# Patient Record
Sex: Female | Born: 1965
Health system: Southern US, Community
[De-identification: ages and names within clinical notes are randomized; demographics above are authoritative.]

## PROBLEM LIST (undated history)

## (undated) DIAGNOSIS — D689 Coagulation defect, unspecified: Secondary | ICD-10-CM

## (undated) DIAGNOSIS — E079 Disorder of thyroid, unspecified: Secondary | ICD-10-CM

## (undated) DIAGNOSIS — Z86010 Personal history of colonic polyps: Secondary | ICD-10-CM

## (undated) DIAGNOSIS — M48061 Spinal stenosis, lumbar region without neurogenic claudication: Secondary | ICD-10-CM

## (undated) DIAGNOSIS — D699 Hemorrhagic condition, unspecified: Secondary | ICD-10-CM

## (undated) DIAGNOSIS — K068 Other specified disorders of gingiva and edentulous alveolar ridge: Secondary | ICD-10-CM

## (undated) DIAGNOSIS — D239 Other benign neoplasm of skin, unspecified: Secondary | ICD-10-CM

## (undated) DIAGNOSIS — Z9289 Personal history of other medical treatment: Secondary | ICD-10-CM

## (undated) DIAGNOSIS — Z800 Family history of malignant neoplasm of digestive organs: Secondary | ICD-10-CM

## (undated) DIAGNOSIS — R42 Dizziness and giddiness: Secondary | ICD-10-CM

## (undated) DIAGNOSIS — G2581 Restless legs syndrome: Secondary | ICD-10-CM

## (undated) DIAGNOSIS — D691 Qualitative platelet defects: Secondary | ICD-10-CM

## (undated) DIAGNOSIS — Z803 Family history of malignant neoplasm of breast: Secondary | ICD-10-CM

## (undated) HISTORY — DX: Qualitative platelet defects: D69.1

## (undated) HISTORY — DX: Other benign neoplasm of skin, unspecified: D23.9

## (undated) HISTORY — DX: Personal history of other medical treatment: Z92.89

## (undated) HISTORY — PX: BREAST BIOPSY: SHX20

## (undated) HISTORY — DX: Other specified disorders of gingiva and edentulous alveolar ridge: K06.8

## (undated) HISTORY — DX: Dizziness and giddiness: R42

## (undated) HISTORY — DX: Disorder of thyroid, unspecified: E07.9

## (undated) HISTORY — DX: Hemorrhagic condition, unspecified: D69.9

## (undated) HISTORY — DX: Family history of malignant neoplasm of breast: Z80.3

## (undated) HISTORY — DX: Personal history of colonic polyps: Z86.010

## (undated) HISTORY — DX: Restless legs syndrome: G25.81

## (undated) HISTORY — DX: Coagulation defect, unspecified: D68.9

## (undated) HISTORY — DX: Spinal stenosis, lumbar region without neurogenic claudication: M48.061

## (undated) HISTORY — DX: Family history of malignant neoplasm of digestive organs: Z80.0

---

## 1993-11-04 HISTORY — PX: NASAL FRACTURE SURGERY: SHX718

## 2000-04-15 ENCOUNTER — Other Ambulatory Visit: Admission: RE | Admit: 2000-04-15 | Discharge: 2000-04-15 | Payer: Self-pay | Admitting: Family Medicine

## 2000-11-11 ENCOUNTER — Other Ambulatory Visit: Admission: RE | Admit: 2000-11-11 | Discharge: 2000-11-11 | Payer: Self-pay | Admitting: Obstetrics & Gynecology

## 2000-11-13 ENCOUNTER — Encounter: Payer: Self-pay | Admitting: Obstetrics & Gynecology

## 2000-11-13 ENCOUNTER — Encounter: Admission: RE | Admit: 2000-11-13 | Discharge: 2000-11-13 | Payer: Self-pay | Admitting: Obstetrics & Gynecology

## 2001-03-13 ENCOUNTER — Encounter: Payer: Self-pay | Admitting: General Surgery

## 2001-03-13 ENCOUNTER — Encounter: Admission: RE | Admit: 2001-03-13 | Discharge: 2001-03-13 | Payer: Self-pay | Admitting: Unknown Physician Specialty

## 2001-04-01 ENCOUNTER — Ambulatory Visit (HOSPITAL_COMMUNITY): Admission: RE | Admit: 2001-04-01 | Discharge: 2001-04-01 | Payer: Self-pay | Admitting: General Surgery

## 2001-04-01 ENCOUNTER — Encounter (INDEPENDENT_AMBULATORY_CARE_PROVIDER_SITE_OTHER): Payer: Self-pay | Admitting: Specialist

## 2002-02-03 ENCOUNTER — Other Ambulatory Visit: Admission: RE | Admit: 2002-02-03 | Discharge: 2002-02-03 | Payer: Self-pay | Admitting: Obstetrics & Gynecology

## 2002-05-28 ENCOUNTER — Observation Stay (HOSPITAL_COMMUNITY): Admission: RE | Admit: 2002-05-28 | Discharge: 2002-05-29 | Payer: Self-pay | Admitting: Obstetrics & Gynecology

## 2002-05-28 ENCOUNTER — Encounter (INDEPENDENT_AMBULATORY_CARE_PROVIDER_SITE_OTHER): Payer: Self-pay

## 2003-11-22 ENCOUNTER — Emergency Department (HOSPITAL_COMMUNITY): Admission: EM | Admit: 2003-11-22 | Discharge: 2003-11-22 | Payer: Self-pay | Admitting: Emergency Medicine

## 2004-11-04 HISTORY — PX: PARTIAL HYSTERECTOMY: SHX80

## 2005-07-22 ENCOUNTER — Encounter: Admission: RE | Admit: 2005-07-22 | Discharge: 2005-07-22 | Payer: Self-pay | Admitting: Family Medicine

## 2005-07-22 ENCOUNTER — Encounter (INDEPENDENT_AMBULATORY_CARE_PROVIDER_SITE_OTHER): Payer: Self-pay | Admitting: *Deleted

## 2005-11-04 HISTORY — PX: BREAST SURGERY: SHX581

## 2006-12-31 ENCOUNTER — Emergency Department (HOSPITAL_COMMUNITY): Admission: EM | Admit: 2006-12-31 | Discharge: 2006-12-31 | Payer: Self-pay | Admitting: Emergency Medicine

## 2009-04-09 ENCOUNTER — Emergency Department (HOSPITAL_COMMUNITY): Admission: EM | Admit: 2009-04-09 | Discharge: 2009-04-09 | Payer: Self-pay | Admitting: Emergency Medicine

## 2009-10-20 ENCOUNTER — Encounter: Admission: RE | Admit: 2009-10-20 | Discharge: 2009-10-20 | Payer: Self-pay | Admitting: Family Medicine

## 2010-11-04 HISTORY — PX: OTHER SURGICAL HISTORY: SHX169

## 2011-02-11 LAB — POCT URINALYSIS DIP (DEVICE)
Bilirubin Urine: NEGATIVE
Glucose, UA: NEGATIVE mg/dL
Specific Gravity, Urine: <=1.005 (ref 1.005–1.030)
Urobilinogen, UA: 0.2 mg/dL (ref 0.0–1.0)
pH: 6 (ref 5.0–8.0)

## 2011-03-22 NOTE — H&P (Signed)
[facility]  Patient:    Deanna [name], Deanna [name] Visit Number: [id number] MRN: [id number]          Service Type: Attending:  Caralyn Guile. Arlyce Dice, M.D. Dictated by:   Caralyn Guile Arlyce Dice, M.D. Adm. Date:  05/28/02                           History and Physical  CHIEF COMPLAINT:              Menometrorrhagia and chronic lower abdominal pain.  HISTORY OF PRESENT ILLNESS:   This is a 45 year old, gravida 2, para 2 female who has a year history of abnormal uterine bleeding and chronic lower abdominal pain.  The patient was treated with birth control pills and this failed to reduce the pain or flow that she ______  In addition, the patient complains of lower abdominal pain that is sometimes worse after intercourse. An ultrasound was performed which showed no pelvic pathology.  The options for treatment were discussed with the patient and they included cryoablation, D&C, a hysteroscopy.  The patient also requests permanent sterilization.  PAST MEDICAL HISTORY:         She has had nasal surgery in 1994 which she hemorrhaged and required three blood transfusions.  She has a history of postpartum thyroiditis.  She is euthyroid at the present time with a thyroid TSH of 1.84 in March 2003.  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  She also has had cryosurgery to her cervix for mild dysplasia and vaginal discharge.  ALLERGIES:                    The patient has no known allergies.  FAMILY HISTORY:               Negative for breast, ovarian, and colon cancer. Her mother does have angina, congestive heart failure, and hypertension.  SOCIAL HISTORY:               She does not smoke cigarettes and does not use any recreational drugs.  REVIEW OF SYSTEMS:            Negative in detail.  PHYSICAL EXAMINATION:  GENERAL:                      She is 5 feet, 5 inches tall, 148 pounds.  HEENT:                        Her ears, nose, and throat are  normal.  NECK:                         Supple without thyromegaly.  BREASTS:                      Without masses.  HEART:                        Regular sinus rhythm without murmurs or gallops.  LUNGS:                        Clear to auscultation and percussion.  ABDOMEN:                      Soft without hepatosplenomegaly.  There was no  lymphadenopathy.  PELVIC:                       Her vagina and vulva were normal.  Cervix appeared normal.  Uterus was tender, top normal size.  Her adnexa were without masses.  IMPRESSION:                   This is a patient with chronic low abdominal pain which is causing her some dyspareunia.  She is a patient who desires permanent sterilization and she is also complaining of menorrhagia and dysmenorrhea that is not responsive to oral contraceptives.  The options were discussed with the patient and the decision was made to proceed with a diagnostic laparoscopy to rule out adnexal disease and then to proceed with a transvaginal hysterectomy for the treatment of her menorrhagia and dysmenorrhea. Dictated by:   Caralyn Guile Arlyce Dice, M.D. Attending:  Caralyn Guile. Arlyce Dice, M.D. DD:  05/26/02 TD:  05/26/02 Job: [id number]

## 2011-03-22 NOTE — Op Note (Signed)
[facility]  Patient:    Deanna [name], Deanna [name]                    MRN: [id number] Proc. Date: 04/01/01 Adm. Date:  [id number] Disc. Date: [id number] Attending:  Caleen Essex                           Operative Report  PREOPERATIVE DIAGNOSIS:  Right axillary lymphadenopathy.  POSTOPERATIVE DIAGNOSIS:  Right axillary lymphadenopathy.  OPERATION:  Excision biopsy of right axillary lymph nodes.  SURGEON:  Chevis Pretty, M.D.  ANESTHESIA:  Local with IV sedation.  DESCRIPTION OF PROCEDURE:  After informed consent was obtained, the patient was brought to the operating room and placed in the supine position on the operating table.  After adequate sedation, the patients right axilla was prepped with Betadine and draped in the usual sterile manner.  The lymph nodes in question were palpable in the axilla.  Overlying this area, the axilla was infiltrated with 0.25% Marcaine.  Once the patient was anesthetized adequately, a small transverse incision was made overlying the palpable axillary nodes.  Dissection was carried out with Bovie electrocautery as well as with blunt dissection with the hemostat until the nodes in question were identified.  The nodes were removed using a combination of sharp dissection with the Bovie electrocautery and serial placement of hemoclips on the lymphatic channels.  Once this was complete and the axillary nodes in questions were removed, the wound was examined for hemostasis.  Several small bleeding points required electrocautery for hemostasis.  The wound was then closed with a deep layer of interrupted 3-0 Vicryl stitches, and the skin as closed with a running 4-0 Monocryl subcuticular stitch.  Benzoin, Steri-Strips, and a sterile dressing were applied.  The patient tolerated the procedure well.  At the end of the case, all sponge, needle, and instrument counts were correct.  The patient was then taken to the recovery room in stable  condition. DD:  04/07/01 TD:  04/08/01 Job: [id number]

## 2011-03-22 NOTE — Op Note (Signed)
Deanna, [name]                       ACCOUNT NO.:  [id number]   MEDICAL RECORD NO.:  [id number]                   PATIENT TYPE:  INP   LOCATION:  9312                                 FACILITY:  [facility]   PHYSICIAN:  RICHARD D. KAPLAN                   DATE OF BIRTH:  1966/02/04   DATE OF PROCEDURE:  05/28/2002  DATE OF DISCHARGE:  05/29/2002                                 OPERATIVE REPORT   PREOPERATIVE DIAGNOSES:  1. Dysmenorrhea.  2. Menorrhagia.  3. Chronic lower abdominal pain.   POSTOPERATIVE DIAGNOSES:  1. Dysmenorrhea.  2. Menorrhagia.  3. Chronic lower abdominal pain.   PROCEDURES:  1. Diagnostic laparoscopy.  2. Transvaginal hysterectomy.   SURGEON:  Richard D. Arlyce Dice, M.D.   ASSISTANT:  Luvenia Redden, M.D.   ANESTHESIA:  General endotracheal .   ESTIMATED BLOOD LOSS:  100 cc.   FINDINGS:  Normal-appearing tubes and ovaries.  No pelvic adhesions.  No  signs of endometriosis.  Normal-appearing appendix.   INDICATIONS:  This is a 45 year old gravida 2, para 2 who, for the last  year, has been complaining of increasingly heavy periods with pain and  occasional lower abdominal pain with dyspareunia.  Evaluation in the office  revealed an ultrasound that showed no pathology.  She was tried on oral  contraceptives which did not succeed in stopping her heavy and painful  periods.  Options were discussed with the patient, including cryoablation of  the cervix, cryoablation of the uterus, and hysteroscopy.  In addition to  these problems, the patient also wanted permanent sterilization.  It was  felt that with her history that the most reasonable approach was to proceed  with hysterectomy for the treatment of her dysmenorrhea and menorrhagia and  to do a diagnostic laparoscopy to ensure that there was no adnexal  pathology.   DESCRIPTION OF PROCEDURE:  The patient was taken to the operating room and  placed in the dorsal lithotomy position after general  endotracheal  anesthesia had been induced.  The abdomen, perineum, and vagina were prepped  and draped in sterile fashion.  An incision was made in the base of the  umbilicus, a Veress needle was introduced into the peritoneal cavity, and a  pneumoperitoneum was created.  A 5 mm trocar was introduced and a 5 mm  laparoscope was introduced into the pneumoperitoneum.  A suprapubic stab  wound was made and another 5 mm port was placed. Through this, a wand was  placed to help manipulate the pelvic organs to view the pelvis.  This was  accomplished and the pelvis was noted to be free of pathology.  The gas was  allowed to escape.  The instruments were left in situ.   The patient was repositioned and prepared for vaginal hysterectomy.  A  weighted speculum was placed in the vagina.  The anterior lip of the cervix  was grasped  with a Jacobs tenaculum and the paracervical tissues were  infiltrated with dilute epinephrine and Marcaine solution.  The mucosa  around the cervix was incised and the mitral valve was dissected free.  The  cul-de-sac was entered posteriorly and the uterosacral ligaments were  clamped, cut, and ligated bilaterally.  The bases of the cardinal ligaments  were then clamped with a ligature clamp, cauterized, and cut.  The anterior  cul-de-sac was then entered and the remaining cardinal ligaments and the  uterine ligaments were clamped with a LigaSure clamp, cauterized, and cut.  The base of the broad ligament was cauterized and cut and then the uterus  was delivered. The remainder of the utero-ovarian anastomosis and round  ligament were progressively clamped, cauterized, and cut with a LigaSure  clamp until the specimen was freed and removed.  Hemostasis was noted to be  present.  The posterior cuff was then closed with a running interlocking #0  Vicryl suture.  The peritoneum was closed with a pursestring suture.  The  uterosacral ligaments were closed together to  obliterate the cul-de-sac and  then tied across the midline.  The anterior vaginal cuff was closed with  interrupted and figure-of-eight Vicryl suture.   At the end of the procedure, the laparoscope was then used to ensure that  the pedicles were dry and they were dry.  The procedure was then terminated.  The laparoscopic incisions were closed with Dermabond.                                               Mickel Baas    RK/MEDQ  D:  05/28/2002  T:  06/03/2002  Job:  [id number]

## 2011-03-22 NOTE — Discharge Summary (Signed)
Deanna [name], Deanna [name]                       ACCOUNT NO.:  [id number]   MEDICAL RECORD NO.:  [id number]                   PATIENT TYPE:  OBV   LOCATION:  9312                                 FACILITY:  [facility]   PHYSICIAN:  Ilda Mori, MD                  DATE OF BIRTH:  1966/07/08   DATE OF ADMISSION:  05/28/2002  DATE OF DISCHARGE:  05/29/2002                                 DISCHARGE SUMMARY   FINAL DIAGNOSES:  1. Dyspareunia.  2. Dysmenorrhea.  3. Menorrhagia.   SECONDARY DIAGNOSES:  None.   PROCEDURE:  1. Transvaginal hysterectomy.  2. Diagnostic laparoscopy.   COMPLICATIONS:  None.   CONDITION ON DISCHARGE:  Improved.   HISTORY OF PRESENT ILLNESS:  This is a 45 year old gravida 2, para 2 with an  approximately 10-12 month history of increasingly severe heavy periods with  pain with her periods and chronic low abdominal pain associated with  intercourse.  She was evaluated in the office where ultrasound was performed  which showed no definite pathology.  She was __________ oral contraceptives  which did not succeed in alleviating her symptoms.  Because of the  persistence of the symptoms and the fact that medical therapy was  unsuccessful, decision was made to proceed with diagnostic laparoscopy to  rule out adnexal disease and then transvaginal hysterectomy for the  treatment of her menorrhagia and dysmenorrhea.  She was taken to the  operating room on the day of admission where a diagnostic laparoscopy was  performed which revealed no adnexal pathology.  A transvaginal hysterectomy  was then performed without complication.  The patient was observed for 24  hours postoperatively and then discharged to home.  She was discharged home  on a regular diet, told to limit her activity.  She was given Tylox 25  tablets to take one to two q.4h. for pain and asked to return to the office  in two weeks for follow-up evaluation.   LABORATORY DATA:  Admission hemoglobin of  13.4 and a white count of 7200.  Postoperatively her hemoglobin was 10.1 with a white count of 7300.  Prior  to discharge on postoperative day one her hemoglobin was repeated 12 hours  later and remained stable at 10.3 with a white count of 6000.  Routine  chemistries were performed which were all normal.  Serum pregnancy test was  negative.  A urinalysis was benign.  Pathology on the uterus revealed  nonspecific chronic erosive cervicitis, benign secretory endometrium,  myometria and serosa with no pathologic abnormalities.                                               Ilda Mori, MD    RK/MEDQ  D:  06/22/2002  T:  06/22/2002  Job:  [id number]

## 2013-02-11 ENCOUNTER — Ambulatory Visit (INDEPENDENT_AMBULATORY_CARE_PROVIDER_SITE_OTHER): Payer: 59 | Admitting: Family Medicine

## 2013-02-11 VITALS — BP 147/81 | HR 81 | Temp 99.2°F | Resp 16 | Ht 64.5 in | Wt 207.0 lb

## 2013-02-11 DIAGNOSIS — R269 Unspecified abnormalities of gait and mobility: Secondary | ICD-10-CM

## 2013-02-11 DIAGNOSIS — K068 Other specified disorders of gingiva and edentulous alveolar ridge: Secondary | ICD-10-CM

## 2013-02-11 DIAGNOSIS — K055 Other periodontal diseases: Secondary | ICD-10-CM

## 2013-02-11 DIAGNOSIS — R002 Palpitations: Secondary | ICD-10-CM

## 2013-02-11 DIAGNOSIS — R5381 Other malaise: Secondary | ICD-10-CM

## 2013-02-11 DIAGNOSIS — R2681 Unsteadiness on feet: Secondary | ICD-10-CM

## 2013-02-11 DIAGNOSIS — R5383 Other fatigue: Secondary | ICD-10-CM

## 2013-02-11 LAB — POCT URINALYSIS DIPSTICK
Bilirubin, UA: NEGATIVE
Ketones, UA: NEGATIVE
Nitrite, UA: NEGATIVE
Urobilinogen, UA: 0.2
pH, UA: 7

## 2013-02-11 LAB — POCT CBC
Granulocyte percent: 73.1 %G (ref 37–80)
Hemoglobin: 11.7 g/dL — AB (ref 12.2–16.2)
MCV: 92.1 fL (ref 80–97)
MID (cbc): 0.6 (ref 0–0.9)
POC Granulocyte: 6.7 (ref 2–6.9)
POC LYMPH PERCENT: 19.9 %L (ref 10–50)
Platelet Count, POC: 306 10*3/uL (ref 142–424)
RDW, POC: 13.4 %
WBC: 9.2 10*3/uL (ref 4.6–10.2)

## 2013-02-11 NOTE — Progress Notes (Signed)
[address]   [place], [place]  [address]   [phone]  Subjective:    Patient ID: Deanna [name], female    DOB: 1966-02-21, 47 y.o.   MRN: [id number]  HPI This 47 y.o. female presents for evaluation of the following:  1.  Fatigue and malaise:  +palpitatoins; history of thyroid disease post-partum.  Onset several months.  Intermittent chest pressure x 5-6 times per week; +palpitations.  No dizziness but unsteadiness.  Unbalanced few times per week.  Short duration.  No SOB; no coughing; not sure if snores.  Sleeping well but non-refreshed sleeping.  Major stressors over past five years.  Lost sister to colon cancer in past few years; one month ago, mom diagnosed with colon cancer.  In laws with Parkinsons.   Son going to college in fall.  Emotionally stable.  No anxiety.  Strong Faith.  No GI symptoms; no bloody stools other than rare blood; s/p colonoscopy 5-6 years; +hemorrhoids; due for repeat colonoscopy and plans to schedule.  No decreased appetite.  +Narrow stools but chronic.  Overdue for mammogram.  No changes in skin or hair.  Very sun sensitive.  Last labs years ago.  Has gained 20 pounds in past year.  2.  Excessive gum bleeding: followed by dentist every three months due to bleeding; persistent issue; tried antibiotics, antivirals, antifungals.  No improvement.  +systemic problem?  3. Toe red R first: woke up from sleep; onset this morning.    Review of Systems  Constitutional: Positive for fatigue and unexpected weight change. Negative for fever, chills and diaphoresis.  Respiratory: Negative for shortness of breath, wheezing and stridor.   Cardiovascular: Positive for chest pain and palpitations. Negative for leg swelling.  Gastrointestinal: Negative for nausea, vomiting, abdominal pain, diarrhea and constipation.  Endocrine: Negative for cold intolerance, heat intolerance, polydipsia, polyphagia and polyuria.  Musculoskeletal: Negative for myalgias, back pain, joint  swelling, arthralgias and gait problem.  Neurological: Negative for dizziness, tremors, syncope, facial asymmetry, weakness, light-headedness, numbness and headaches.  Psychiatric/Behavioral: Negative for sleep disturbance and dysphoric mood. The patient is not nervous/anxious.         Past Medical History  Diagnosis Date  . Thyroid disease     Post-partum  . Dysplastic nevus     Past Surgical History  Procedure Laterality Date  . Partial hysterectomy    . Breast surgery      removal of a melanoma  . Resection dysplastic nevus      Prior to Admission medications   Not on File    No Known Allergies  History   Social History  . Marital Status: Single    Spouse Name: N/A    Number of Children: N/A  . Years of Education: N/A   Occupational History  . Not on file.   Social History Main Topics  . Smoking status: Never Smoker   . Smokeless tobacco: Not on file  . Alcohol Use: No  . Drug Use: No  . Sexually Active: Yes    Birth Control/ Protection: None   Other Topics Concern  . Not on file   Social History Narrative   Employment: Print production planner for [employer]    Family History  Problem Relation Age of Onset  . Colon cancer Mother   . Diabetes Mother   . Hypertension Mother   . Cancer Mother     colon  . Stroke Father   . Hypertension Brother   . Thyroid disease Sister   .  Colon cancer Sister   . Cancer Sister     colon cancer    Objective:   Physical Exam  Nursing note and vitals reviewed. Constitutional: She is oriented to person, place, and time. She appears well-developed and well-nourished. No distress.  HENT:  Head: Normocephalic and atraumatic.  Right Ear: External ear normal.  Left Ear: External ear normal.  Nose: Nose normal.  Mouth/Throat: Oropharynx is clear and moist.  Eyes: Conjunctivae and EOM are normal. Pupils are equal, round, and reactive to light.  Neck: Normal range of motion. Neck supple. No JVD present. No  thyromegaly present.  Cardiovascular: Normal rate, regular rhythm, normal heart sounds and intact distal pulses.  Exam reveals no gallop and no friction rub.   No murmur heard. Pulmonary/Chest: Effort normal and breath sounds normal. She has no wheezes. She has no rales.  Abdominal: Soft. Bowel sounds are normal. There is no tenderness. There is no rebound and no guarding.  Musculoskeletal:       Right shoulder: Normal.       Left shoulder: Normal.       Cervical back: Normal.       Thoracic back: Normal.       Lumbar back: Normal.  Lymphadenopathy:    She has no cervical adenopathy.  Neurological: She is alert and oriented to person, place, and time. No cranial nerve deficit. She exhibits normal muscle tone. She displays a negative Romberg sign. Coordination and gait normal.  Skin: No rash noted. She is not diaphoretic.  R first toe:  Erythema along lateral nail edge; no fluctuants; mildly tender.  Psychiatric: She has a normal mood and affect. Her behavior is normal. Judgment and thought content normal.   Results for orders placed in visit on 02/11/13  POCT CBC      Result Value Range   WBC 9.2  4.6 - 10.2 K/uL   Lymph, poc 1.8  0.6 - 3.4   POC LYMPH PERCENT 19.9  10 - 50 %L   MID (cbc) 0.6  0 - 0.9   POC MID % 7.0  0 - 12 %M   POC Granulocyte 6.7  2 - 6.9   Granulocyte percent 73.1  37 - 80 %G   RBC 4.11  4.04 - 5.48 M/uL   Hemoglobin 11.7 (*) 12.2 - 16.2 g/dL   HCT, POC 16.1  09.6 - 47.9 %   MCV 92.1  80 - 97 fL   MCH, POC 28.5  27 - 31.2 pg   MCHC 30.9 (*) 31.8 - 35.4 g/dL   RDW, POC 04.5     Platelet Count, POC 306  142 - 424 K/uL   MPV 9.0  0 - 99.8 fL  POCT GLYCOSYLATED HEMOGLOBIN (HGB A1C)      Result Value Range   Hemoglobin A1C 4.8    POCT URINALYSIS DIPSTICK      Result Value Range   Color, UA yellow     Clarity, UA clear     Glucose, UA neg     Bilirubin, UA neg     Ketones, UA neg     Spec Grav, UA 1.020     Blood, UA small     pH, UA 7.0     Protein,  UA neg     Urobilinogen, UA 0.2     Nitrite, UA neg     Leukocytes, UA Negative     EKG:  NSR; no ST changes.     Assessment & Plan:  Other malaise and fatigue - Plan: POCT glycosylated hemoglobin (Hb A1C), POCT urinalysis dipstick, Comprehensive metabolic panel, TSH, Vitamin D 25 hydroxy, T4, Free  Palpitations - Plan: T4, Free, EKG 12-Lead, Ambulatory referral to Cardiology  Unsteadiness - Plan: Vitamin B12  Bleeding gums - Plan: POCT CBC, Protime-INR, APTT   1.  Malaise/fatigue:  New.  Onset in past several months; multiple stressors likely contributing.  Obtain baseline labs.   2. Palpitations:  New. With intermittent chest pain.    Obtain labs.  Stable EKG; refer to cardiology for Holter monitor, echo, stress testing. 3.  Unsteadiness:  New. Obtain labs.  Normal neurological exam in office. If persists or worsens, refer to neurology. 4.  Bleeding gums:  New.  Obtain coagulation panel.  No other sites of bleeding.   5.  Mild ingrown toenail: New. With improvement today; recommend soaks for 20 minutes daily to bid.   6. Anemia: Mild; colonoscopy due; repeat at follow-up in two months.

## 2013-02-11 NOTE — Patient Instructions (Addendum)
Other malaise and fatigue - Plan: POCT glycosylated hemoglobin (Hb A1C), POCT urinalysis dipstick, Comprehensive metabolic panel, TSH, Vitamin D 25 hydroxy, T4, Free  Palpitations - Plan: T4, Free, EKG 12-Lead, Ambulatory referral to Cardiology  Unsteadiness - Plan: Vitamin B12  Bleeding gums - Plan: POCT CBC, Protime-INR, APTT

## 2013-02-12 LAB — COMPREHENSIVE METABOLIC PANEL
Albumin: 4.5 g/dL (ref 3.5–5.2)
CO2: 27 mEq/L (ref 19–32)
Creat: 0.73 mg/dL (ref 0.50–1.10)
Glucose, Bld: 75 mg/dL (ref 70–99)
Sodium: 137 mEq/L (ref 135–145)
Total Bilirubin: 0.4 mg/dL (ref 0.3–1.2)
Total Protein: 7.5 g/dL (ref 6.0–8.3)

## 2013-02-12 LAB — VITAMIN B12: Vitamin B-12: 363 pg/mL (ref 211–911)

## 2013-02-12 LAB — T4, FREE: Free T4: 0.95 ng/dL (ref 0.80–1.80)

## 2013-02-13 LAB — VITAMIN D 25 HYDROXY (VIT D DEFICIENCY, FRACTURES): Vit D, 25-Hydroxy: 21 ng/mL — ABNORMAL LOW (ref 30–89)

## 2013-02-18 ENCOUNTER — Encounter: Payer: Self-pay | Admitting: Family Medicine

## 2013-03-04 ENCOUNTER — Encounter: Payer: Self-pay | Admitting: Cardiology

## 2013-03-04 ENCOUNTER — Encounter: Payer: Self-pay | Admitting: *Deleted

## 2013-03-04 DIAGNOSIS — D239 Other benign neoplasm of skin, unspecified: Secondary | ICD-10-CM | POA: Insufficient documentation

## 2013-03-04 DIAGNOSIS — E079 Disorder of thyroid, unspecified: Secondary | ICD-10-CM | POA: Insufficient documentation

## 2013-03-09 ENCOUNTER — Encounter: Payer: Self-pay | Admitting: Cardiology

## 2013-03-09 ENCOUNTER — Ambulatory Visit (INDEPENDENT_AMBULATORY_CARE_PROVIDER_SITE_OTHER): Payer: 59 | Admitting: Cardiology

## 2013-03-09 VITALS — BP 120/81 | HR 83 | Ht 64.5 in | Wt 209.0 lb

## 2013-03-09 DIAGNOSIS — R002 Palpitations: Secondary | ICD-10-CM

## 2013-03-09 DIAGNOSIS — R58 Hemorrhage, not elsewhere classified: Secondary | ICD-10-CM | POA: Insufficient documentation

## 2013-03-09 NOTE — Patient Instructions (Addendum)
Your physician recommends that you schedule a follow-up appointment in: 6 WEEKS WITH DR CRENSHAW  Your physician has requested that you have an echocardiogram. Echocardiography is a painless test that uses sound waves to create images of your heart. It provides your doctor with information about the size and shape of your heart and how well your heart's chambers and valves are working. This procedure takes approximately one hour. There are no restrictions for this procedure.   Your physician has recommended that you wear an event monitor. Event monitors are medical devices that record the heart's electrical activity. Doctors most often us these monitors to diagnose arrhythmias. Arrhythmias are problems with the speed or rhythm of the heartbeat. The monitor is a small, portable device. You can wear one while you do your normal daily activities. This is usually used to diagnose what is causing palpitations/syncope (passing out).    

## 2013-03-09 NOTE — Assessment & Plan Note (Signed)
Plan echocardiogram to assess LV function and CardioNet. She may require low-dose beta blockade the future. Note TSH is normal.

## 2013-03-09 NOTE — Progress Notes (Signed)
  HPI: 47 year old female for evaluation of palpitations. Laboratories in April of 2014 showed a normal potassium and normal TSH. Patient has had intermittent palpitations for approximately one year. They are described as a brief flutter occasionally lasting up to 15 seconds. She otherwise denies dyspnea on exertion, orthopnea, PND, pedal edema, exertional chest pain. She occasionally has mild dizziness but has not had syncope.  No current outpatient prescriptions on file.   No current facility-administered medications for this visit.    No Known Allergies  Past Medical History  Diagnosis Date  . Thyroid disease     Post-partum  . Dysplastic nevus     Past Surgical History  Procedure Laterality Date  . Partial hysterectomy    . Breast surgery      removal of a melanoma  . Resection dysplastic nevus    . Nasal fracture surgery      History   Social History  . Marital Status: Married    Spouse Name: N/A    Number of Children: 2  . Years of Education: N/A   Occupational History  .      Engineer, manufacturing for hospitalist   Social History Main Topics  . Smoking status: Never Smoker   . Smokeless tobacco: Not on file  . Alcohol Use: No  . Drug Use: No  . Sexually Active: Yes    Birth Control/ Protection: None   Other Topics Concern  . Not on file   Social History Narrative   Employment: Print production planner for [employer]    Family History  Problem Relation Age of Onset  . Colon cancer Mother   . Diabetes Mother   . Hypertension Mother   . Cancer Mother     colon  . Stroke Father   . Hypertension Brother   . Thyroid disease Sister   . Colon cancer Sister   . Cancer Sister     colon cancer    ROS: some difficulties with bleeding gums and rare hematochezia but no fevers or chills, productive cough, hemoptysis, dysphasia, odynophagia, melena, dysuria, hematuria, rash, seizure activity, orthopnea, PND, pedal edema, claudication. Remaining systems are  negative.  Physical Exam:   Blood pressure 120/81, pulse 83, height 5' 4.5" (1.638 m), weight 209 lb (94.802 kg).  General:  Well developed/well nourished in NAD Skin warm/dry Patient not depressed No peripheral clubbing Back-normal HEENT-normal/normal eyelids Neck supple/normal carotid upstroke bilaterally; no bruits; no JVD; no thyromegaly chest - CTA/ normal expansion CV - RRR/normal S1 and S2; no murmurs, rubs or gallops;  PMI nondisplaced Abdomen -NT/ND, no HSM, no mass, + bowel sounds, no bruit 2+ femoral pulses, no bruits Ext-no edema, chords, 2+ DP Neuro-grossly nonfocal  ECG 02/12/2012-sinus rhythm with no ST changes.

## 2013-03-09 NOTE — Assessment & Plan Note (Signed)
Patient with occasional bleeding from her gums. Recent PTT mildly elevated. Also with history of bleeding following surgery. Platelet count is normal. She may require hematology evaluation in the future. I will leave this to her primary care.

## 2013-03-11 NOTE — Progress Notes (Signed)
Follow up appt made for June 9th with Dr. Katrinka Blazing.

## 2013-03-12 ENCOUNTER — Ambulatory Visit (HOSPITAL_COMMUNITY): Payer: 59 | Attending: Cardiology | Admitting: Radiology

## 2013-03-12 DIAGNOSIS — R079 Chest pain, unspecified: Secondary | ICD-10-CM | POA: Insufficient documentation

## 2013-03-12 DIAGNOSIS — E669 Obesity, unspecified: Secondary | ICD-10-CM | POA: Insufficient documentation

## 2013-03-12 DIAGNOSIS — R002 Palpitations: Secondary | ICD-10-CM | POA: Insufficient documentation

## 2013-03-12 NOTE — Progress Notes (Signed)
Echocardiogram performed.  

## 2013-03-15 ENCOUNTER — Encounter (INDEPENDENT_AMBULATORY_CARE_PROVIDER_SITE_OTHER): Payer: 59

## 2013-03-15 ENCOUNTER — Telehealth: Payer: Self-pay | Admitting: *Deleted

## 2013-03-15 DIAGNOSIS — R002 Palpitations: Secondary | ICD-10-CM

## 2013-03-15 NOTE — Telephone Encounter (Signed)
21 day event monitor placed on Pt 03/15/13 TK

## 2013-03-16 ENCOUNTER — Telehealth: Payer: Self-pay | Admitting: Cardiology

## 2013-03-16 NOTE — Telephone Encounter (Signed)
Spoke with pt, aware of echo results. 

## 2013-03-16 NOTE — Telephone Encounter (Signed)
New Problem:    Patient called in returning your call. Please call back. 

## 2013-04-07 ENCOUNTER — Telehealth: Payer: Self-pay | Admitting: *Deleted

## 2013-04-07 NOTE — Telephone Encounter (Signed)
Left message of results for pt, monitor reviewed by dr Jens Som shows sinus with rare PAC and PVC. Pt to call with questions.

## 2013-04-12 ENCOUNTER — Ambulatory Visit (INDEPENDENT_AMBULATORY_CARE_PROVIDER_SITE_OTHER): Payer: 59 | Admitting: Family Medicine

## 2013-04-12 ENCOUNTER — Encounter: Payer: Self-pay | Admitting: Family Medicine

## 2013-04-12 VITALS — BP 106/71 | HR 61 | Temp 98.2°F | Resp 16 | Ht 65.0 in | Wt 205.0 lb

## 2013-04-12 DIAGNOSIS — R5383 Other fatigue: Secondary | ICD-10-CM

## 2013-04-12 DIAGNOSIS — K068 Other specified disorders of gingiva and edentulous alveolar ridge: Secondary | ICD-10-CM

## 2013-04-12 DIAGNOSIS — Z1239 Encounter for other screening for malignant neoplasm of breast: Secondary | ICD-10-CM | POA: Insufficient documentation

## 2013-04-12 DIAGNOSIS — Z800 Family history of malignant neoplasm of digestive organs: Secondary | ICD-10-CM

## 2013-04-12 DIAGNOSIS — D649 Anemia, unspecified: Secondary | ICD-10-CM | POA: Insufficient documentation

## 2013-04-12 DIAGNOSIS — R002 Palpitations: Secondary | ICD-10-CM

## 2013-04-12 DIAGNOSIS — R5381 Other malaise: Secondary | ICD-10-CM

## 2013-04-12 DIAGNOSIS — K055 Other periodontal diseases: Secondary | ICD-10-CM | POA: Insufficient documentation

## 2013-04-12 DIAGNOSIS — E559 Vitamin D deficiency, unspecified: Secondary | ICD-10-CM | POA: Insufficient documentation

## 2013-04-12 DIAGNOSIS — E079 Disorder of thyroid, unspecified: Secondary | ICD-10-CM

## 2013-04-12 LAB — COMPREHENSIVE METABOLIC PANEL
AST: 14 U/L (ref 0–37)
CO2: 26 mEq/L (ref 19–32)
Calcium: 9.1 mg/dL (ref 8.4–10.5)
Chloride: 106 mEq/L (ref 96–112)

## 2013-04-12 LAB — IRON: Iron: 65 ug/dL (ref 42–145)

## 2013-04-12 LAB — CBC WITH DIFFERENTIAL/PLATELET
Basophils Absolute: 0 10*3/uL (ref 0.0–0.1)
Basophils Relative: 0 % (ref 0–1)
HCT: 36.7 % (ref 36.0–46.0)
Hemoglobin: 12.5 g/dL (ref 12.0–15.0)
MCH: 29.8 pg (ref 26.0–34.0)
Monocytes Absolute: 0.4 10*3/uL (ref 0.1–1.0)
Neutrophils Relative %: 77 % (ref 43–77)
Platelets: 325 10*3/uL (ref 150–400)
RBC: 4.2 MIL/uL (ref 3.87–5.11)
RDW: 13.4 % (ref 11.5–15.5)

## 2013-04-12 LAB — PROTIME-INR: Prothrombin Time: 13.4 seconds (ref 11.6–15.2)

## 2013-04-12 LAB — APTT: aPTT: 33 seconds (ref 24–37)

## 2013-04-12 NOTE — Assessment & Plan Note (Signed)
Refer for mammogram.  

## 2013-04-12 NOTE — Assessment & Plan Note (Signed)
Persistent.  Coagulation studies borderline normal at last visit.  History of post-operative bleeding after nasal surgery; history of hysterectomy for DUB.  Refer to hematology to rule out pathology.

## 2013-04-12 NOTE — Assessment & Plan Note (Signed)
New.  Tolerating increased Vitamin D supplementation; repeat labs.

## 2013-04-12 NOTE — Assessment & Plan Note (Signed)
Persistent; no improvement with increased Vitamin D supplementation.  Mildly anemic yet not severe enough to be etiology to fatigue.  Undergo hematology and GI consultations. If fatigue persists at follow-up, refer for sleep study.

## 2013-04-12 NOTE — Assessment & Plan Note (Signed)
Resolved; TSH normal at last visit.

## 2013-04-12 NOTE — Assessment & Plan Note (Signed)
Stable. S/p cardiology consultation; to follow-up next week.

## 2013-04-12 NOTE — Progress Notes (Signed)
[address]   [place], [place]  [address]   [phone]  Subjective:    Patient ID: Deanna [name], female    DOB: 1966/08/08, 47 y.o.   MRN: [id number]  HPI This 47 y.o. female presents for two month follow-up:  1. Palpitations:  S/p cardiology consult by Crenshaw.  S/p 2D-echo and Holter monitor.  Follow-up with Crenshaw next week.    2.  Fatigue and malaise:  Vitamin D 4000 IU daily since last visit.  No improvement in energy level.  TSH normal.  Frequent nighttime awakening; mind racing; insomnia 2 nights per weke. Non-restorative sleep most nights; no dreaming.  No napping.  3. Anemia:  Present at last visit; past due for colonoscopy.  Life is very busy.  Pt to call for colonoscopy. S/p hysterectomy.  4. Bleeding gums:  Nose injury playing softball; did suffer with significant post-operative hemorrhage after nasal surgery.  Was admitted and required blood transfusion in 20 years ago.  S/p hysterectomy due to DUB.    5. Unsteadiness:  No recent issues; very mild equilibrium issues.  Occurs a few times per week.  6. Vitamin D deficiency: at last visit; increased daily vitamin D to 4000 units.  Review of Systems  Constitutional: Positive for fatigue. Negative for fever, chills, diaphoresis, activity change and appetite change.  HENT: Positive for dental problem.   Respiratory: Negative for shortness of breath.   Cardiovascular: Positive for palpitations. Negative for chest pain and leg swelling.  Gastrointestinal: Negative for nausea, vomiting, abdominal pain, diarrhea, constipation, blood in stool, abdominal distention, anal bleeding and rectal pain.  Skin: Negative for rash and wound.  Neurological: Negative for dizziness, syncope and light-headedness.  Hematological: Negative for adenopathy. Does not bruise/bleed easily.  Psychiatric/Behavioral: Positive for sleep disturbance. Negative for dysphoric mood. The patient is not nervous/anxious.     Past Medical History    Diagnosis Date  . Thyroid disease     Post-partum  . Dysplastic nevus     Past Surgical History  Procedure Laterality Date  . Partial hysterectomy    . Breast surgery      removal of a melanoma  . Resection dysplastic nevus    . Nasal fracture surgery      Prior to Admission medications   Medication Sig Start Date End Date Taking? Authorizing Provider  cholecalciferol (VITAMIN D) 1000 UNITS tablet Take 4,000 Units by mouth daily.    Yes Historical Provider, MD    No Known Allergies  History   Social History  . Marital Status: Married    Spouse Name: N/A    Number of Children: 2  . Years of Education: N/A   Occupational History  .      Engineer, manufacturing for hospitalist   Social History Main Topics  . Smoking status: Never Smoker   . Smokeless tobacco: Not on file  . Alcohol Use: No  . Drug Use: No  . Sexually Active: Yes    Birth Control/ Protection: None   Other Topics Concern  . Not on file   Social History Narrative   Employment: Print production planner for [employer]    Family History  Problem Relation Age of Onset  . Colon cancer Mother   . Diabetes Mother   . Hypertension Mother   . Cancer Mother     colon  . Stroke Father   . Hypertension Brother   . Thyroid disease Sister   . Colon cancer Sister   . Cancer Sister  colon cancer       Objective:   Physical Exam  Nursing note and vitals reviewed. Constitutional: She is oriented to person, place, and time. She appears well-developed and well-nourished. No distress.  HENT:  Mouth/Throat: Oropharynx is clear and moist.  Eyes: Conjunctivae are normal. Pupils are equal, round, and reactive to light.  Neck: Normal range of motion. Neck supple. No thyromegaly present.  Cardiovascular: Normal rate, regular rhythm, normal heart sounds and intact distal pulses.  Exam reveals no gallop and no friction rub.   No murmur heard. Pulmonary/Chest: Effort normal and breath sounds normal. She has no  wheezes. She has no rales.  Abdominal: Soft. Bowel sounds are normal. She exhibits no distension and no mass. There is no tenderness. There is no rebound and no guarding.  Lymphadenopathy:    She has no cervical adenopathy.  Neurological: She is alert and oriented to person, place, and time. She has normal strength. No cranial nerve deficit. She exhibits normal muscle tone. She displays a negative Romberg sign. Coordination and gait normal.  Skin: She is not diaphoretic.  Psychiatric: She has a normal mood and affect. Her behavior is normal. Judgment and thought content normal.       Assessment & Plan:  Anemia, unspecified - Plan: CBC with Differential, Iron, Ferritin  Other malaise and fatigue - Plan: Comprehensive metabolic panel, Vitamin D 25 hydroxy, Protime-INR, APTT  Other specified periodontal diseases - Plan: Comprehensive metabolic panel, Protime-INR, APTT  Breast cancer screening - Plan: MM Digital Screening  Bleeding gums - Plan: Ambulatory referral to Hematology  Family history of colon cancer requiring screening colonoscopy - Plan: Ambulatory referral to Gastroenterology

## 2013-04-12 NOTE — Assessment & Plan Note (Signed)
Refer for repeat colonoscopy

## 2013-04-12 NOTE — Assessment & Plan Note (Signed)
New.  Repeat today with iron studies.  Overdue for repeat colonoscopy thus referral made to GI.

## 2013-04-13 LAB — VITAMIN D 25 HYDROXY (VIT D DEFICIENCY, FRACTURES): Vit D, 25-Hydroxy: 37 ng/mL (ref 30–89)

## 2013-04-14 ENCOUNTER — Encounter: Payer: Self-pay | Admitting: Internal Medicine

## 2013-04-16 ENCOUNTER — Encounter: Payer: Self-pay | Admitting: Family Medicine

## 2013-04-22 ENCOUNTER — Encounter: Payer: Self-pay | Admitting: Cardiology

## 2013-04-22 ENCOUNTER — Ambulatory Visit (INDEPENDENT_AMBULATORY_CARE_PROVIDER_SITE_OTHER): Payer: 59 | Admitting: Cardiology

## 2013-04-22 VITALS — BP 118/80 | HR 64 | Ht 65.0 in | Wt 206.8 lb

## 2013-04-22 DIAGNOSIS — R002 Palpitations: Secondary | ICD-10-CM

## 2013-04-22 MED ORDER — METOPROLOL SUCCINATE ER 25 MG PO TB24: 25.0000 mg | ORAL_TABLET | Freq: Every day | ORAL | Status: DC

## 2013-04-22 NOTE — Assessment & Plan Note (Signed)
Patient with occasional bleeding from her gums. Previous PTT mildly elevated. Also with history of bleeding following surgery. Platelet count is normal. She is scheduled to see hematology for this issue.

## 2013-04-22 NOTE — Progress Notes (Signed)
   HPI: Pleasant female for fu of palpitations. Laboratories in April of 2014 showed a normal potassium and normal TSH. Echocardiogram in May of 2014 showed normal LV function. Event monitor in May 2014 showed sinus rhythm with PACs and PVCs. Since I last saw her, there is no dyspnea, chest pain or syncope. She continues to have occasional palpitations described as a brief flutter. Not sustained.   Current Outpatient Prescriptions  Medication Sig Dispense Refill  . cholecalciferol (VITAMIN D) 1000 UNITS tablet Take 4,000 Units by mouth daily.        No current facility-administered medications for this visit.     Past Medical History  Diagnosis Date  . Thyroid disease     Post-partum  . Dysplastic nevus     Past Surgical History  Procedure Laterality Date  . Partial hysterectomy    . Breast surgery      removal of a melanoma  . Resection dysplastic nevus    . Nasal fracture surgery      History   Social History  . Marital Status: Married    Spouse Name: N/A    Number of Children: 2  . Years of Education: N/A   Occupational History  .      Engineer, manufacturing for hospitalist   Social History Main Topics  . Smoking status: Never Smoker   . Smokeless tobacco: Not on file  . Alcohol Use: No  . Drug Use: No  . Sexually Active: Yes    Birth Control/ Protection: None   Other Topics Concern  . Not on file   Social History Narrative   Employment: Print production planner for [employer]    ROS: no fevers or chills, productive cough, hemoptysis, dysphasia, odynophagia, melena, hematochezia, dysuria, hematuria, rash, seizure activity, orthopnea, PND, pedal edema, claudication. Remaining systems are negative.  Physical Exam: Well-developed well-nourished in no acute distress.  Skin is warm and dry.  HEENT is normal.  Neck is supple.  Chest is clear to auscultation with normal expansion.  Cardiovascular exam is regular rate and rhythm.  Abdominal exam nontender or  distended. No masses palpated. Extremities show no edema. neuro grossly intact

## 2013-04-22 NOTE — Patient Instructions (Addendum)
Your physician wants you to follow-up in: 6 MONTHS WITH DR Jens Som You will receive a reminder letter in the mail two months in advance. If you don't receive a letter, please call our office to schedule the follow-up appointment.   START METOPROLOL 25 MG ONCE DAILY AS NEEDED FOR PALPITATIONS

## 2013-04-22 NOTE — Assessment & Plan Note (Signed)
Patient continues to have palpitations. She did have some while her monitor was in place but these were not quite as severe. This revealed PACs and PVCs. Her LV function is normal. I have given her a prescription for Toprol 25 mg daily as needed. We can increase to daily if her symptoms persist.

## 2013-04-26 ENCOUNTER — Ambulatory Visit: Payer: 59

## 2013-04-28 ENCOUNTER — Ambulatory Visit
Admission: RE | Admit: 2013-04-28 | Discharge: 2013-04-28 | Disposition: A | Discharge status: Home / Self Care | Payer: 59 | Source: Ambulatory Visit | Attending: Family Medicine | Admitting: Family Medicine

## 2013-04-28 DIAGNOSIS — Z1239 Encounter for other screening for malignant neoplasm of breast: Secondary | ICD-10-CM

## 2013-04-29 ENCOUNTER — Encounter: Payer: Self-pay | Admitting: Family Medicine

## 2013-05-03 ENCOUNTER — Telehealth: Payer: Self-pay | Admitting: Family Medicine

## 2013-05-03 MED ORDER — ALPRAZOLAM 0.5 MG PO TABS: 0.2500 mg | ORAL_TABLET | Freq: Every day | ORAL | Status: DC | PRN

## 2013-05-03 NOTE — Telephone Encounter (Signed)
Called in.
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
## 2013-05-05 ENCOUNTER — Encounter: Payer: Self-pay | Admitting: Internal Medicine

## 2013-05-05 ENCOUNTER — Ambulatory Visit (INDEPENDENT_AMBULATORY_CARE_PROVIDER_SITE_OTHER): Payer: 59 | Admitting: Internal Medicine

## 2013-05-05 VITALS — BP 120/70 | HR 80 | Ht 65.0 in | Wt 208.0 lb

## 2013-05-05 DIAGNOSIS — K648 Other hemorrhoids: Secondary | ICD-10-CM

## 2013-05-05 DIAGNOSIS — K5900 Constipation, unspecified: Secondary | ICD-10-CM

## 2013-05-05 DIAGNOSIS — Z1211 Encounter for screening for malignant neoplasm of colon: Secondary | ICD-10-CM

## 2013-05-05 DIAGNOSIS — K5909 Other constipation: Secondary | ICD-10-CM

## 2013-05-05 DIAGNOSIS — Z800 Family history of malignant neoplasm of digestive organs: Secondary | ICD-10-CM

## 2013-05-05 MED ORDER — NA SULFATE-K SULFATE-MG SULF 17.5-3.13-1.6 GM/177ML PO SOLN: ORAL | Status: DC

## 2013-05-05 NOTE — Progress Notes (Signed)
Subjective:  Referred by: Ethelda Chick, MD   Patient ID: Deanna [name], female    DOB: 15-Feb-1966, 47 y.o.   MRN: [id number]  HPI The patient is a very nice middle-aged woman who sister had colon cancer diagnosed around age 60 and diaphragmatic 104. More recently her elderly mother was diagnosed with colon cancer in her early 75s. The patient had a screening colonoscopy about 6 years ago that was negative. Over time she has had chronic constipation, she has some intermittent bright red blood per rectum with known internal hemorrhoids. None of this is new or changed. GI review of systems is otherwise negative  No Known Allergies Outpatient Prescriptions Prior to Visit  Medication Sig Dispense Refill  . ALPRAZolam (XANAX) 0.5 MG tablet Take 0.5-1 tablets (0.25-0.5 mg total) by mouth daily as needed for sleep.  30 tablet  0  . cholecalciferol (VITAMIN D) 1000 UNITS tablet Take 4,000 Units by mouth daily.       . metoprolol succinate (TOPROL XL) 25 MG 24 hr tablet Take 1 tablet (25 mg total) by mouth daily.  90 tablet  4   No facility-administered medications prior to visit.   Past Medical History  Diagnosis Date  . Thyroid disease     Post-partum  . Dysplastic nevus   . Bleeding gums   . Hx of transfusion of packed red blood cells    Past Surgical History  Procedure Laterality Date  . Partial hysterectomy    . Breast surgery      removal of a melanoma  . Resection dysplastic nevus    . Nasal fracture surgery      post-op hemorrhage   History   Social History  . Marital Status: Married    Spouse Name: N/A    Number of Children: 2  . Years of Education: N/A   Occupational History  .  [employer]    Practice manager for hospitalist   Social History Main Topics  . Smoking status: Never Smoker   . Smokeless tobacco: Never Used  . Alcohol Use: No  . Drug Use: No  . Sexually Active: Yes    Birth Control/ Protection: None   Other Topics Concern  . None   Social  History Narrative   Employment: Print production planner for [employer]   Family History  Problem Relation Age of Onset  . Colon cancer Mother   . Diabetes Mother   . Hypertension Mother   . Stroke Father   . Hypertension Brother   . Thyroid disease Sister   . Colon cancer Sister     Review of Systems Stress over nephews death, chronic gingival bleeding of unclear etiology with normal coags and platelets. She did have bleeding after nasal surgery for a fracture years ago but tolerated a hysterectomy without problems. She has a hematology appointment for mid July.    Objective:   Physical Exam General:  Well-developed, well-nourished and in no acute distress - obese Eyes:  anicteric. Lungs: Clear to auscultation bilaterally. Heart:  S1S2, no rubs, murmurs, gallops. Abdomen:  soft, non-tender, no hepatosplenomegaly, hernia, or mass and BS+.  Rectal: deferred Extremities:   no edema Neuro:  A&O x 3.  Psych:  appropriate mood and  Affect.   Data Reviewed: I have requested the records of her previous colonoscopy. I have reviewed the labs in the EMR and Dr. Michaelle Copas recent notes.    Assessment & Plan:   1. Chronic constipation   2. Hemorrhoids, internal, with  bleeding   3. Special screening for malignant neoplasms, colon   4. Family history of colon cancer requiring screening colonoscopy    1. She will schedule a screening colonoscopy. 2. High fiber diet and possible use of Benefiber or generic equivalent is discussed with the patient in information given regarding her chronic constipation and chronic intermittent hemorrhoid symptoms. The risks and benefits as well as alternatives of endoscopic procedure(s) have been discussed and reviewed. All questions answered. The patient agrees to proceed.  The cause of her chronic recurrent gingival bleeding is not clearly associated with any type of bleeding diathesis that I can tell. I'm not necessarily surprised that she had post surgical  nasal bleeding, she tolerated other procedures without bleeding so I'm not very suspicious of bleeding disorder but she does have a hematology appointment coming up prior to the colonoscopy.   I appreciate the opportunity to care for this patient. CC: SMITH,KRISTI, MD

## 2013-05-05 NOTE — Patient Instructions (Addendum)

## 2013-05-14 ENCOUNTER — Encounter: Payer: Self-pay | Admitting: Family Medicine

## 2013-05-18 ENCOUNTER — Telehealth: Payer: Self-pay | Admitting: Hematology and Oncology

## 2013-05-18 NOTE — Telephone Encounter (Signed)
C/D 05/18/13 for appt. 05/19/13 °

## 2013-05-19 ENCOUNTER — Other Ambulatory Visit: Payer: Self-pay | Admitting: Medical Oncology

## 2013-05-19 ENCOUNTER — Ambulatory Visit (HOSPITAL_BASED_OUTPATIENT_CLINIC_OR_DEPARTMENT_OTHER): Payer: 59 | Admitting: Hematology and Oncology

## 2013-05-19 ENCOUNTER — Telehealth: Payer: Self-pay | Admitting: Hematology and Oncology

## 2013-05-19 ENCOUNTER — Encounter: Payer: Self-pay | Admitting: Hematology and Oncology

## 2013-05-19 ENCOUNTER — Ambulatory Visit: Payer: 59

## 2013-05-19 ENCOUNTER — Other Ambulatory Visit: Payer: 59 | Admitting: Lab

## 2013-05-19 ENCOUNTER — Ambulatory Visit: Payer: 59 | Admitting: Lab

## 2013-05-19 VITALS — BP 126/82 | HR 87 | Temp 97.7°F | Resp 18 | Ht 65.0 in | Wt 208.5 lb

## 2013-05-19 DIAGNOSIS — R58 Hemorrhage, not elsewhere classified: Secondary | ICD-10-CM

## 2013-05-19 DIAGNOSIS — D649 Anemia, unspecified: Secondary | ICD-10-CM

## 2013-05-19 LAB — CBC WITH DIFFERENTIAL/PLATELET
Basophils Absolute: 0 10*3/uL (ref 0.0–0.1)
Eosinophils Absolute: 0.1 10*3/uL (ref 0.0–0.5)
HCT: 37.1 % (ref 34.8–46.6)
HGB: 12.6 g/dL (ref 11.6–15.9)
LYMPH%: 17.1 % (ref 14.0–49.7)
MCV: 89.7 fL (ref 79.5–101.0)
MONO%: 6.8 % (ref 0.0–14.0)
NEUT#: 5.9 10*3/uL (ref 1.5–6.5)
NEUT%: 73.7 % (ref 38.4–76.8)
Platelets: 314 10*3/uL (ref 145–400)
RDW: 12.7 % (ref 11.2–14.5)

## 2013-05-19 LAB — COMPREHENSIVE METABOLIC PANEL (CC13)
Albumin: 3.7 g/dL (ref 3.5–5.0)
Alkaline Phosphatase: 82 U/L (ref 40–150)
BUN: 12.3 mg/dL (ref 7.0–26.0)
Creatinine: 0.8 mg/dL (ref 0.6–1.1)
Glucose: 73 mg/dl (ref 70–140)
Potassium: 3.7 mEq/L (ref 3.5–5.1)

## 2013-05-19 LAB — IVY BLEEDING TIME

## 2013-05-19 LAB — PLATELET FUNCTION ASSAY: Collagen / Epinephrine: 219 seconds (ref 0–184)

## 2013-05-19 NOTE — Progress Notes (Signed)
Checked in new pt with no financial concerns. °

## 2013-05-19 NOTE — Telephone Encounter (Signed)
sent pt to labs today °

## 2013-05-19 NOTE — Progress Notes (Signed)
Subjective:  Referred by: Ethelda Chick, MD   Patient ID: Deanna [name], female    DOB: [birth year], 47 y.o.   MRN: [id number]  HPI The patient is a very nice 47 y old female with no significant medical problem who has been having gingival bleeding on and off for about 2 years now. She also stated that she bruise easily. No family history of bleeding disorder and patient her self underwent hysterectomy with 2 vaginal delivery with no bleeding problem.  She complaints of occasional hematuria (1-2/year).  She has had chronic constipation, she has some intermittent bright red blood per rectum with known internal hemorrhoids. Denied ASA or NSAID use.   No Known Allergies Outpatient Prescriptions Prior to Visit  Medication Sig Dispense Refill  . cholecalciferol (VITAMIN D) 1000 UNITS tablet Take 4,000 Units by mouth daily.       . Na Sulfate-K Sulfate-Mg Sulf (SUPREP BOWEL PREP) SOLN Use as directed  2 Bottle  0  . ALPRAZolam (XANAX) 0.5 MG tablet Take 0.5-1 tablets (0.25-0.5 mg total) by mouth daily as needed for sleep.  30 tablet  0  . metoprolol succinate (TOPROL XL) 25 MG 24 hr tablet Take 1 tablet (25 mg total) by mouth daily.  90 tablet  4   No facility-administered medications prior to visit.   Past Medical History  Diagnosis Date  . Thyroid disease     Post-partum  . Dysplastic nevus   . Bleeding gums   . Hx of transfusion of packed red blood cells    Past Surgical History  Procedure Laterality Date  . Partial hysterectomy    . Breast surgery      removal of a melanoma  . Resection dysplastic nevus    . Nasal fracture surgery      post-op hemorrhage   History   Social History  . Marital Status: Married    Spouse Name: N/A    Number of Children: 2  . Years of Education: N/A   Occupational History  .  [employer]    Practice manager for hospitalist   Social History Main Topics  . Smoking status: Never Smoker   . Smokeless tobacco: Never Used  . Alcohol Use: No   . Drug Use: No  . Sexually Active: Yes    Birth Control/ Protection: None   Other Topics Concern  . Not on file   Social History Narrative   Employment: Print production planner for [employer]   Family History  Problem Relation Age of Onset  . Colon cancer Mother   . Diabetes Mother   . Hypertension Mother   . Stroke Father   . Hypertension Brother   . Thyroid disease Sister   . Colon cancer Sister     Review of Systems Stress over nephews death, chronic gingival bleeding of unclear etiology with normal coags and platelets. She did have bleeding after nasal surgery for a fracture years ago but tolerated a hysterectomy without problems. She has a hematology appointment for mid July.    Blood pressure 126/82, pulse 87, temperature 97.7 F (36.5 C), temperature source Oral, resp. rate 18, height 5\' 5"  (1.651 m), weight 208 lb 8 oz (94.575 kg).  Objective:   Physical Exam General:  Well-developed, well-nourished and in no acute distress - obese Eyes:  anicteric. Lungs: Clear to auscultation bilaterally. Heart:  S1S2, no rubs, murmurs, gallops. Abdomen:  soft, non-tender, no hepatosplenomegaly, hernia, or mass and BS+.  Extremities:   no edema Neuro:  A&O x 3.  Psych:  appropriate mood and  Affect.   Assessment & Plan:  Deanna [name] is a 47 y old female with what appears to be chronic recurrent gingival bleeding , it sounds more like a platelets problem rather than a problem in the coagulation pathway. Will order basic work up such as CBC, INR, PTT, Plts function assay as well as von willebrand assay. Will call patient with result to discuss he finding and the next step.   Of note, patient was advised to see a urologist for further evaluation of the hematuria.    I appreciate the opportunity to care for this patient.   Zachery Dakins, MD 05/19/2013 2:42 PM   CC: Nilda Simmer, MD

## 2013-05-20 ENCOUNTER — Telehealth: Payer: Self-pay | Admitting: Hematology and Oncology

## 2013-05-20 LAB — PROTHROMBIN TIME: INR: 1.02 (ref <1.50)

## 2013-05-20 LAB — APTT: aPTT: 35 seconds (ref 24–37)

## 2013-05-20 NOTE — Telephone Encounter (Signed)
lmonvm for pt re appt for 7/31. Mailed schedule. Pt was already sent back to lb today.

## 2013-05-21 LAB — VON WILLEBRAND PANEL: Coagulation Factor VIII: 62 % — ABNORMAL LOW (ref 73–140)

## 2013-05-25 ENCOUNTER — Other Ambulatory Visit: Payer: Self-pay | Admitting: Hematology and Oncology

## 2013-05-25 DIAGNOSIS — R58 Hemorrhage, not elsewhere classified: Secondary | ICD-10-CM

## 2013-05-31 ENCOUNTER — Telehealth: Payer: Self-pay | Admitting: Hematology and Oncology

## 2013-05-31 NOTE — Telephone Encounter (Signed)
pt called to r/s appt....Done °

## 2013-06-03 ENCOUNTER — Ambulatory Visit: Payer: 59

## 2013-06-04 ENCOUNTER — Ambulatory Visit (HOSPITAL_BASED_OUTPATIENT_CLINIC_OR_DEPARTMENT_OTHER): Payer: 59 | Admitting: Lab

## 2013-06-04 ENCOUNTER — Telehealth: Payer: Self-pay | Admitting: Hematology and Oncology

## 2013-06-04 ENCOUNTER — Ambulatory Visit (HOSPITAL_BASED_OUTPATIENT_CLINIC_OR_DEPARTMENT_OTHER): Payer: 59 | Admitting: Hematology and Oncology

## 2013-06-04 VITALS — BP 131/76 | HR 67 | Temp 98.1°F | Resp 20 | Ht 65.0 in | Wt 207.0 lb

## 2013-06-04 DIAGNOSIS — D649 Anemia, unspecified: Secondary | ICD-10-CM

## 2013-06-04 DIAGNOSIS — K055 Other periodontal diseases: Secondary | ICD-10-CM

## 2013-06-04 DIAGNOSIS — K068 Other specified disorders of gingiva and edentulous alveolar ridge: Secondary | ICD-10-CM

## 2013-06-04 DIAGNOSIS — R58 Hemorrhage, not elsewhere classified: Secondary | ICD-10-CM

## 2013-06-04 NOTE — Progress Notes (Signed)
Subjective:  Referred by: Ethelda Chick, MD   Patient ID: Deanna [name], female    DOB: 07-06-1966, 47 y.o.   MRN: [id number]  HPI The patient is a very nice 47 y old female with no significant medical problem who has been having gingival bleeding on and off for about 2 years now. She also stated that she bruise easily. No family history of bleeding disorder and patient her self underwent hysterectomy with 2 vaginal delivery with no bleeding problem.  She complaints of occasional hematuria (1-2/year).  She has had chronic constipation, she has some intermittent bright red blood per rectum with known internal hemorrhoids. Denied ASA or NSAID use. Normal INR, PT. W/u revealed mildly low Von Willebrand activity as well as prolongation of bleeding time. Deanna [name] is here today to discuss the result. She feels well and has no new complaints, no change in her symptoms.   No Known Allergies Outpatient Prescriptions Prior to Visit  Medication Sig Dispense Refill  . cholecalciferol (VITAMIN D) 1000 UNITS tablet Take 4,000 Units by mouth daily.       . Multiple Vitamin (MULTIVITAMIN) capsule Take 1 capsule by mouth daily.      . Na Sulfate-K Sulfate-Mg Sulf (SUPREP BOWEL PREP) SOLN Use as directed  2 Bottle  0  . ALPRAZolam (XANAX) 0.5 MG tablet Take 0.5-1 tablets (0.25-0.5 mg total) by mouth daily as needed for sleep.  30 tablet  0  . metoprolol succinate (TOPROL XL) 25 MG 24 hr tablet Take 1 tablet (25 mg total) by mouth daily.  90 tablet  4   No facility-administered medications prior to visit.   Past Medical History  Diagnosis Date  . Thyroid disease     Post-partum  . Dysplastic nevus   . Bleeding gums   . Hx of transfusion of packed red blood cells    Past Surgical History  Procedure Laterality Date  . Partial hysterectomy    . Breast surgery      removal of a melanoma  . Resection dysplastic nevus    . Nasal fracture surgery      post-op hemorrhage   History    Social History  . Marital Status: Married    Spouse Name: N/A    Number of Children: 2  . Years of Education: N/A   Occupational History  .  [employer]    Practice manager for hospitalist   Social History Main Topics  . Smoking status: Never Smoker   . Smokeless tobacco: Never Used  . Alcohol Use: No  . Drug Use: No  . Sexually Active: Yes    Birth Control/ Protection: None   Other Topics Concern  . Not on file   Social History Narrative   Employment: Print production planner for [employer]   Family History  Problem Relation Age of Onset  . Colon cancer Mother   . Diabetes Mother   . Hypertension Mother   . Stroke Father   . Hypertension Brother   . Thyroid disease Sister   . Colon cancer Sister     Review of Systems Stress over nephews death, chronic gingival bleeding of unclear etiology with normal coags and platelets. She did have bleeding after nasal surgery for a fracture years ago but tolerated a hysterectomy without problems. She has a hematology appointment for mid July.    Blood pressure 131/76, pulse 67, temperature 98.1 F (36.7 C), temperature source Oral, resp. rate 20, height 5\' 5"  (1.651 m), weight  207 lb (93.895 kg).  Objective:   Physical Exam General:  Well-developed, well-nourished and in no acute distress - obese Eyes:  anicteric. Lungs: Clear to auscultation bilaterally. Heart:  S1S2, no rubs, murmurs, gallops. Abdomen:  soft, non-tender, no hepatosplenomegaly, hernia, or mass and BS+.  Extremities:   no edema Neuro:  A&O x 3.  Psych:  appropriate mood and  Affect.  Results for BLANCH, STANG (MRN [id number]) as of 06/04/2013 15:16  Ref. Range 02/11/2013 20:27 04/12/2013 09:32 04/12/2013 09:33 05/19/2013 14:49 05/19/2013 15:09  Coagulation Factor VIII Latest Range: 73-140 %    62 (L)   Von Willebrand Antigen, Plasma Latest Range: 50-217 %    66   Bleeding Time Latest Range: 2.0-8.0 Minutes    14.0 (H)   Prothrombin Time Latest Range: 11.6-15.2  seconds 13.6 13.4   13.2  INR Latest Range: <1.50  1.04 1.02   1.02  APTT Latest Range: 24-37 seconds 38 (H)  33  35   Assessment & Plan:  Deanna [name] is a 47 y old y old female with what appears to be chronic recurrent gingival bleeding , it is more of a platelets problem rather than a problem in the coagulation pathway.  Plts function assay as well as von willebrand assay as [name] above is slightly abnormal indicated All-Bran disease. Today patient we explained to the result to the patient, went over the findings and informed her that next step would be Von Willebrand multimeric to determine which kind of  Von Willebrand diease does she has; my guess this will type 1.  Will call patient with result when it is available.   Zachery Dakins, MD 06/04/2013 9:57 AM   CC: Nilda Simmer, MD

## 2013-06-04 NOTE — Telephone Encounter (Signed)
gave pt AVs, will call pt for appt for August 2014, Md only nurse aware

## 2013-06-07 ENCOUNTER — Telehealth: Payer: Self-pay | Admitting: Hematology and Oncology

## 2013-06-07 NOTE — Telephone Encounter (Signed)
s.w. pt and advised on 7.11.14 appt...pt ok and aware

## 2013-06-11 ENCOUNTER — Telehealth: Payer: Self-pay

## 2013-06-11 LAB — PLATELET AGGREGATION STUDY, BLOOD
ADP5: NORMAL
Epinephrine 10: NORMAL

## 2013-06-11 NOTE — Telephone Encounter (Signed)
Left message for Deanna [name] that all the lab work for [facility] [facility] is not back yet so 06-14-13 appointment will be cancelled and rescheduled when the results are back.

## 2013-06-14 ENCOUNTER — Ambulatory Visit: Payer: 59

## 2013-06-14 LAB — VON WILLEBRAND FACTOR MULTIMER
Factor-VIII Activity: 60 % (ref 50–180)
Von Willebrand Factor Ag: 75 % (ref 50–217)

## 2013-06-18 ENCOUNTER — Other Ambulatory Visit: Payer: Self-pay

## 2013-06-21 ENCOUNTER — Telehealth: Payer: Self-pay | Admitting: Hematology and Oncology

## 2013-06-21 NOTE — Telephone Encounter (Signed)
, °

## 2013-06-29 ENCOUNTER — Telehealth: Payer: Self-pay | Admitting: Internal Medicine

## 2013-06-29 NOTE — Telephone Encounter (Signed)
Left message for patient to call back  

## 2013-06-29 NOTE — Telephone Encounter (Signed)
Patient is seeing hematology for problem with abnormal bleeding and an abnormal platelet count.  They have not determined the cause and have recommended the patient postpone the colonoscopy until they have completed their testing.  She will call back once hematology says it is ok to proceed.

## 2013-07-01 ENCOUNTER — Ambulatory Visit (HOSPITAL_BASED_OUTPATIENT_CLINIC_OR_DEPARTMENT_OTHER): Payer: 59 | Admitting: Hematology and Oncology

## 2013-07-01 ENCOUNTER — Ambulatory Visit: Payer: 59 | Admitting: Lab

## 2013-07-01 ENCOUNTER — Telehealth: Payer: Self-pay | Admitting: Hematology and Oncology

## 2013-07-01 VITALS — BP 132/72 | HR 69 | Temp 98.0°F | Resp 18 | Ht 65.0 in | Wt 209.0 lb

## 2013-07-01 DIAGNOSIS — K055 Other periodontal diseases: Secondary | ICD-10-CM

## 2013-07-01 DIAGNOSIS — R58 Hemorrhage, not elsewhere classified: Secondary | ICD-10-CM

## 2013-07-01 NOTE — Telephone Encounter (Signed)
gv and printed appt sched and avs for pt for NOV °

## 2013-07-02 ENCOUNTER — Encounter: Payer: 59 | Admitting: Internal Medicine

## 2013-07-13 NOTE — Progress Notes (Signed)
IDCANDIDA VETTER OB: December 11, 1965  MR#: [id number]  WUJ#:[id number]  [facility]  [phone] [phone]   OFFICE PROGRESS NOTE  PCP: Nilda Simmer, MD  PROBLEM: Chronic recurrent gingival bleeding.    HISTORY OF PRESENT ILLNESS: The patient is a very nice 7 y old female with no significant medical problem who has been having gingival bleeding on and off for about 2 years now. She also stated that she bruise easily. No family history of bleeding disorder and patient her self underwent hysterectomy with 2 vaginal delivery with no bleeding problem. She complaints of occasional hematuria (1-2/year). She has had chronic constipation, she has some intermittent bright red blood per rectum with known internal hemorrhoids. Denied ASA or NSAID use. Normal INR, PT. W/u revealed mildly low Von Willebrand activity as well as prolongation of bleeding time. Work up from 06/04/2013 showed No laboratory evidence of von Willebrand disease.   INTERVAL HISTORY: Patient presented today for follow up. She reported headache behind her right eye which is more frequent, gum bleeding occasionally blood in stool and urine. She had colonoscopy 6 years ago, hearing lost on left, numbness in toes and bottom of her feet, nasal congestion, sore throat, constipation. Occasionally she has palpitations for which she had Holter monitor for 3 weeks. The patient denied fever, chills, night sweats, change in appetite or weight. She denied  double vision, blurry vision, nasal discharge, hearing problems, odynophagia or dysphagia. No chest pain, palpitations, dyspnea, cough, abdominal pain, nausea, vomiting, diarrhea. The patient denied dysuria, nocturia, polyuria, myalgia,  tingling, psychiatric problems. Review of Systems  Constitutional: Negative for fever, chills, weight loss, malaise/fatigue and diaphoresis.  HENT: Positive for hearing loss, congestion and sore throat. Negative for nosebleeds, neck  pain and tinnitus.   Eyes: Negative for blurred vision, double vision, photophobia, pain and discharge.  Respiratory: Negative for cough, hemoptysis, sputum production, shortness of breath and wheezing.   Cardiovascular: Positive for palpitations. Negative for chest pain, orthopnea, claudication, leg swelling and PND.  Gastrointestinal: Positive for constipation and blood in stool. Negative for heartburn, nausea, vomiting, abdominal pain, diarrhea and melena.  Genitourinary: Positive for hematuria. Negative for dysuria, urgency, frequency and flank pain.  Musculoskeletal: Negative for myalgias, back pain, joint pain and falls.  Skin: Negative for itching and rash.  Neurological: Positive for tremors and headaches. Negative for dizziness, tingling, speech change, focal weakness, seizures, loss of consciousness and weakness.  Endo/Heme/Allergies: Bruises/bleeds easily.  Psychiatric/Behavioral: Negative.      PAST MEDICAL HISTORY: Past Medical History  Diagnosis Date  . Thyroid disease     Post-partum  . Dysplastic nevus   . Bleeding gums   . Hx of transfusion of packed red blood cells     PAST SURGICAL HISTORY: Past Surgical History  Procedure Laterality Date  . Partial hysterectomy    . Breast surgery      removal of a melanoma  . Resection dysplastic nevus    . Nasal fracture surgery      post-op hemorrhage    FAMILY HISTORY Family History  Problem Relation Age of Onset  . Colon cancer Mother   . Diabetes Mother   . Hypertension Mother   . Stroke Father   . Hypertension Brother   . Thyroid disease Sister   . Colon cancer Sister    HEALTH MAINTENANCE: History  Substance Use Topics  . Smoking status: Never Smoker   . Smokeless tobacco: Never Used  . Alcohol Use: No    No Known  Allergies  Current Outpatient Prescriptions  Medication Sig Dispense Refill  . ALPRAZolam (XANAX) 0.5 MG tablet Take 0.5-1 tablets (0.25-0.5 mg total) by mouth daily as needed for sleep.   30 tablet  0  . cholecalciferol (VITAMIN D) 1000 UNITS tablet Take 4,000 Units by mouth daily.       . Multiple Vitamin (MULTIVITAMIN) capsule Take 1 capsule by mouth daily.      . Na Sulfate-K Sulfate-Mg Sulf (SUPREP BOWEL PREP) SOLN Use as directed  2 Bottle  0   No current facility-administered medications for this visit.    OBJECTIVE: Filed Vitals:   07/01/13 0947  BP: 132/72  Pulse: 69  Temp: 98 F (36.7 C)  Resp: 18     Body mass index is 34.78 kg/(m^2).    ECOG FS:0  PHYSICAL EXAMINATION:  HEENT: Sclerae anicteric.  Conjunctivae were pink. Pupils round and reactive bilaterally. Oral mucosa is moist without ulceration or thrush. No occipital, submandibular, cervical, supraclavicular or axillar adenopathy. Lungs: clear to auscultation without wheezes. No rales or rhonchi. Heart: regular rate and rhythm. No murmur, gallop or rubs. Abdomen: soft, non tender. No guarding or rebound tenderness. Bowel sounds are present. No palpable hepatosplenomegaly. MSK: no focal spinal tenderness. Extremities: No clubbing or cyanosis.No calf tenderness to palpitation, no peripheral edema. The patient had grossly intact strength in upper and lower extremities. Skin exam was without ecchymosis, petechiae. Neuro: non-focal, alert and oriented to time, person and place, appropriate affect  LAB RESULTS:  CMP     Component Value Date/Time   NA 141 05/19/2013 1449   NA 140 04/12/2013 0932   K 3.7 05/19/2013 1449   K 4.0 04/12/2013 0932   CL 106 04/12/2013 0932   CO2 27 05/19/2013 1449   CO2 26 04/12/2013 0932   GLUCOSE 73 05/19/2013 1449   GLUCOSE 76 04/12/2013 0932   BUN 12.3 05/19/2013 1449   BUN 11 04/12/2013 0932   CREATININE 0.8 05/19/2013 1449   CREATININE 0.67 04/12/2013 0932   CALCIUM 9.2 05/19/2013 1449   CALCIUM 9.1 04/12/2013 0932   PROT 7.8 05/19/2013 1449   PROT 6.9 04/12/2013 0932   ALBUMIN 3.7 05/19/2013 1449   ALBUMIN 3.8 04/12/2013 0932   AST 12 05/19/2013 1449   AST 14 04/12/2013 0932   ALT 10  05/19/2013 1449   ALT 10 04/12/2013 0932   ALKPHOS 82 05/19/2013 1449   ALKPHOS 72 04/12/2013 0932   BILITOT 0.30 05/19/2013 1449   BILITOT 0.4 04/12/2013 0932    I No results found for this basename: SPEP, UPEP,  kappa and lambda light chains    Lab Results  Component Value Date   WBC 7.9 05/19/2013   NEUTROABS 5.9 05/19/2013   HGB 12.6 05/19/2013   HCT 37.1 05/19/2013   MCV 89.7 05/19/2013   PLT 314 05/19/2013      Chemistry      Component Value Date/Time   NA 141 05/19/2013 1449   NA 140 04/12/2013 0932   K 3.7 05/19/2013 1449   K 4.0 04/12/2013 0932   CL 106 04/12/2013 0932   CO2 27 05/19/2013 1449   CO2 26 04/12/2013 0932   BUN 12.3 05/19/2013 1449   BUN 11 04/12/2013 0932   CREATININE 0.8 05/19/2013 1449   CREATININE 0.67 04/12/2013 0932      Component Value Date/Time   CALCIUM 9.2 05/19/2013 1449   CALCIUM 9.1 04/12/2013 0932   ALKPHOS 82 05/19/2013 1449   ALKPHOS 72 04/12/2013 0932   AST 12  05/19/2013 1449   AST 14 04/12/2013 0932   ALT 10 05/19/2013 1449   ALT 10 04/12/2013 0932   BILITOT 0.30 05/19/2013 1449   BILITOT 0.4 04/12/2013 0932      STUDIES: No results found.  ASSESSMENT AND PLAN: 1. Chronic recurrent gingival bleeding.  Platelets functional assay: prolongation of time for collagen/epinephrine and collagen/ADP. Negative von Wiiebrand panel. 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 Currently we do not have treatment for platelets function deficiency.If patient develops significant bleeding platelets should be transfuse.  We are not aware that any diet can fixed this problem. 2. Follow up in 3 months  Myra Rude, MD   07/13/2013 5:21 AM

## 2013-07-16 ENCOUNTER — Telehealth: Payer: Self-pay | Admitting: *Deleted

## 2013-07-16 NOTE — Telephone Encounter (Signed)
Pt left VM requesting results of lab work.  Dr. Alecia Lemming reviewed labs, state he did already review results w/ pt on last office visit.  States all labs are negative, wnl and no explanation for bleeding. Pt should f/u as scheduled for lab and office visit in November.  Called pt back and left her VM informing her of above and to please call back if any other questions/concerns.

## 2013-07-19 ENCOUNTER — Ambulatory Visit (INDEPENDENT_AMBULATORY_CARE_PROVIDER_SITE_OTHER): Payer: 59 | Admitting: Family Medicine

## 2013-07-19 ENCOUNTER — Encounter: Payer: Self-pay | Admitting: Family Medicine

## 2013-07-19 VITALS — BP 114/78 | HR 62 | Temp 98.1°F | Resp 16 | Ht 64.5 in | Wt 205.0 lb

## 2013-07-19 DIAGNOSIS — M5481 Occipital neuralgia: Secondary | ICD-10-CM | POA: Insufficient documentation

## 2013-07-19 DIAGNOSIS — R202 Paresthesia of skin: Secondary | ICD-10-CM

## 2013-07-19 DIAGNOSIS — M531 Cervicobrachial syndrome: Secondary | ICD-10-CM

## 2013-07-19 DIAGNOSIS — R58 Hemorrhage, not elsewhere classified: Secondary | ICD-10-CM

## 2013-07-19 DIAGNOSIS — R5381 Other malaise: Secondary | ICD-10-CM

## 2013-07-19 DIAGNOSIS — R319 Hematuria, unspecified: Secondary | ICD-10-CM

## 2013-07-19 DIAGNOSIS — R002 Palpitations: Secondary | ICD-10-CM

## 2013-07-19 DIAGNOSIS — N390 Urinary tract infection, site not specified: Secondary | ICD-10-CM

## 2013-07-19 DIAGNOSIS — Z800 Family history of malignant neoplasm of digestive organs: Secondary | ICD-10-CM

## 2013-07-19 DIAGNOSIS — K055 Other periodontal diseases: Secondary | ICD-10-CM

## 2013-07-19 DIAGNOSIS — K068 Other specified disorders of gingiva and edentulous alveolar ridge: Secondary | ICD-10-CM

## 2013-07-19 LAB — POCT URINALYSIS DIPSTICK
Ketones, UA: NEGATIVE
Protein, UA: NEGATIVE
Spec Grav, UA: 1.02
pH, UA: 6

## 2013-07-19 LAB — POCT UA - MICROSCOPIC ONLY
Casts, Ur, LPF, POC: NEGATIVE
Yeast, UA: NEGATIVE

## 2013-07-19 MED ORDER — CYCLOBENZAPRINE HCL 5 MG PO TABS: 5.0000 mg | ORAL_TABLET | Freq: Every day | ORAL | Status: DC

## 2013-07-19 NOTE — Assessment & Plan Note (Signed)
New.  B distal feet; consistent with mild peripheral neuropathy. Monitor over the next three months; if worsens or persists, refer to neurology.

## 2013-07-19 NOTE — Assessment & Plan Note (Signed)
Stable; due for colonoscopy but recommend waiting to schedule until next follow-up with hematology.

## 2013-07-19 NOTE — Assessment & Plan Note (Signed)
Persistent yet multiple personal stressors.  If persists, consider sleep study.  Encourage good sleep hygiene.

## 2013-07-19 NOTE — Assessment & Plan Note (Signed)
Persistent.  S/p Hematology consultation; continue with dental follow-up.

## 2013-07-19 NOTE — Assessment & Plan Note (Signed)
New. Recurrent issue; obtain u/a; refer to urology.

## 2013-07-19 NOTE — Progress Notes (Signed)
[address]   [place], [place]  [address]   [phone]  Subjective:    Patient ID: Deanna [name], female    DOB: 06-Oct-1966, 47 y.o.   MRN: [id number]  HPI This 47 y.o. female presents for three month follow-up of the following:  1. Bleeding:  S/p hematology consultation.   "Feel the same." According to hematologist, Von Willebrand negative, some type of platelet disorder. Did not have a good experience with hematologist. Has seen two who were locum tentums. Doesn't feel very confident with results she's been provided; feels frustrated. Doesn't know who she will see next time. Willing to try local oncology one more time then will consider referral if not satisfied. Patient postponed colonoscopy until bleeding figured out. Occasional hematuria, infrequent. No vaginal symptoms.s/p hysterectomy so no concern of vaginal source.   Continued gingival bleeding. No suggestions from hematologist regarding this. Dentist concerned about future tooth loss  2. Tingling:  Toes and soles to ball of feet numb a lot B feet, "tingly," feels like menthol on feet. Right side worse than left side. Occuring for a couple of months. Occasional back stiffness, tightness, soreness, no trueback pain. No radiating pain.   3.  Headaches:  Having sharp pains at back of head, lasts 10-15 seconds, feels like a spasm/contraction. Gets worse, peaks, then wears off. Repetitive, occurs daily a couple of times a day up to frequent, throughout the day. When pain severe, also hurts behind right eye. No neck pain. No visual changes. Sometimes progresses to severe headache with nausea. Only a couple of times this severe in last couple of months. Does not typically have migraines- a couple over many years.  No diplopia.  No dizziness.  Has not taken anything for headaches or head spasm.  Pain of spasm can radiate to R eye.    4. Palpitations:  Given metoprolol to take as needed by Dr. Jens Som. Did not take Metoprolol.  Palpitations minimally bothersome.  5. Fatigue: unchanged at this time.  Multiple stressors since last visit.  Still fatigued. 17 year old nephew killed in accident in June. FIL fell and broke hip.  Mother dx with colon ca.  Holding up "ok." Took 6-7 Xanax to get through nephew's funeral. "I feel like I am managing ok." Lost sister to colon ca 5 years ago, familiar with depression. Had to take medicine then for awhile.   Review of Systems  Constitutional: Positive for fatigue. Negative for fever, chills and diaphoresis.  HENT: Positive for dental problem.   Eyes: Positive for photophobia and pain. Negative for discharge, redness, itching and visual disturbance.  Respiratory: Negative for shortness of breath.   Cardiovascular: Positive for palpitations.  Genitourinary: Positive for hematuria. Negative for dysuria, urgency, frequency, flank pain, vaginal bleeding, vaginal discharge, vaginal pain and pelvic pain.  Musculoskeletal: Positive for back pain.  Skin: Negative for rash.  Neurological: Positive for numbness and headaches. Negative for dizziness, tremors, seizures, syncope, facial asymmetry, speech difficulty, weakness and light-headedness.  Psychiatric/Behavioral: Positive for sleep disturbance and dysphoric mood. Negative for suicidal ideas and self-injury. The patient is nervous/anxious.    Work "good. We're busy."   Past Medical History  Diagnosis Date  . Thyroid disease     Post-partum  . Dysplastic nevus   . Bleeding gums   . Hx of transfusion of packed red blood cells     Past Surgical History  Procedure Laterality Date  . Partial hysterectomy    . Breast surgery  removal of a melanoma  . Resection dysplastic nevus    . Nasal fracture surgery      post-op hemorrhage    Prior to Admission medications   Medication Sig Start Date End Date Taking? Authorizing Provider  cholecalciferol (VITAMIN D) 1000 UNITS tablet Take 4,000 Units by mouth daily.    Yes  Historical Provider, MD  Multiple Vitamin (MULTIVITAMIN) capsule Take 1 capsule by mouth daily.   Yes Historical Provider, MD  ALPRAZolam Prudy Feeler) 0.5 MG tablet Take 0.5-1 tablets (0.25-0.5 mg total) by mouth daily as needed for sleep. 05/03/13   Ethelda Chick, MD  cyclobenzaprine (FLEXERIL) 5 MG tablet Take 1 tablet (5 mg total) by mouth at bedtime. 07/19/13   Ethelda Chick, MD  Na Sulfate-K Sulfate-Mg Sulf (SUPREP BOWEL PREP) SOLN Use as directed 05/05/13   Iva Boop, MD    No Known Allergies  History   Social History  . Marital Status: Married    Spouse Name: N/A    Number of Children: 2  . Years of Education: N/A   Occupational History  .  [employer]    Practice manager for hospitalist   Social History Main Topics  . Smoking status: Never Smoker   . Smokeless tobacco: Never Used  . Alcohol Use: No  . Drug Use: No  . Sexual Activity: Yes    Birth Control/ Protection: None   Other Topics Concern  . Not on file   Social History Narrative   Employment: Print production planner for [employer]    Family History  Problem Relation Age of Onset  . Colon cancer Mother   . Diabetes Mother   . Hypertension Mother   . Stroke Father   . Hypertension Brother   . Thyroid disease Sister   . Colon cancer Sister        Objective:   Physical Exam  Nursing note and vitals reviewed. Constitutional: She is oriented to person, place, and time. She appears well-developed and well-nourished. No distress.  HENT:  Head: Normocephalic and atraumatic.  Right Ear: External ear normal.  Left Ear: External ear normal.  Nose: Nose normal.  Mouth/Throat: Oropharynx is clear and moist.  Eyes: Conjunctivae and EOM are normal. Pupils are equal, round, and reactive to light.  Neck: Normal range of motion. Neck supple. No thyromegaly present.  Cardiovascular: Normal rate, regular rhythm and normal heart sounds.  Exam reveals no gallop and no friction rub.   No murmur heard. Pulmonary/Chest:  Effort normal and breath sounds normal. She has no wheezes. She has no rales.  Musculoskeletal:       Right shoulder: Normal.       Left shoulder: Normal.       Cervical back: Normal.       Thoracic back: Normal.       Lumbar back: Normal.  Lymphadenopathy:    She has no cervical adenopathy.  Neurological: She is alert and oriented to person, place, and time. She has normal strength. No cranial nerve deficit or sensory deficit. She exhibits normal muscle tone. She displays a negative Romberg sign. Coordination normal.  Decreased sensation with monofilament distal feet B.  Skin: Skin is warm and dry. No rash noted. She is not diaphoretic.  Psychiatric: She has a normal mood and affect. Her behavior is normal. Judgment and thought content normal.   Results for orders placed in visit on 07/19/13  POCT URINALYSIS DIPSTICK      Result Value Range   Color, UA  yellow     Clarity, UA clear     Glucose, UA neg     Bilirubin, UA neg     Ketones, UA neg     Spec Grav, UA 1.020     Blood, UA small     pH, UA 6.0     Protein, UA neg     Urobilinogen, UA 0.2     Nitrite, UA neg     Leukocytes, UA Trace    POCT UA - MICROSCOPIC ONLY      Result Value Range   WBC, Ur, HPF, POC 0-4     RBC, urine, microscopic 0-6     Bacteria, U Microscopic neg     Mucus, UA large     Epithelial cells, urine per micros 0-7     Crystals, Ur, HPF, POC neg     Casts, Ur, LPF, POC neg     Yeast, UA neg      Some discomfort with side to side movement of neck. Not able to reproduce pain on head with palpation.  Less sensation on ball of right foot compared to heel.    Assessment & Plan:  Bleeding gums  Bleeding  Family history of colon cancer requiring screening colonoscopy  Other malaise and fatigue  Palpitations  Occipital neuralgia - Plan: cyclobenzaprine (FLEXERIL) 5 MG tablet  Paresthesias  Hematuria - Plan: POCT urinalysis dipstick, POCT UA - Microscopic Only, Ambulatory referral to  Urology    Muscle relaxer at bedtime for occipital pain Neuro referral for foot symptoms if no improvement in 3 months. UA. Urology referral for hematuria.

## 2013-07-19 NOTE — Assessment & Plan Note (Signed)
Persistent; s/p hematology consultation; has appointment in three months for follow-up.

## 2013-07-19 NOTE — Assessment & Plan Note (Signed)
Stable; not using Metoprolol.

## 2013-07-19 NOTE — Assessment & Plan Note (Signed)
New. Versus cervical neck spasm.  Rx for Flexeril 5mg  qhs provided to use scheduled for two weeks and then PRN.  If persists, consider cervical spine films and referral to neurology.  Also having migraine symptoms intermittently; multiple personal stressors recently likely trigger.

## 2013-08-17 ENCOUNTER — Telehealth: Payer: Self-pay | Admitting: Radiology

## 2013-08-17 NOTE — Telephone Encounter (Signed)
Dentist([facility]) requesting medical clearance for patient to have an extraction, phone [phone] fax [phone] please advise, if okay I can write the letter for clearance.

## 2013-08-18 NOTE — Telephone Encounter (Signed)
Please call dentist --- they need to get clearance for tooth extraction from patient's hematologist at [facility].

## 2013-08-19 ENCOUNTER — Telehealth: Payer: Self-pay | Admitting: *Deleted

## 2013-08-19 ENCOUNTER — Telehealth: Payer: Self-pay | Admitting: Hematology and Oncology

## 2013-08-19 NOTE — Telephone Encounter (Signed)
Deanna [name] is advised.

## 2013-08-19 NOTE — Telephone Encounter (Signed)
Dr. Kathlene November, DDS requesting Clearance from Hematology for Tooth extraction.  Pt scheduled for this Monday 08/23/13 for extraction.

## 2013-08-19 NOTE — Telephone Encounter (Signed)
added appt for monday...pt already aware pt pof

## 2013-08-19 NOTE — Telephone Encounter (Signed)
Per Dr. Bertis Ruddy,  She can see pt in office on Monday at 11:30 am.  Needs to see pt prior to giving clearance for tooth extraction. Informed pt of appt on Monday.  She agreed and will r/s the tooth extraction until after she sees Dr. Bertis Ruddy.  Notified Jodi at Dr. Jonetta Speak office.

## 2013-08-20 ENCOUNTER — Other Ambulatory Visit: Payer: Self-pay | Admitting: Hematology and Oncology

## 2013-08-23 ENCOUNTER — Ambulatory Visit (HOSPITAL_BASED_OUTPATIENT_CLINIC_OR_DEPARTMENT_OTHER): Payer: 59 | Admitting: Hematology and Oncology

## 2013-08-23 ENCOUNTER — Other Ambulatory Visit: Payer: 59

## 2013-08-23 ENCOUNTER — Telehealth: Payer: Self-pay | Admitting: Hematology and Oncology

## 2013-08-23 VITALS — BP 131/82 | HR 89 | Temp 97.7°F | Resp 20 | Ht 64.5 in | Wt 208.6 lb

## 2013-08-23 DIAGNOSIS — K068 Other specified disorders of gingiva and edentulous alveolar ridge: Secondary | ICD-10-CM

## 2013-08-23 DIAGNOSIS — D689 Coagulation defect, unspecified: Secondary | ICD-10-CM

## 2013-08-23 DIAGNOSIS — K055 Other periodontal diseases: Secondary | ICD-10-CM

## 2013-08-23 MED ORDER — AMINOCAPROIC ACID 500 MG PO TABS: 1.0000 g | ORAL_TABLET | Freq: Four times a day (QID) | ORAL | Status: AC

## 2013-08-23 NOTE — Telephone Encounter (Signed)
gv and printed appt sched an avs for pt for OCT.

## 2013-08-23 NOTE — Progress Notes (Signed)
[facility] OFFICE PROGRESS NOTE  SMITH,KRISTI, MD DIAGNOSIS:  The hematology clearance for tooth extraction  SUMMARY OF HEMATOLOGIC HISTORY: This is a very pleasant 47 year old lady with a bleeding disorder. She was seen by another hematologist will order workup for bleeding disorder and she was found to have abnormal platelet aggregation study, suggestive of platelet dysfunction, subtype unknown. The patient has been complaining of a bad tooth on the left upper molar requiring surgical extraction. Should excessive bleeding tendency since early in age. She always had heavy menstruation needing a partial hysterectomy. Surprisingly she did not have excessive bleeding afterwards. The hysterectomy was about 11 years ago done in a laparoscopic manner The patient also have nasal fracture 19 years ago requiring blood transfusion She had removal of the skin cancer over the right breast area with excessive bleeding afterwards Should no family history of bleeding disorder. INTERVAL HISTORY: [name] 47 y.o. female returns for further evaluation. She has been having gum bleeding on a regular basis. She also had hemorrhoidal bleeding once every few weeks. Denies any recent nose bleed. She has occasional hematuria and had negative urology workup for this. Her left upper molar tooth has been hurting quite a bit and she desired to have it extracted but due to her bleeding disorder she require clearance I have reviewed the past medical history, past surgical history, social history and family history with the patient and they are unchanged from previous note.  ALLERGIES:  has No Known Allergies.  MEDICATIONS:  Current Outpatient Prescriptions  Medication Sig Dispense Refill  . ALPRAZolam (XANAX) 0.5 MG tablet Take 0.5-1 tablets (0.25-0.5 mg total) by mouth daily as needed for sleep.  30 tablet  0  . cholecalciferol (VITAMIN D) 1000 UNITS tablet Take 4,000 Units by mouth daily.        . Multiple Vitamin (MULTIVITAMIN) capsule Take 1 capsule by mouth daily.      Melene Muller ON 08/31/2013] aminocaproic acid (AMICAR) 500 MG tablet Take 2 tablets (1 g total) by mouth every 6 (six) hours.  30 tablet  1  . Na Sulfate-K Sulfate-Mg Sulf (SUPREP BOWEL PREP) SOLN Use as directed  2 Bottle  0   No current facility-administered medications for this visit.     REVIEW OF SYSTEMS:   Constitutional: Denies fevers, chills or night sweats Eyes: Denies blurriness of vision Ears, nose, mouth, throat, and face: Denies mucositis or sore throat Respiratory: Denies cough, dyspnea or wheezes Cardiovascular: Denies palpitation, chest discomfort or lower extremity swelling Gastrointestinal:  Denies nausea, heartburn or change in bowel habits Skin: Denies abnormal skin rashes Lymphatics: Denies new lymphadenopathy or easy bruising Neurological:Denies numbness, tingling or new weaknesses Behavioral/Psych: Mood is stable, no new changes  All other systems were reviewed with the patient and are negative.  PHYSICAL EXAMINATION: ECOG PERFORMANCE STATUS: 0 - Asymptomatic  Filed Vitals:   08/23/13 1108  BP: 131/82  Pulse: 89  Temp: 97.7 F (36.5 C)  Resp: 20   Filed Weights   08/23/13 1108  Weight: 208 lb 9.6 oz (94.62 kg)    GENERAL:alert, no distress and comfortable SKIN: skin color, texture, turgor are normal, no rashes or significant lesions EYES: normal, Conjunctiva are pink and non-injected, sclera clear OROPHARYNX:no exudate, no erythema and lips, buccal mucosa, and tongue normal . The left upper molar show some signs of decay at the root. NECK: supple, thyroid normal size, non-tender, without nodularity LYMPH:  no palpable lymphadenopathy in the cervical, axillary or inguinal LUNGS: clear to  auscultation and percussion with normal breathing effort HEART: regular rate & rhythm and no murmurs and no lower extremity edema ABDOMEN:abdomen soft, non-tender and normal bowel  sounds Musculoskeletal:no cyanosis of digits and no clubbing  NEURO: alert & oriented x 3 with fluent speech, no focal motor/sensory deficits  LABORATORY DATA:  I have reviewed the data as listed A review of her prior workup. 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 The patient stated that she cannot wait to have this tooth removed. Surprisingly, she did well with a laparoscopic hysterectomy many years ago. I told the patient that is no treatment available for storage pool disease apart from platelet transfusions. I also recommend a trial of Amicar as a clot stabilizing agent in the morning of her planned extraction and to see me in the afternoon on the same day next week. I recommend she take Amicar round the clock for at least 3 days a see we can help avoid the need for transfusion and excessive bleeding. The patient would need to be seen in the hemophilia center for further workup about her bleeding disorder. He would also have an implication about genetic testing/screening for her children. The patient will think about it. I will see her back next week in the afternoon after her tooth extraction for further followup.  All questions were answered. The patient knows to call the clinic with any problems, questions or concerns. No barriers to learning was detected.  Cordie Buening, MD 08/23/2013 1:05 PM

## 2013-08-31 ENCOUNTER — Ambulatory Visit (HOSPITAL_BASED_OUTPATIENT_CLINIC_OR_DEPARTMENT_OTHER): Payer: 59 | Admitting: Hematology and Oncology

## 2013-08-31 ENCOUNTER — Encounter: Payer: Self-pay | Admitting: Hematology and Oncology

## 2013-08-31 VITALS — BP 135/79 | HR 68 | Temp 98.0°F | Resp 20 | Ht 64.5 in | Wt 208.6 lb

## 2013-08-31 DIAGNOSIS — K055 Other periodontal diseases: Secondary | ICD-10-CM

## 2013-08-31 DIAGNOSIS — D699 Hemorrhagic condition, unspecified: Secondary | ICD-10-CM

## 2013-08-31 HISTORY — DX: Hemorrhagic condition, unspecified: D69.9

## 2013-08-31 NOTE — Progress Notes (Signed)
[facility] OFFICE PROGRESS NOTE  SMITH,KRISTI, MD DIAGNOSIS:  Bleeding disorder, unspecified subtype  SUMMARY OF HEMATOLOGIC HISTORY: Please see my recent dictation for further details. This patient had an unspecified bleeding disorder, with abnormal platelet aggregation study. INTERVAL HISTORY: Deanna [name] 47 y.o. female returns for further followup. She had dental extraction this morning. She had minor bleeding. I placed her on Amicar since this morning.  I have reviewed the past medical history, past surgical history, social history and family history with the patient and they are unchanged from previous note.  ALLERGIES:  has No Known Allergies.  MEDICATIONS:  Current Outpatient Prescriptions  Medication Sig Dispense Refill  . ALPRAZolam (XANAX) 0.5 MG tablet Take 0.5-1 tablets (0.25-0.5 mg total) by mouth daily as needed for sleep.  30 tablet  0  . aminocaproic acid (AMICAR) 500 MG tablet Take 2 tablets (1 g total) by mouth every 6 (six) hours.  30 tablet  1  . cholecalciferol (VITAMIN D) 1000 UNITS tablet Take 4,000 Units by mouth daily.       . Multiple Vitamin (MULTIVITAMIN) capsule Take 1 capsule by mouth daily.      . Na Sulfate-K Sulfate-Mg Sulf (SUPREP BOWEL PREP) SOLN Use as directed  2 Bottle  0   No current facility-administered medications for this visit.     REVIEW OF SYSTEMS:   Constitutional: Denies fevers, chills or night sweats All other systems were reviewed with the patient and are negative.  PHYSICAL EXAMINATION: ECOG PERFORMANCE STATUS: 0 - Asymptomatic  Filed Vitals:   08/31/13 1303  BP: 135/79  Pulse: 68  Temp: 98 F (36.7 C)  Resp: 20   Filed Weights   08/31/13 1303  Weight: 208 lb 9.6 oz (94.62 kg)    OROPHARYNX: Very mild oozing from the upper gums area  ASSESSMENT:  Bleeding disorder  PLAN:  #1 bleeding disorder #2 recent tooth extraction She is doing well with no excessive bleeding. We have a lot of  discussion about referring her to a tertiary center for evaluation. I cannot help her not knowing the specific subtype of her platelet disorder. We looked at her von Willebrand panel which show mildly reduced factor VIII activity but her von Willebrand panel was normal. She has grossly abnormal platelet aggregation study. Rarely, type 2N von Willebrand's disease can present in similar manner. Ideally, we would like to order an additional testing to find out the subtype of her platelet disorder but I do not have the capability here to help her. I recommend second opinion elsewhere. The patient will discuss this with her husband for possible tertiary referral. In the meantime will continue Amicar for the next few days. The patient will call me end of the week for an update. All questions were answered. The patient knows to call the clinic with any problems, questions or concerns. No barriers to learning was detected.  I spent 15 minutes counseling the patient face to face. The total time spent in the appointment was 20 minutes and more than 50% was on counseling.     Yigit Norkus, MD 08/31/2013 1:49 PM

## 2013-09-02 ENCOUNTER — Telehealth: Payer: Self-pay | Admitting: Hematology and Oncology

## 2013-09-02 NOTE — Telephone Encounter (Signed)
sw pt made aware of 11/20 appts shh

## 2013-09-09 ENCOUNTER — Other Ambulatory Visit: Payer: Self-pay

## 2013-09-09 ENCOUNTER — Telehealth: Payer: Self-pay | Admitting: *Deleted

## 2013-09-09 NOTE — Telephone Encounter (Signed)
Pt asks if Dr. Bertis Ruddy has found out if the specialized lab tests she wants done could be drawn at our lab and sent out?  She also asks what tests Dr. Bertis Ruddy thinks needs to be done when she is referred to a Specialist?  Pt asks if there is anywhere else in [facility] besides [facility] Dr. Bertis Ruddy thinks would be appropriate to refer her?

## 2013-09-10 NOTE — Telephone Encounter (Signed)
I prefer not to redraw any more labs because sometimes samples get degraded with transport and most tests are now done in specialist centers I would not recommend [facility] or [facility]. The best place in [facility] is [facility]

## 2013-09-10 NOTE — Telephone Encounter (Signed)
Left pt VM informing her of Dr. Maxine Glenn reply below.  Asked her to let us know where she wants Dr. Bertis Ruddy to refer her and if any other questions.

## 2013-09-16 ENCOUNTER — Other Ambulatory Visit: Payer: Self-pay | Admitting: *Deleted

## 2013-09-16 DIAGNOSIS — R319 Hematuria, unspecified: Secondary | ICD-10-CM

## 2013-09-16 DIAGNOSIS — D699 Hemorrhagic condition, unspecified: Secondary | ICD-10-CM

## 2013-09-16 DIAGNOSIS — K068 Other specified disorders of gingiva and edentulous alveolar ridge: Secondary | ICD-10-CM

## 2013-09-16 DIAGNOSIS — R58 Hemorrhage, not elsewhere classified: Secondary | ICD-10-CM

## 2013-09-16 DIAGNOSIS — D649 Anemia, unspecified: Secondary | ICD-10-CM

## 2013-09-20 ENCOUNTER — Telehealth: Payer: Self-pay | Admitting: Hematology and Oncology

## 2013-09-20 NOTE — Telephone Encounter (Signed)
Faxed pt medical records to [facility] hemophilia center. After review the office will call with appt.

## 2013-09-22 ENCOUNTER — Telehealth: Payer: Self-pay | Admitting: *Deleted

## 2013-09-22 NOTE — Telephone Encounter (Signed)
Pt asks if she is still supposed to see Dr. Bertis Ruddy tomorrow as scheduled since she has been referred to [facility]?

## 2013-09-22 NOTE — Telephone Encounter (Signed)
No need I have cancelled her appt in the computer

## 2013-09-22 NOTE — Telephone Encounter (Signed)
Informed pt of appts canceled.  She verbalized understanding.

## 2013-09-23 ENCOUNTER — Other Ambulatory Visit: Payer: 59 | Admitting: Lab

## 2013-09-23 ENCOUNTER — Ambulatory Visit: Payer: 59 | Admitting: Hematology and Oncology

## 2013-09-28 ENCOUNTER — Telehealth: Payer: Self-pay | Admitting: *Deleted

## 2013-09-28 NOTE — Telephone Encounter (Signed)
Pt states has not heard about appt at [facility] clinic.  Informed pt that referral was made and I will f/u w/ Medical Records to check status of referral.  Left Vm for [name] Asking her to f/u on this referral for pt.Deanna [name]

## 2013-10-05 ENCOUNTER — Telehealth: Payer: Self-pay | Admitting: Hematology and Oncology

## 2013-10-05 NOTE — Telephone Encounter (Signed)
PT APPT. WITH DR MA AT [facility] IS 10/15/13@10 :00. PT IS AWARE

## 2013-10-15 DIAGNOSIS — R5383 Other fatigue: Secondary | ICD-10-CM | POA: Insufficient documentation

## 2013-11-22 ENCOUNTER — Ambulatory Visit: Payer: 59 | Admitting: Family Medicine

## 2013-12-29 ENCOUNTER — Ambulatory Visit: Payer: 59 | Admitting: Family Medicine

## 2014-04-18 ENCOUNTER — Telehealth: Payer: Self-pay

## 2014-04-18 NOTE — Telephone Encounter (Signed)
Call --- needs OV for evaluation and for ordering diagnostic mammogram and breast ultrasound.  The mammogram facility will want an detailed description of the location of the mass.  Recommend walking into 102 or scheduling an appointment with a provider who has an open appointment this week at [facility].

## 2014-04-18 NOTE — Telephone Encounter (Signed)
Rtn call to pt- Pt found a mass in her right breast. She thinks it is about the size of a quarter. She states it has tenderness to palpitations. She states that a couple of years ago she had a mole removed that was indeterminate melanoma.  She had a mammogram done in June last year at the [place] [facility] from [facility]. She states that she was diagnosed with "thick breast" tissue. She wants the next available appt.

## 2014-04-18 NOTE — Telephone Encounter (Signed)
Pt has found a lump and would like dr Tamala Julian to refer for a mammogram ultrasound best number is [phone]

## 2014-04-20 ENCOUNTER — Other Ambulatory Visit: Payer: Self-pay

## 2014-04-20 DIAGNOSIS — N63 Unspecified lump in unspecified breast: Secondary | ICD-10-CM

## 2014-04-20 NOTE — Telephone Encounter (Signed)
Notified pt that she must come in to be evaluated.  She stated that she is very anxious and probably will try and schedule a screening to do it faster than coming in here to be seen.  Advised to come in again.

## 2014-04-26 ENCOUNTER — Encounter: Payer: 59 | Admitting: Family Medicine

## 2014-05-11 ENCOUNTER — Ambulatory Visit (INDEPENDENT_AMBULATORY_CARE_PROVIDER_SITE_OTHER): Payer: 59 | Admitting: Family Medicine

## 2014-05-11 ENCOUNTER — Encounter: Payer: Self-pay | Admitting: Family Medicine

## 2014-05-11 VITALS — BP 120/80 | HR 67 | Temp 99.0°F | Resp 16 | Ht 64.25 in | Wt 170.0 lb

## 2014-05-11 DIAGNOSIS — N63 Unspecified lump in unspecified breast: Secondary | ICD-10-CM

## 2014-05-11 DIAGNOSIS — N644 Mastodynia: Secondary | ICD-10-CM

## 2014-05-11 DIAGNOSIS — N6310 Unspecified lump in the right breast, unspecified quadrant: Secondary | ICD-10-CM

## 2014-05-11 DIAGNOSIS — D691 Qualitative platelet defects: Secondary | ICD-10-CM

## 2014-05-11 DIAGNOSIS — R202 Paresthesia of skin: Secondary | ICD-10-CM

## 2014-05-11 DIAGNOSIS — Z131 Encounter for screening for diabetes mellitus: Secondary | ICD-10-CM

## 2014-05-11 DIAGNOSIS — Z1239 Encounter for other screening for malignant neoplasm of breast: Secondary | ICD-10-CM | POA: Insufficient documentation

## 2014-05-11 DIAGNOSIS — Z0000 Encounter for general adult medical examination without abnormal findings: Secondary | ICD-10-CM

## 2014-05-11 DIAGNOSIS — R209 Unspecified disturbances of skin sensation: Secondary | ICD-10-CM

## 2014-05-11 DIAGNOSIS — Z13220 Encounter for screening for lipoid disorders: Secondary | ICD-10-CM

## 2014-05-11 DIAGNOSIS — D699 Hemorrhagic condition, unspecified: Secondary | ICD-10-CM

## 2014-05-11 LAB — POCT URINALYSIS DIPSTICK
BILIRUBIN UA: NEGATIVE
GLUCOSE UA: NEGATIVE
Ketones, UA: NEGATIVE
Leukocytes, UA: NEGATIVE
Nitrite, UA: NEGATIVE
Protein, UA: NEGATIVE
SPEC GRAV UA: 1.02
Urobilinogen, UA: 0.2
pH, UA: 5.5

## 2014-05-11 LAB — CBC WITH DIFFERENTIAL/PLATELET
Basophils Absolute: 0 10*3/uL (ref 0.0–0.1)
Basophils Relative: 0 % (ref 0–1)
EOS PCT: 2 % (ref 0–5)
Eosinophils Absolute: 0.1 10*3/uL (ref 0.0–0.7)
HCT: 39.4 % (ref 36.0–46.0)
HEMOGLOBIN: 13.2 g/dL (ref 12.0–15.0)
LYMPHS ABS: 1.1 10*3/uL (ref 0.7–4.0)
LYMPHS PCT: 17 % (ref 12–46)
MCH: 29.5 pg (ref 26.0–34.0)
MCHC: 33.5 g/dL (ref 30.0–36.0)
MCV: 88.1 fL (ref 78.0–100.0)
MONOS PCT: 7 % (ref 3–12)
Monocytes Absolute: 0.5 10*3/uL (ref 0.1–1.0)
Neutro Abs: 4.8 10*3/uL (ref 1.7–7.7)
Neutrophils Relative %: 74 % (ref 43–77)
PLATELETS: 330 10*3/uL (ref 150–400)
RBC: 4.47 MIL/uL (ref 3.87–5.11)
RDW: 13.7 % (ref 11.5–15.5)
WBC: 6.5 10*3/uL (ref 4.0–10.5)

## 2014-05-11 LAB — COMPLETE METABOLIC PANEL WITH GFR
ALBUMIN: 4.3 g/dL (ref 3.5–5.2)
ALT: 9 U/L (ref 0–35)
AST: 13 U/L (ref 0–37)
Alkaline Phosphatase: 57 U/L (ref 39–117)
BILIRUBIN TOTAL: 0.4 mg/dL (ref 0.2–1.2)
BUN: 10 mg/dL (ref 6–23)
CO2: 26 mEq/L (ref 19–32)
Calcium: 9.1 mg/dL (ref 8.4–10.5)
Chloride: 104 mEq/L (ref 96–112)
Creat: 0.66 mg/dL (ref 0.50–1.10)
GFR, Est African American: >89 mL/min
GFR, Est Non African American: >89 mL/min
Glucose, Bld: 78 mg/dL (ref 70–99)
POTASSIUM: 4.1 meq/L (ref 3.5–5.3)
Sodium: 139 mEq/L (ref 135–145)
Total Protein: 7.4 g/dL (ref 6.0–8.3)

## 2014-05-11 LAB — LIPID PANEL
CHOLESTEROL: 200 mg/dL (ref 0–200)
HDL: 58 mg/dL (ref >39)
LDL Cholesterol: 128 mg/dL — ABNORMAL HIGH (ref 0–99)
Total CHOL/HDL Ratio: 3.4 Ratio
Triglycerides: 68 mg/dL (ref <150)
VLDL: 14 mg/dL (ref 0–40)

## 2014-05-11 LAB — HEMOGLOBIN A1C
HEMOGLOBIN A1C: 5.3 % (ref <5.7)
MEAN PLASMA GLUCOSE: 105 mg/dL (ref <117)

## 2014-05-11 LAB — TSH: TSH: 4.543 u[IU]/mL — AB (ref 0.350–4.500)

## 2014-05-11 NOTE — Patient Instructions (Addendum)
1. Call Dr. Carlean Purl to schedule colonoscopy. 2.  Call hematology at [facility] for recommendation on follow-up. 3.  Call [facility] for appointment.  Emergency bracelet:  Platelet storage disorder. If suffering with bleeding, administer platelets or DDAVP.

## 2014-05-11 NOTE — Progress Notes (Signed)
This chart was scribed for Deanna Honour, MD by Elby Beck, Scribe. This patient was seen in room 21 and the patient's care was started at 8:56 AM.  Subjective:    Patient ID: Deanna [name], female    DOB: May 26, 1966, 48 y.o.   MRN: [id number]  05/11/2014  Annual Exam   HPI  HPI Comments: Deanna [name] is a 48 y.o. female who presents to the [facility] for an annual physical examination. She states that it has been "a while" since her last physical examination. She has had a partial hysterectomy 8-9 years ago due to excessive vaginal bleeding and she was also told she had adenomyosis. She still has her ovaries, and denies any history of cervical or uterine cancer. Her most recent mammogram on 04/12/13 was normal. She states that she had a quarter-sized, very firm lump on her breast recently which mostly resolved about 2 weeks ago- although she is still having pain to the area. She last had her Tdap booster in 2010. Her most recent colonoscopy was on 08/18/07. At that time, she was recommended to have a repeat colonoscopy in 5 years. She has not followed up on this yet. She has lost 38 pounds since October of 2014. She states that she wants to lose at least another 20 pounds. She states that she has been more active and eating less. She states that she receives yearly flu vaccines. She has a history of bleeding gums and she regularly sees her dentist every 3 months. She is not followed by an optometrist and says she will make plans to set up eye care. She has a history of melanoma vs. dysplastic nevus of her breast which was surgically removed.   She states that her 36 y.o. Mother has a history of HTN and adult-onset DM. She states that her mother was diagnosed with colon cancer at age 66. She states that her father died at 69. She reports that her father had his first stroke at 25. She has 1 living sister who is 71 years old and has thyroid issues. She has a deceased  sister who died at age 65 of colon cancer- she was diagnosed with colon cancer at age 47.  She has 1 deceased half sister, who passed around age 61-70 of her first MI. She has had 6 half-brothers. One half brother died around 69-60 of a massive MI. Another half-brother, who was a smoker, died in his late-60s of soft tissue cancer in his throat and neck. Another half-brother died of bone cancer in his 16s-60s. Another half-brother died of an MI around age 53- he had multiple prior heart attacks before the final one.  She has been happily married for 23 years. She denies any spousal abuse. She has children aged 40 and 31. She states that she has no grandchildren. She has been working with [employer] for 4 years and she likes her job. She denies smoking, drinking or illicit drug use. As for exercise, she states that she walks almost daily, and she occasionally strength trains. She reports that she regularly wears her seatbelt. She regularly wears SPF 30+ sunscreen. There are no guns in her home.   She was on Alprazolam last year after the tragic death of her nephew, but states that she no longer needs this medication. She takes Vitamin D and a multivitamin every day. She has a history of abnormal platelet function and has a prescription for an extra-strength DDAVP  nasal spray. She attributes the excessive vaginal bleeding which required her to have a hysterectomy and the bleeding she has in her gums to this abnormal platelet function.  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 She reports associated occasional tinnitus in the left ear. She states that she occasionally experiences dizziness. She states that her left eye is weaker than the right eye, and that this has been the case since childhood. She states that she occasionally has palpitations. She has been evaluated for this and was diagnosed with PVCs. She was told that she does not need to be  followed by a cardiologist.  She states that her energy levels are somewhat low and that she does not usually wake up refreshed. She states that she is unsure if she snores. She states that she has a constant tingling in her bilateral feet, which is sometimes described as a burning sensation. She denies any numbness in her feet. She states that less frequently, she experiences a similar tingling in her hands. She also states that she occasionally has back pain. She denies denies experiencing headaches, chest pain, SOB, persistent cough or neck pain.   Review of Systems  HENT: Positive for hearing loss (conductive, left ear) and tinnitus (occasional, left ear).        Bleeding of gums  Respiratory: Negative for cough.   Cardiovascular: Positive for palpitations. Negative for chest pain.  Musculoskeletal: Positive for back pain. Negative for neck pain.  Skin:       Breast lump  Neurological: Positive for dizziness. Negative for headaches.       Paresthesias in bilateral feet    Past Medical History  Diagnosis Date   Thyroid disease     Post-partum   Dysplastic nevus     followed yearly by [facility].   Bleeding gums    Hx of transfusion of packed red blood cells    Bleeding disorder 08/31/2013   Clotting disorder    Past Surgical History  Procedure Laterality Date   Partial hysterectomy  11/04/2004    DUB/adenomyosis. Ovaries intact.  [name]   Resection dysplastic nevus     Nasal fracture surgery      post-op hemorrhage   Breast surgery      removal of a severely dysplastic nevus vs. melanoma   Fracture surgery     Abdominal hysterectomy      No Known Allergies Current Outpatient Prescriptions  Medication Sig Dispense Refill   cholecalciferol (VITAMIN D) 1000 UNITS tablet Take 4,000 Units by mouth daily.        Multiple Vitamin (MULTIVITAMIN) capsule Take 1 capsule by mouth daily.       ALPRAZolam (XANAX) 0.5 MG tablet Take 0.5-1 tablets (0.25-0.5  mg total) by mouth daily as needed for sleep.  30 tablet  0   No current facility-administered medications for this visit.   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      Children: 2 children (19, 17); no grandchildren.      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      Tobacco:  None      Alcohol: none      Drugs: none      Exercise:  Walking, strength training daily.      Seatbelt: 100%      Guns: none       Sunscreen:  SPF 30.   Family History  Problem Relation Age of Onset   Colon cancer Mother    Diabetes Mother    Hypertension Mother    Cancer Mother 23    colon cancer   Dementia Mother    Stroke Father 46   Hypertension Brother    Heart disease Brother    Thyroid disease Sister    Colon cancer Sister    Cancer Sister 63    colon cancer   COPD Sister    Heart disease Sister 53    AMI   Cancer Brother 4    soft tissue cancer in throat/neck   Cancer Brother 68    bone cancer   Heart disease Brother     multiple AMI       Objective:    BP 120/80   Pulse 67   Temp(Src) 99 F (37.2 C) (Oral)   Resp 16   Ht 5' 4.25" (1.632 m)   Wt 170 lb (77.111 kg)   BMI 28.95 kg/m2   SpO2 100% Physical Exam  Nursing note and vitals reviewed. Constitutional: She is oriented to person, place, and time. She appears well-developed and well-nourished. No distress.  HENT:  Head: Normocephalic and atraumatic.  Right Ear: External ear normal.  Left Ear:  External ear normal.  Nose: Nose normal.  Mouth/Throat: Oropharynx is clear and moist.  Eyes: Conjunctivae and EOM are normal. Pupils are equal, round, and reactive to light.  Neck: Normal range of motion and full passive range of motion without pain. Neck supple. No JVD present. Carotid bruit is not present. No tracheal deviation present. No thyromegaly present.  Cardiovascular: Normal rate, regular rhythm and normal heart sounds.  Exam reveals no gallop and no friction rub.   No murmur heard. Pulmonary/Chest: Effort normal and breath sounds normal. No respiratory distress. She has no wheezes. She has no rales. Right breast exhibits no inverted nipple, no mass, no nipple discharge, no skin change and no tenderness. Left breast exhibits no inverted nipple, no mass, no nipple discharge, no skin change and no tenderness. Breasts are symmetrical.  Abdominal: Soft. Bowel sounds are normal. She exhibits no distension and no mass. There is no tenderness. There is no rebound and no guarding.  Genitourinary: Vagina normal. There is no rash, tenderness or lesion on the right labia. There is no rash, tenderness or lesion on the left labia. Right adnexum displays no mass, no tenderness and no fullness. Left adnexum displays no mass, no tenderness and no fullness.  Musculoskeletal: Normal range of motion.       Right shoulder: Normal.       Left shoulder: Normal.       Cervical back: Normal.  Lymphadenopathy:    She has no cervical adenopathy.  Neurological: She is alert and oriented to person, place, and time. She has normal reflexes. No cranial nerve deficit. She exhibits normal muscle tone. Coordination normal.  Skin: Skin  is warm and dry. No rash noted. She is not diaphoretic. No erythema. No pallor.  Psychiatric: She has a normal mood and affect. Her behavior is normal. Judgment and thought content normal.   Results for orders placed in visit on 05/11/14  CBC WITH DIFFERENTIAL      Result Value Ref  Range   WBC 6.5  4.0 - 10.5 K/uL   RBC 4.47  3.87 - 5.11 MIL/uL   Hemoglobin 13.2  12.0 - 15.0 g/dL   HCT 39.4  36.0 - 46.0 %   MCV 88.1  78.0 - 100.0 fL   MCH 29.5  26.0 - 34.0 pg   MCHC 33.5  30.0 - 36.0 g/dL   RDW 13.7  11.5 - 15.5 %   Platelets 330  150 - 400 K/uL   Neutrophils Relative % 74  43 - 77 %   Neutro Abs 4.8  1.7 - 7.7 K/uL   Lymphocytes Relative 17  12 - 46 %   Lymphs Abs 1.1  0.7 - 4.0 K/uL   Monocytes Relative 7  3 - 12 %   Monocytes Absolute 0.5  0.1 - 1.0 K/uL   Eosinophils Relative 2  0 - 5 %   Eosinophils Absolute 0.1  0.0 - 0.7 K/uL   Basophils Relative 0  0 - 1 %   Basophils Absolute 0.0  0.0 - 0.1 K/uL   Smear Review Criteria for review not met    COMPLETE METABOLIC PANEL WITH GFR      Result Value Ref Range   Sodium 139  135 - 145 mEq/L   Potassium 4.1  3.5 - 5.3 mEq/L   Chloride 104  96 - 112 mEq/L   CO2 26  19 - 32 mEq/L   Glucose, Bld 78  70 - 99 mg/dL   BUN 10  6 - 23 mg/dL   Creat 0.66  0.50 - 1.10 mg/dL   Total Bilirubin 0.4  0.2 - 1.2 mg/dL   Alkaline Phosphatase 57  39 - 117 U/L   AST 13  0 - 37 U/L   ALT 9  0 - 35 U/L   Total Protein 7.4  6.0 - 8.3 g/dL   Albumin 4.3  3.5 - 5.2 g/dL   Calcium 9.1  8.4 - 10.5 mg/dL   GFR, Est African American >89     GFR, Est Non African American >89    TSH      Result Value Ref Range   TSH 4.543 (*) 0.350 - 4.500 uIU/mL  HEMOGLOBIN A1C      Result Value Ref Range   Hemoglobin A1C 5.3  <5.7 %   Mean Plasma Glucose 105  <117 mg/dL  LIPID PANEL      Result Value Ref Range   Cholesterol 200  0 - 200 mg/dL   Triglycerides 68  <150 mg/dL   HDL 58  >39 mg/dL   Total CHOL/HDL Ratio 3.4     VLDL 14  0 - 40 mg/dL   LDL Cholesterol 128 (*) 0 - 99 mg/dL  POCT URINALYSIS DIPSTICK      Result Value Ref Range   Color, UA yellow     Clarity, UA clear     Glucose, UA neg     Bilirubin, UA neg     Ketones, UA neg     Spec Grav, UA 1.020     Blood, UA small     pH, UA 5.5     Protein, UA neg  Urobilinogen, UA 0.2     Nitrite, UA neg     Leukocytes, UA Negative         Assessment & Plan:  Routine general medical examination at a health care facility - Plan: CBC with Differential, COMPLETE METABOLIC PANEL WITH GFR, TSH, Hemoglobin A1c, Lipid panel, POCT urinalysis dipstick, CANCELED: EKG 12-Lead  Breast mass, right - Plan: MM Digital Diagnostic Bilat, US Breast Bilateral  Breast tenderness - Plan: MM Digital Diagnostic Bilat, US Breast Bilateral  Tingling in extremities - Plan: Ambulatory referral to Neurology  Bleeding disorder  Platelet dysfunction  1. CPE: anticipatory guidance --- recommend further weight loss and exercise.  No longer warrants pap smears due to hysterectomy status.  Refer for diagnostic mammogram.  Patient to schedule colonoscopy in upcoming six months.  Immunizations UTD. 2.  Gynecological exam: completed in office; refer for diagnostic mammogram; no longer warrants pap smears. 3.  R breast mass with tenderness:  New. Refer for diagnostic mammogram and breast u/s. 4.  Paresthesias B feet:  New. Obtain labs; refer to neurology.  5.  Fatigue: persistent; continue to work on weight loss and exercise. If no improvement with further weight loss, recommend sleep study. 6.  Platelet storage disorder: New. S/p consultation by [facility] hematology; rx for DDAVP provided; not sure about follow-up; advised patient to contact [facility] hematology regarding appropriate follow-up.  No orders of the defined types were placed in this encounter.    Return in about 1 year (around 05/12/2015) for complete physical examiniation.   I personally performed the services described in this documentation, which was scribed in my presence.  The recorded information has been reviewed and is accurate.  Reginia Forts, M.D.  [facility] [address] [place], [place]  [address] [phone] phone [phone] fax

## 2014-05-11 NOTE — Progress Notes (Signed)
   Subjective:    Patient ID: Deanna [name], female    DOB: [birth year], 48 y.o.   MRN: [id number]  HPI    Review of Systems  Constitutional: Negative.   HENT: Negative.   Eyes: Negative.   Respiratory: Negative.   Cardiovascular: Positive for palpitations.  Gastrointestinal: Positive for constipation and blood in stool.  Endocrine: Negative.   Genitourinary: Negative.   Musculoskeletal: Negative.   Skin: Negative.   Allergic/Immunologic: Negative.   Neurological: Positive for numbness.  Hematological: Bruises/bleeds easily.  Psychiatric/Behavioral: Negative.        Objective:   Physical Exam        Assessment & Plan:

## 2014-05-12 ENCOUNTER — Other Ambulatory Visit: Payer: Self-pay | Admitting: Family Medicine

## 2014-05-12 DIAGNOSIS — N644 Mastodynia: Secondary | ICD-10-CM

## 2014-05-12 DIAGNOSIS — N6310 Unspecified lump in the right breast, unspecified quadrant: Secondary | ICD-10-CM

## 2014-05-16 DIAGNOSIS — D691 Qualitative platelet defects: Secondary | ICD-10-CM | POA: Insufficient documentation

## 2014-05-18 ENCOUNTER — Ambulatory Visit (INDEPENDENT_AMBULATORY_CARE_PROVIDER_SITE_OTHER): Payer: 59

## 2014-05-18 ENCOUNTER — Encounter: Payer: Self-pay | Admitting: Family Medicine

## 2014-05-18 ENCOUNTER — Telehealth: Payer: Self-pay | Admitting: Neurology

## 2014-05-18 ENCOUNTER — Ambulatory Visit (INDEPENDENT_AMBULATORY_CARE_PROVIDER_SITE_OTHER): Payer: 59 | Admitting: Neurology

## 2014-05-18 ENCOUNTER — Encounter: Payer: Self-pay | Admitting: Neurology

## 2014-05-18 VITALS — BP 112/76 | HR 68 | Ht 65.0 in | Wt 170.0 lb

## 2014-05-18 DIAGNOSIS — R202 Paresthesia of skin: Secondary | ICD-10-CM

## 2014-05-18 DIAGNOSIS — G2581 Restless legs syndrome: Secondary | ICD-10-CM

## 2014-05-18 DIAGNOSIS — R209 Unspecified disturbances of skin sensation: Secondary | ICD-10-CM

## 2014-05-18 HISTORY — DX: Restless legs syndrome: G25.81

## 2014-05-18 NOTE — Telephone Encounter (Signed)
I called the patient. The NCV study was normal. I will call when the blood work results are available.

## 2014-05-18 NOTE — Procedures (Signed)
     HISTORY:  Deanna [name] is a 48 year old patient with a one-year history of tingling and dysesthesias involving the feet, with intermittent symptoms involving the hands. The patient is being evaluated for a possible peripheral neuropathy.  NERVE CONDUCTION STUDIES:  Nerve conduction studies were performed on the right upper extremity. The distal motor latencies and motor amplitudes for the median and ulnar nerves were within normal limits. The F wave latencies and nerve conduction velocities for these nerves were also normal. The sensory latencies for the median and ulnar nerves were normal.  Nerve conduction studies were performed on both lower extremities. The distal motor latencies and motor amplitudes for the peroneal and posterior tibial nerves were within normal limits. The nerve conduction velocities for these nerves were also normal. The H reflex latencies were normal. The sensory latencies for the peroneal nerves were within normal limits.   EMG STUDIES:  EMG evaluation was not performed.  IMPRESSION:  Nerve conduction studies were performed involving the right upper extremity and both lower extremities. These studies were unremarkable, without evidence of a peripheral neuropathy. An early peripheral neuropathy or a small fiber neuropathy may be missed by a standard nerve conduction studies, however. Clinical correlation is required.  Jill Alexanders MD 05/18/2014 1:52 PM  [facility] [address] [place], [place] [address]  Phone [phone] Fax [phone]
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
## 2014-05-19 ENCOUNTER — Ambulatory Visit
Admission: RE | Admit: 2014-05-19 | Discharge: 2014-05-19 | Disposition: A | Discharge status: Home / Self Care | Payer: 59 | Source: Ambulatory Visit | Attending: Family Medicine | Admitting: Family Medicine

## 2014-05-19 DIAGNOSIS — N6310 Unspecified lump in the right breast, unspecified quadrant: Secondary | ICD-10-CM

## 2014-05-19 DIAGNOSIS — N644 Mastodynia: Secondary | ICD-10-CM

## 2014-05-20 ENCOUNTER — Telehealth: Payer: Self-pay | Admitting: Neurology

## 2014-05-20 LAB — IFE AND PE, SERUM
ALBUMIN SERPL ELPH-MCNC: 4 g/dL (ref 3.2–5.6)
ALPHA2 GLOB SERPL ELPH-MCNC: 0.8 g/dL (ref 0.4–1.2)
Albumin/Glob SerPl: 1.2 (ref 0.7–2.0)
Alpha 1: 0.3 g/dL (ref 0.1–0.4)
B-Globulin SerPl Elph-Mcnc: 1 g/dL (ref 0.6–1.3)
GAMMA GLOB SERPL ELPH-MCNC: 1.5 g/dL (ref 0.5–1.6)
Globulin, Total: 3.6 g/dL (ref 2.0–4.5)
IgA/Immunoglobulin A, Serum: 288 mg/dL (ref 91–414)
IgG (Immunoglobin G), Serum: 1420 mg/dL (ref 700–1600)
IgM (Immunoglobulin M), Srm: 101 mg/dL (ref 40–230)
Total Protein: 7.6 g/dL (ref 6.0–8.5)

## 2014-05-20 LAB — ANGIOTENSIN CONVERTING ENZYME: ANGIO CONVERT ENZYME: 42 U/L (ref 14–82)

## 2014-05-20 LAB — RHEUMATOID FACTOR: Rhuematoid fact SerPl-aCnc: 12.3 IU/mL (ref 0.0–13.9)

## 2014-05-20 LAB — VITAMIN B12: VITAMIN B 12: 417 pg/mL (ref 211–946)

## 2014-05-20 LAB — COPPER, SERUM: Copper: 115 ug/dL (ref 72–166)

## 2014-05-20 LAB — SEDIMENTATION RATE: SED RATE: 3 mm/h (ref 0–32)

## 2014-05-20 LAB — ANA W/REFLEX: Anti Nuclear Antibody(ANA): NEGATIVE

## 2014-05-20 NOTE — Telephone Encounter (Signed)
I called patient. The blood work is unremarkable. Nerve conduction studies were unremarkable. The patient could have a very early peripheral neuropathy. She will be followed up 6 months. If new symptoms arise, she is to contact us.

## 2014-06-24 ENCOUNTER — Telehealth: Payer: Self-pay

## 2014-06-24 DIAGNOSIS — R921 Mammographic calcification found on diagnostic imaging of breast: Secondary | ICD-10-CM

## 2014-06-24 NOTE — Telephone Encounter (Signed)
No results in Epic. [facility] to have the results faxed over to [place]. Placed report in Dr. Thompson Caul Box. Please advise.

## 2014-06-24 NOTE — Telephone Encounter (Signed)
PT WOULD LIKE DR. Tamala Julian TO GIVE HER A CALL BACK TO TALK ABOUT HER MAMMOGRAM RESULTS.

## 2014-07-06 NOTE — Telephone Encounter (Signed)
Spoke with patient; concerned with waiting six months for repeat imaging.  Interested in MRI breast which is less invasive than biopsy.  Strong family history of cancer.  Concern regarding possible risk of melanoma and breast cancer?  Agreeable to schedule MRI breasts.

## 2014-07-20 ENCOUNTER — Ambulatory Visit (HOSPITAL_COMMUNITY): Payer: 59

## 2014-07-25 ENCOUNTER — Encounter: Payer: Self-pay | Admitting: Internal Medicine

## 2014-07-28 ENCOUNTER — Ambulatory Visit (HOSPITAL_COMMUNITY)
Admission: RE | Admit: 2014-07-28 | Discharge: 2014-07-28 | Disposition: A | Discharge status: Home / Self Care | Payer: 59 | Source: Ambulatory Visit | Attending: Family Medicine | Admitting: Family Medicine

## 2014-07-28 DIAGNOSIS — R928 Other abnormal and inconclusive findings on diagnostic imaging of breast: Secondary | ICD-10-CM | POA: Diagnosis present

## 2014-07-28 DIAGNOSIS — R921 Mammographic calcification found on diagnostic imaging of breast: Secondary | ICD-10-CM

## 2014-07-28 MED ORDER — GADOBENATE DIMEGLUMINE 529 MG/ML IV SOLN
15.0000 mL | Freq: Once | INTRAVENOUS | Status: AC | PRN
  Administered 2014-07-28: 15 mL via INTRAVENOUS

## 2014-09-01 ENCOUNTER — Ambulatory Visit (AMBULATORY_SURGERY_CENTER): Payer: Self-pay | Admitting: *Deleted

## 2014-09-01 VITALS — Ht 65.0 in | Wt 159.2 lb

## 2014-09-01 DIAGNOSIS — Z800 Family history of malignant neoplasm of digestive organs: Secondary | ICD-10-CM

## 2014-09-01 NOTE — Progress Notes (Signed)
No allergies to eggs or soy. No problems with anesthesia.  Pt given Emmi instructions for colonoscopy  No oxygen use  No diet drug use  

## 2014-09-02 ENCOUNTER — Telehealth: Payer: Self-pay | Admitting: *Deleted

## 2014-09-02 NOTE — Telephone Encounter (Signed)
Dr Carlean Purl: Juluis Rainier: Pt is scheduled for direct colonoscopy 09/16/2014 for screening colonoscopy for family hx colon cancer.  She had OV with you 05/05/2013.  Pt was having abnormal gum bleeding and was planning to have hematology workup.  She has been seeing Dr Gaylyn Cheers at [facility].  Under Care Everywhere pt's last OV with Dr. Gaylyn Cheers was 12/03/13 and pt was dx with Platelet storage pool disorder. Pt was here for PV yesterday 10/29 and she said she was cleared for colonoscopy at last visit with Dr. Gaylyn Cheers.  Please let me know if we need any additional follow up before colonoscopy.\ Thanks, [name]

## 2014-09-07 NOTE — Addendum Note (Signed)
Addended by: Steva Ready on: 09/07/2014 09:44 AM   Modules accepted: Level of Service

## 2014-09-07 NOTE — Telephone Encounter (Signed)
She should be ok but should use her DDAVP spray prior to the colonoscopy. Am copying Deanna [name]

## 2014-09-08 ENCOUNTER — Encounter: Payer: Self-pay | Admitting: *Deleted

## 2014-09-08 NOTE — Telephone Encounter (Signed)
Dr.Gessner, I spoke with patient about your recommendations using the DDAVP spray before colonoscopy. She states she will have to be on strict fluid restrictions for 24 hours after using that. She states she can only drink to "quench her thirst" and no caffeine. She then states you may speak with Dr.Ma at [facility] for any questions. Okay for patient to use this before colonoscopy? Thanks, Taylor Levick PV

## 2014-09-12 NOTE — Telephone Encounter (Signed)
Please let the patient know that I spoke to Dr. Lynann Beaver partner and he said safest to take the DDAVP before the colonoscopy in case polyp removal needed. She is then restricted to 1.5 L fluid after to avoid low Na (this includes IVF's).  I know this can be somewhat of a hardship....but it should be doable.  We can doe the colonoscopy without the DDAVP but only look - and not remove polyps.  I will leave it up to her and can call her later today if I have a time frame /contact # that are good

## 2014-09-12 NOTE — Telephone Encounter (Signed)
Left message for pt.to call us back [phone] Deanna [name] PV

## 2014-09-12 NOTE — Telephone Encounter (Signed)
Pt called at [phone] and states she would like to discuss this with you.  She stated she can use stimate  ( s.p.) injections in her abdomen  Instead of the DDAVP spray or she has another option of amicar tablets that works differently than the DDAVP and the stimate  but could be another option.  She will have her cell phone on her and can answer if you call her. Her number is [phone]. She said anytime this afternnon or tomorrow is fine with her.  Thanks, marie PV

## 2014-09-13 NOTE — Telephone Encounter (Signed)
I spoke to the patient and reviewed prior surgical experiences, etc  She will take Amicar and have Stimate/DDAVP available Plan for clips if needed

## 2014-09-16 ENCOUNTER — Ambulatory Visit (AMBULATORY_SURGERY_CENTER): Payer: 59 | Admitting: Internal Medicine

## 2014-09-16 ENCOUNTER — Encounter: Payer: Self-pay | Admitting: Internal Medicine

## 2014-09-16 VITALS — BP 127/76 | HR 58 | Temp 97.7°F | Resp 46 | Ht 65.0 in | Wt 159.0 lb

## 2014-09-16 DIAGNOSIS — D120 Benign neoplasm of cecum: Secondary | ICD-10-CM

## 2014-09-16 DIAGNOSIS — Z800 Family history of malignant neoplasm of digestive organs: Secondary | ICD-10-CM

## 2014-09-16 MED ORDER — SODIUM CHLORIDE 0.9 % IV SOLN: 500.0000 mL | INTRAVENOUS | Status: DC

## 2014-09-16 NOTE — Patient Instructions (Addendum)
I took a 1-2 mm polyp off with forceps. It was not bleeding when I finished. I think you should wait and see - I really doubt you will have any bleeding. Its like a scrape or cut on the skin, I think.  I will let you know pathology results and when to have another routine colonoscopy by mail.  I appreciate the opportunity to care for you. Gatha Mayer, MD, FACG  YOU HAD AN ENDOSCOPIC PROCEDURE TODAY AT [facility]: Refer to the procedure report that was given to you for any specific questions about what was found during the examination.  If the procedure report does not answer your questions, please call your gastroenterologist to clarify.  If you requested that your care partner not be given the details of your procedure findings, then the procedure report has been included in a sealed envelope for you to review at your convenience later.  YOU SHOULD EXPECT: Some feelings of bloating in the abdomen. Passage of more gas than usual.  Walking can help get rid of the air that was put into your GI tract during the procedure and reduce the bloating. If you had a lower endoscopy (such as a colonoscopy or flexible sigmoidoscopy) you may notice spotting of blood in your stool or on the toilet paper. If you underwent a bowel prep for your procedure, then you may not have a normal bowel movement for a few days.  DIET: Your first meal following the procedure should be a light meal and then it is ok to progress to your normal diet.  A half-sandwich or bowl of soup is an example of a good first meal.  Heavy or fried foods are harder to digest and may make you feel nauseous or bloated.  Likewise meals heavy in dairy and vegetables can cause extra gas to form and this can also increase the bloating.  Drink plenty of fluids but you should avoid alcoholic beverages for 24 hours.  ACTIVITY: Your care partner should take you home directly after the procedure.  You should plan to take it easy,  moving slowly for the rest of the day.  You can resume normal activity the day after the procedure however you should NOT DRIVE or use heavy machinery for 24 hours (because of the sedation medicines used during the test).    SYMPTOMS TO REPORT IMMEDIATELY: A gastroenterologist can be reached at any hour.  During normal business hours, 8:30 AM to 5:00 PM Monday through Friday, call [phone].  After hours and on weekends, please call the GI answering service at [phone] who will take a message and have the physician on call contact you.   Following lower endoscopy (colonoscopy or flexible sigmoidoscopy):  Excessive amounts of blood in the stool  Significant tenderness or worsening of abdominal pains  Swelling of the abdomen that is new, acute  Fever of 100F or higher   FOLLOW UP: If any biopsies were taken you will be contacted by phone or by letter within the next 1-3 weeks.  Call your gastroenterologist if you have not heard about the biopsies in 3 weeks.  Our staff will call the home number listed on your records the next business day following your procedure to check on you and address any questions or concerns that you may have at that time regarding the information given to you following your procedure. This is a courtesy call and so if there is no answer at the home number  and we have not heard from you through the emergency physician on call, we will assume that you have returned to your regular daily activities without incident.  SIGNATURES/CONFIDENTIALITY: You and/or your care partner have signed paperwork which will be entered into your electronic medical record.  These signatures attest to the fact that that the information above on your After Visit Summary has been reviewed and is understood.  Full responsibility of the confidentiality of this discharge information lies with you and/or your care-partner.  Polyp information given.

## 2014-09-16 NOTE — Progress Notes (Signed)
Called to room to assist during endoscopic procedure.  Patient ID and intended procedure confirmed with present staff. Received instructions for my participation in the procedure from the performing physician.  

## 2014-09-16 NOTE — Progress Notes (Signed)
Procedure ends, to recovery, report given and VSS. 

## 2014-09-16 NOTE — Op Note (Signed)
[place]  [address] [place], [id number]   COLONOSCOPY PROCEDURE REPORT  PATIENT: Deanna, [name]  MR#: [id number] BIRTHDATE: 07/20/1966 , 48  yrs. old GENDER: female ENDOSCOPIST: Gatha Mayer, MD, [facility] PROCEDURE DATE:  09/16/2014 PROCEDURE:   Colonoscopy with biopsy First Screening Colonoscopy - Avg.  risk and is 50 yrs.  old or older - No.  Prior Negative Screening - Now for repeat screening. N/A  History of Adenoma - Now for follow-up colonoscopy & has been > or = to 3 yrs.  N/A  Polyps Removed Today? Yes. ASA CLASS:   Class II INDICATIONS:patient's immediate family history of colon cancer. MEDICATIONS: Propofol 280 mg IV and Monitored anesthesia care  DESCRIPTION OF PROCEDURE:   After the risks benefits and alternatives of the procedure were thoroughly explained, informed consent was obtained.  The digital rectal exam revealed no abnormalities of the rectum.   The LB [id number]  endoscope was introduced through the anus and advanced to the cecum, which was identified by both the appendix and ileocecal valve. No adverse events experienced.   The quality of the prep was excellent, using MiraLax  The instrument was then slowly withdrawn as the colon was fully examined.      COLON FINDINGS: A sessile polyp ranging from 1 to 32mm in size was found at the ileocecal valve.  A polypectomy was performed with cold forceps.  The resection was complete, the polyp tissue was completely retrieved and sent to histology.   The examination was otherwise normal.   Right colon retroflexion included.  Retroflexed views revealed no abnormalities. The time to cecum=3 minutes 44 seconds.  Withdrawal time=12 minutes 49 seconds.  The scope was withdrawn and the procedure completed. COMPLICATIONS: There were no immediate complications.  ENDOSCOPIC IMPRESSION: 1.   Sessile polyp ranging from 1 to 104mm in size was found at the ileocecal valve; polypectomy was  performed with cold forceps 2.   The examination was otherwise normal - excellent prep  RECOMMENDATIONS: Repeat Colonoscopy in 5 years likely I think she is not likely to bleed from this tiny polypectomy site - will discuss - she has a platelet function defect  eSigned:  Gatha Mayer, MD, [facility] 09/16/2014 2:53 PM   cc: The Patient

## 2014-09-19 ENCOUNTER — Telehealth: Payer: Self-pay | Admitting: *Deleted

## 2014-09-19 NOTE — Telephone Encounter (Signed)
  Follow up Call-  Call back number 09/16/2014  Post procedure Call Back phone  # [phone]  Permission to leave phone message Yes     Patient questions:  Do you have a fever, pain , or abdominal swelling? No. Pain Score  0 *  Have you tolerated food without any problems? Yes.    Have you been able to return to your normal activities? Yes.    Do you have any questions about your discharge instructions: Diet   No. Medications  No. Follow up visit  No.  Do you have questions or concerns about your Care? No.  Actions: * If pain score is 4 or above: No action needed, pain <4.

## 2014-09-21 ENCOUNTER — Encounter: Payer: Self-pay | Admitting: Internal Medicine

## 2014-09-21 DIAGNOSIS — Z86010 Personal history of colonic polyps: Secondary | ICD-10-CM

## 2014-09-21 DIAGNOSIS — Z860101 Personal history of adenomatous and serrated colon polyps: Secondary | ICD-10-CM | POA: Insufficient documentation

## 2014-09-21 HISTORY — DX: Personal history of colonic polyps: Z86.010

## 2014-09-21 HISTORY — DX: Personal history of adenomatous and serrated colon polyps: Z86.0101

## 2014-09-21 NOTE — Progress Notes (Signed)
Quick Note:  Tiny tubular adenoma Repeat colonoscopy 5 years ______

## 2014-11-15 ENCOUNTER — Other Ambulatory Visit: Payer: Self-pay | Admitting: Family Medicine

## 2014-11-15 DIAGNOSIS — R921 Mammographic calcification found on diagnostic imaging of breast: Secondary | ICD-10-CM

## 2014-11-17 ENCOUNTER — Ambulatory Visit: Payer: 59 | Admitting: Neurology

## 2014-11-18 ENCOUNTER — Ambulatory Visit: Payer: 59 | Admitting: Neurology

## 2014-11-24 ENCOUNTER — Ambulatory Visit
Admission: RE | Admit: 2014-11-24 | Discharge: 2014-11-24 | Disposition: A | Discharge status: Home / Self Care | Payer: 59 | Source: Ambulatory Visit | Attending: Family Medicine | Admitting: Family Medicine

## 2014-11-24 ENCOUNTER — Other Ambulatory Visit: Payer: Self-pay | Admitting: Family Medicine

## 2014-11-24 DIAGNOSIS — R921 Mammographic calcification found on diagnostic imaging of breast: Secondary | ICD-10-CM

## 2015-01-09 ENCOUNTER — Ambulatory Visit (INDEPENDENT_AMBULATORY_CARE_PROVIDER_SITE_OTHER): Payer: 59 | Admitting: Neurology

## 2015-01-09 ENCOUNTER — Encounter: Payer: Self-pay | Admitting: Neurology

## 2015-01-09 VITALS — BP 110/72 | HR 66 | Ht 64.0 in | Wt 159.0 lb

## 2015-01-09 DIAGNOSIS — G2581 Restless legs syndrome: Secondary | ICD-10-CM | POA: Diagnosis not present

## 2015-01-09 DIAGNOSIS — R42 Dizziness and giddiness: Secondary | ICD-10-CM | POA: Diagnosis not present

## 2015-01-09 DIAGNOSIS — R202 Paresthesia of skin: Secondary | ICD-10-CM

## 2015-01-09 HISTORY — DX: Dizziness and giddiness: R42

## 2015-01-09 NOTE — Progress Notes (Signed)
Reason for visit: Paresthesias  Deanna [name] is an 49 y.o. female  History of present illness:  Deanna [name] is a 49 year old right-handed white female with a history of foot paresthesias that have been gradually worsening over time. She mainly has issues with paresthesias on the left foot, most intense on the ball the foot. She also has problems on the right foot with some spread into the calf. She may have some icy hot feelings in the hands as well. She denies any overt pain. She does report some slight low back pain without radiation down the legs. Fifteen years ago, the patient reports some problems with vertigo associated with a rocking sensation. The patient underwent ENG evaluation, she was told that one side was abnormal. She does not believe that she underwent MRI evaluation of the brain. The vertigo still persists, particularly when she turns her head a certain way, she can bring on the symptoms. She denies any significant balance issues. She does report restless leg syndrome that is worse when she is inactive in the evening. At night it does not bother her as much when she tries to sleep. She denies any falls. She is not on any medications for the neuropathy. Previously, nerve conduction studies and blood work were done, these studies were unremarkable.  Past Medical History  Diagnosis Date  . Thyroid disease     Post-partum  . Dysplastic nevus     followed yearly by [facility].  . Bleeding gums   . Hx of transfusion of packed red blood cells   . Bleeding disorder 08/31/2013  . Clotting disorder   . Restless legs syndrome (RLS) 05/18/2014  . Hx of adenomatous polyp of colon 09/21/2014  . Vertigo, intermittent 01/09/2015    Past Surgical History  Procedure Laterality Date  . Partial hysterectomy  11/04/2004    DUB/adenomyosis. Ovaries intact.  Deanna [name].  Marland Kitchen Resection dysplastic nevus  2012  . Nasal fracture surgery  1995    post-op hemorrhage  . Breast surgery  Left 2007    removal of a severely dysplastic nevus vs. melanoma    Family History  Problem Relation Age of Onset  . Colon cancer Mother 64  . Diabetes Mother   . Hypertension Mother   . Cancer Mother 24    colon cancer  . Dementia Mother   . Stroke Father 23  . Hypertension Brother   . Heart disease Brother   . Thyroid disease Sister   . Colon cancer Sister 83  . Cancer Sister 64    colon cancer  . COPD Sister   . Heart disease Sister 50    AMI  . Cancer Brother 68    soft tissue cancer in throat/neck  . Cancer Brother 37    bone cancer  . Heart disease Brother     multiple AMI    Social history:  reports that she has never smoked. She has never used smokeless tobacco. She reports that she does not drink alcohol or use illicit drugs.   No Known Allergies  Medications:  Prior to Admission medications   Medication Sig Start Date End Date Taking? Authorizing Provider  cholecalciferol (VITAMIN D) 1000 UNITS tablet Take 4,000 Units by mouth daily.    Yes Historical Provider, MD  desmopressin (STIMATE) 1.5 MG/ML SOLN Place 1.5 sprays into the nose as needed. 12/06/13  Yes Historical Provider, MD  Multiple Vitamin (MULTIVITAMIN) capsule Take 1 capsule by mouth daily.   Yes Historical Provider, MD  ROS:  Out of a complete 14 system review of symptoms, the patient complains only of the following symptoms, and all other reviewed systems are negative.  Blurred vision Restless legs, daytime sleepiness Bruising easily, numbness  Blood pressure 110/72, pulse 66, height 5\' 4"  (1.626 m), weight 159 lb (72.122 kg).  Physical Exam  General: The patient is alert and cooperative at the time of the examination.  Skin: No significant peripheral edema is noted.   Neurologic Exam  Mental status: The patient is oriented x 3.  Cranial nerves: Facial symmetry is present. Speech is normal, no aphasia or dysarthria is noted. Extraocular movements are full. Visual fields are  full.  Motor: The patient has good strength in all 4 extremities.  Sensory examination: Soft touch sensation is symmetric on the arms and face, the patient has a stocking pattern pinprick sensory deficit two thirds the way up the right leg, across the ankle on the left.  Coordination: The patient has good finger-nose-finger and heel-to-shin bilaterally.  Gait and station: The patient has a normal gait. Tandem gait is normal. Romberg is negative. No drift is seen.  Reflexes: Deep tendon reflexes are symmetric.   Assessment/Plan:  1. Peripheral neuropathy  2. Restless leg syndrome  3. History of vertigo  The patient reports symptoms consistent with neuropathy, but now there is some asymmetry, worse on the right than the left. The patient does have some low back pain, the possibility of lumbosacral spinal stenosis needs to be considered. Given the history of some sensory alterations on the hands and history of vertigo, other causes of her symptoms may need to be considered. If the symptoms persist, MRI evaluation of the brain may be done to exclude demyelinating disease. The patient does not wish to go on medications for the restless leg syndrome or for the neuropathy at this point. She will follow-up in 6 months.  Jill Alexanders MD 01/09/2015 9:43 PM  [facility] [address] [place], [place] [address]  Phone [phone] Fax [phone]

## 2015-01-09 NOTE — Patient Instructions (Signed)
Restless Legs Syndrome Restless legs syndrome is a movement disorder. It may also be called a sensorimotor disorder.  CAUSES  No one knows what specifically causes restless legs syndrome, but it tends to run in families. It is also more common in people with low iron, in pregnancy, in people who need dialysis, and those with nerve damage (neuropathy).Some medications may make restless legs syndrome worse.Those medications include drugs to treat high blood pressure, some heart conditions, nausea, colds, allergies, and depression. SYMPTOMS Symptoms include uncomfortable sensations in the legs. These leg sensations are worse during periods of inactivity or rest. They are also worse while sitting or lying down. Individuals that have the disorder describe sensations in the legs that feel like:  Pulling.  Drawing.  Crawling.  Worming.  Boring.  Tingling.  Pins and needles.  Prickling.  Pain. The sensations are usually accompanied by an overwhelming urge to move the legs. Sudden muscle jerks may also occur. Movement provides temporary relief from the discomfort. In rare cases, the arms may also be affected. Symptoms may interfere with going to sleep (sleep onset insomnia). Restless legs syndrome may also be related to periodic limb movement disorder (PLMD). PLMD is another more common motor disorder. It also causes interrupted sleep. The symptoms from PLMD usually occur most often when you are awake. TREATMENT  Treatment for restless legs syndrome is symptomatic. This means that the symptoms are treated.   Massage and cold compresses may provide temporary relief.  Walk, stretch, or take a cold or hot bath.  Get regular exercise and a good night's sleep.  Avoid caffeine, alcohol, nicotine, and medications that can make it worse.  Do activities that provide mental stimulation like discussions, needlework, and video games. These may be helpful if you are not able to walk or stretch. Some  medications are effective in relieving the symptoms. However, many of these medications have side effects. Ask your caregiver about medications that may help your symptoms. Correcting iron deficiency may improve symptoms for some patients. Document Released: 10/11/2002 Document Revised: 03/07/2014 Document Reviewed: 01/17/2011 ExitCare Patient Information 2015 [employer]. This information is not intended to replace advice given to you by your health care provider. Make sure you discuss any questions you have with your health care provider.  

## 2015-02-01 ENCOUNTER — Encounter: Payer: Self-pay | Admitting: Neurology

## 2015-02-23 ENCOUNTER — Telehealth: Payer: Self-pay | Admitting: Family Medicine

## 2015-02-23 NOTE — Telephone Encounter (Signed)
lmom to reschedule appt Dr Tamala Julian will not be in the office on 05/17/15

## 2015-04-12 ENCOUNTER — Telehealth: Payer: Self-pay | Admitting: Neurology

## 2015-04-12 DIAGNOSIS — R1314 Dysphagia, pharyngoesophageal phase: Secondary | ICD-10-CM

## 2015-04-12 NOTE — Telephone Encounter (Signed)
Patient called stating she has new symptoms related to the neuropathy which are back of tongue and throat.

## 2015-04-12 NOTE — Telephone Encounter (Signed)
I called the patient. The patient has had several weeks of dysphagia mainly with liquids, some numbness in the base of her tongue. She also has some numbness in the feet. Given the symptoms, I will set her up for MRI evaluation the brain to exclude demyelinating disease.

## 2015-04-12 NOTE — Telephone Encounter (Signed)
The patient called back. She stated that she has been having trouble swallowing liquids (not so much solids). She describes her throat and the back of her tongue as feeling numb. This has been going on for a few weeks. Otherwise, her neuropathy is about the same. She also stated that she has been having more frequent headaches. She had one about a week and a half ago that last 4 days.

## 2015-04-12 NOTE — Telephone Encounter (Signed)
I tried to call the patient. Left a voicemail.

## 2015-04-12 NOTE — Telephone Encounter (Signed)
Patient is calling stating she has new symptoms and she states Dr Jannifer Franklin advised her to call back and talk with him if new symptoms occurred. She is having numbess of throat and the very back of the tongue. She is frequently getting strangled with liquids, has not with solids. Please call and advise. Patient can be reached [phone]  After 12:30.

## 2015-04-18 ENCOUNTER — Telehealth: Payer: Self-pay | Admitting: Internal Medicine

## 2015-04-18 NOTE — Telephone Encounter (Signed)
Called to let me know she began to have rectal bleeding today. Small drips of bright red blood noticed with urination. No pain. No hx of this and no known hemorrhoids. Small volume a few times tonight.  She has a qualitative platelet defect.  Advised to monitor bleeding and if worsens can take medication to improve PLT fx and/or call back.  If severe to go to ED  We will contact her in Am re: overnight sxs and see about arranging some f/u

## 2015-04-19 ENCOUNTER — Ambulatory Visit (INDEPENDENT_AMBULATORY_CARE_PROVIDER_SITE_OTHER): Payer: 59

## 2015-04-19 DIAGNOSIS — R1314 Dysphagia, pharyngoesophageal phase: Secondary | ICD-10-CM

## 2015-04-19 NOTE — Telephone Encounter (Signed)
Left message for patient to call back  

## 2015-04-21 ENCOUNTER — Telehealth: Payer: Self-pay | Admitting: Neurology

## 2015-04-21 DIAGNOSIS — M545 Low back pain, unspecified: Secondary | ICD-10-CM

## 2015-04-21 DIAGNOSIS — R202 Paresthesia of skin: Secondary | ICD-10-CM

## 2015-04-21 NOTE — Telephone Encounter (Signed)
I spoke with the patient and she reports that bleeding had stopped by the next morning.  She has had no further bleeding.  Dr. Carlean Purl does she need any additional follow up ?

## 2015-04-21 NOTE — Telephone Encounter (Signed)
I called patient. The MRI the brain shows a single left parietal white matter lesion, nonspecific. No clear evidence of demyelinating disease. The patient is having some low back pain and paresthesias in the legs, she continues to have some swallowing issues and slight dizziness. I will check MRI evaluation the low back at this point. Nerve conduction studies done previously were unremarkable. A small fiber neuropathy cannot be excluded.   MRI brain 04/19/15:  IMPRESSION:  Equivocal MRI brain (without) demonstrating: 1. Single non-specific left parietal, subcortical, T2 hyperintensity noted (12mm). Considerations include autoimmune, inflammatory, post-infectious, microvascular ischemic or migraine associated etiologies.  2. No acute findings.

## 2015-04-24 NOTE — Telephone Encounter (Signed)
Prn is ok

## 2015-05-10 ENCOUNTER — Ambulatory Visit (INDEPENDENT_AMBULATORY_CARE_PROVIDER_SITE_OTHER): Payer: 59

## 2015-05-10 DIAGNOSIS — M545 Low back pain, unspecified: Secondary | ICD-10-CM

## 2015-05-10 DIAGNOSIS — R202 Paresthesia of skin: Secondary | ICD-10-CM

## 2015-05-14 ENCOUNTER — Telehealth: Payer: Self-pay | Admitting: Neurology

## 2015-05-14 DIAGNOSIS — M48062 Spinal stenosis, lumbar region with neurogenic claudication: Secondary | ICD-10-CM

## 2015-05-14 NOTE — Telephone Encounter (Signed)
I called the patient. The MRI does show some L4-5 spinal stenosis. ? If this is the cause of the leg and foot discomfort. We will try an epidural steroid injection.   MRI lumbar spine 05/11/15:  IMPRESSION:  Abnormal MRI lumbar spine (without) demonstrating: 1. At L4-5: disc bulging and facet / ligamentum flavum hypertrophy with moderate-severe spinal stenosis and mild biforaminal stenosis  2. At L5-S1: disc bulging and facet hypertrophy with moderate right and mild-moderate left foraminal stenosis

## 2015-05-16 ENCOUNTER — Other Ambulatory Visit: Payer: Self-pay | Admitting: Family Medicine

## 2015-05-16 DIAGNOSIS — R921 Mammographic calcification found on diagnostic imaging of breast: Secondary | ICD-10-CM

## 2015-05-17 ENCOUNTER — Encounter: Payer: 59 | Admitting: Family Medicine

## 2015-05-18 ENCOUNTER — Other Ambulatory Visit: Payer: Self-pay | Admitting: Neurology

## 2015-05-18 DIAGNOSIS — R202 Paresthesia of skin: Secondary | ICD-10-CM

## 2015-05-18 DIAGNOSIS — G8929 Other chronic pain: Secondary | ICD-10-CM

## 2015-05-18 DIAGNOSIS — M545 Low back pain: Principal | ICD-10-CM

## 2015-05-25 ENCOUNTER — Ambulatory Visit
Admission: RE | Admit: 2015-05-25 | Discharge: 2015-05-25 | Disposition: A | Discharge status: Home / Self Care | Payer: 59 | Source: Ambulatory Visit | Attending: Family Medicine | Admitting: Family Medicine

## 2015-05-25 DIAGNOSIS — R921 Mammographic calcification found on diagnostic imaging of breast: Secondary | ICD-10-CM

## 2015-06-26 ENCOUNTER — Ambulatory Visit (INDEPENDENT_AMBULATORY_CARE_PROVIDER_SITE_OTHER): Payer: 59 | Admitting: Family Medicine

## 2015-06-26 ENCOUNTER — Encounter: Payer: Self-pay | Admitting: Family Medicine

## 2015-06-26 VITALS — BP 112/73 | HR 65 | Temp 98.3°F | Resp 16 | Ht 65.0 in | Wt 168.0 lb

## 2015-06-26 DIAGNOSIS — Z130 Encounter for screening for diseases of the blood and blood-forming organs and certain disorders involving the immune mechanism: Secondary | ICD-10-CM | POA: Diagnosis not present

## 2015-06-26 DIAGNOSIS — Z13220 Encounter for screening for lipoid disorders: Secondary | ICD-10-CM

## 2015-06-26 DIAGNOSIS — Z0000 Encounter for general adult medical examination without abnormal findings: Secondary | ICD-10-CM

## 2015-06-26 DIAGNOSIS — Z131 Encounter for screening for diabetes mellitus: Secondary | ICD-10-CM

## 2015-06-26 DIAGNOSIS — F4321 Adjustment disorder with depressed mood: Secondary | ICD-10-CM | POA: Diagnosis not present

## 2015-06-26 DIAGNOSIS — Z8639 Personal history of other endocrine, nutritional and metabolic disease: Secondary | ICD-10-CM | POA: Diagnosis not present

## 2015-06-26 LAB — CBC
HEMATOCRIT: 38.4 % (ref 36.0–46.0)
Hemoglobin: 13.1 g/dL (ref 12.0–15.0)
MCH: 30.5 pg (ref 26.0–34.0)
MCHC: 34.1 g/dL (ref 30.0–36.0)
MCV: 89.3 fL (ref 78.0–100.0)
MPV: 10 fL (ref 8.6–12.4)
Platelets: 296 10*3/uL (ref 150–400)
RBC: 4.3 MIL/uL (ref 3.87–5.11)
RDW: 13.2 % (ref 11.5–15.5)
WBC: 6.9 10*3/uL (ref 4.0–10.5)

## 2015-06-26 LAB — COMPREHENSIVE METABOLIC PANEL
ALBUMIN: 4.4 g/dL (ref 3.6–5.1)
ALK PHOS: 57 U/L (ref 33–115)
ALT: 17 U/L (ref 6–29)
AST: 17 U/L (ref 10–35)
BUN: 9 mg/dL (ref 7–25)
CALCIUM: 9 mg/dL (ref 8.6–10.2)
CO2: 28 mmol/L (ref 20–31)
Chloride: 103 mmol/L (ref 98–110)
Creat: 0.68 mg/dL (ref 0.50–1.10)
Glucose, Bld: 74 mg/dL (ref 65–99)
Potassium: 3.7 mmol/L (ref 3.5–5.3)
Sodium: 142 mmol/L (ref 135–146)
Total Bilirubin: 0.6 mg/dL (ref 0.2–1.2)
Total Protein: 7.7 g/dL (ref 6.1–8.1)

## 2015-06-26 LAB — LIPID PANEL
Cholesterol: 212 mg/dL — ABNORMAL HIGH (ref 125–200)
HDL: 85 mg/dL (ref >=46)
LDL Cholesterol: 114 mg/dL (ref <130)
Total CHOL/HDL Ratio: 2.5 Ratio (ref <=5.0)
Triglycerides: 66 mg/dL (ref <150)
VLDL: 13 mg/dL (ref <30)

## 2015-06-26 LAB — TSH: TSH: 2.287 u[IU]/mL (ref 0.350–4.500)

## 2015-06-26 MED ORDER — ESCITALOPRAM OXALATE 10 MG PO TABS: 10.0000 mg | ORAL_TABLET | Freq: Every day | ORAL | Status: DC

## 2015-06-26 NOTE — Patient Instructions (Addendum)
Great to see you today- I will be in touch with your labs over mychart.   Let's have you start back on lexapro 10 mg- take it once a day and let's see how you do with this.   Please give me an update in the next few weeks! We can increase your lexapro if we need to Please try to increase the amount you are sleeping- you might try some melatonin at bedtime

## 2015-06-26 NOTE — Progress Notes (Signed)
[facility] [address] [place] [address] [phone]  Date:  06/26/2015   Name:  Deanna [name]   DOB:  08/07/1966   MRN:  [id number]  PCP:  Reginia Forts, MD    Chief Complaint: Annual Exam and Depression   History of Present Illness:  Deanna [name] is a 49 y.o. very pleasant female patient who presents with the following:  Here today for a CPE. She is fasting for labs S/p hysterectomy- this was for benign causes, due to DUB.   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   She has not had any bleeding problems as of late.   She uses DDAVP nasal spray if needed for bleeding.  This is followed at [facility]- they do some annual testing for her.    Colonoscpy, mammogram are UTD, as is her tetanus shot.    She is going through a difficult divorces, a "drawn out process," this has caused some depression and a lot of stress.  She and her children were caught off guard, she has and older teen and a 64- year old.  They are both away at school- [place].  They are doing well but are currently very angry with their dad.  She is seeing a counselor every couple of weeks- this does seem to help her.  She is sleeping 4-5 hours a night, stays up late at night trying to get things done.  She has a lot to manage now as a single mother.  She is eating ok.   She has gained several pounds over the last month or so and notes that she feels cold a lot- would like to check her TSH today.   She did take medication for depression in the past when her sister died.  She was able to stop this eventually.  She took lexapro and did well with it.  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    Wt Readings from Last 3 Encounters:  06/26/15 168 lb (76.204 kg)  05/09/15 159 lb  (72.122 kg)  04/18/15 159 lb (72.122 kg)        Patient Active Problem List   Diagnosis Date Noted  . Vertigo, intermittent 01/09/2015  . Hx of adenomatous polyp of colon 09/21/2014  . Restless legs syndrome (RLS) 05/18/2014  . Platelet dysfunction 05/16/2014  . Screening for breast cancer 05/11/2014  . Bleeding disorder 08/31/2013  . Occipital neuralgia 07/19/2013  . Paresthesias 07/19/2013  . Hematuria 07/19/2013  . Family history of colon cancer requiring screening colonoscopy 04/12/2013  . Bleeding gums 04/12/2013  . Breast cancer screening 04/12/2013  . Other specified periodontal diseases 04/12/2013  . Other malaise and fatigue 04/12/2013  . Anemia, unspecified 04/12/2013  . Unspecified vitamin D deficiency 04/12/2013  . Palpitations 03/09/2013  . Bleeding 03/09/2013  . Thyroid disease   . Dysplastic nevus     Past Medical History  Diagnosis Date  . Thyroid disease     Post-partum  . Dysplastic nevus     followed yearly by [facility].  . Bleeding gums   . Hx of transfusion of packed red blood cells   . Bleeding disorder 08/31/2013  . Clotting disorder   . Restless legs syndrome (RLS) 05/18/2014  .  Hx of adenomatous polyp of colon 09/21/2014  . Vertigo, intermittent 01/09/2015    Past Surgical History  Procedure Laterality Date  . Partial hysterectomy  11/04/2004    DUB/adenomyosis. Ovaries intact.  [name]  Marland Kitchen Resection dysplastic nevus  2012  . Nasal fracture surgery  1995    post-op hemorrhage  . Breast surgery Left 2007    removal of a severely dysplastic nevus vs. melanoma    Social History  Substance Use Topics  . Smoking status: Never Smoker   . Smokeless tobacco: Never Used  . Alcohol Use: No    Family History  Problem Relation Age of Onset  . Colon cancer Mother 69  . Diabetes Mother   . Hypertension Mother   . Cancer Mother 31    colon cancer  . Dementia Mother   . Stroke Father 39  . Hypertension Brother   . Heart disease  Brother   . Thyroid disease Sister   . Colon cancer Sister 36  . Cancer Sister 46    colon cancer  . COPD Sister   . Heart disease Sister 55    AMI  . Cancer Brother 68    soft tissue cancer in throat/neck  . Cancer Brother 33    bone cancer  . Heart disease Brother     multiple AMI    No Known Allergies  Medication list has been reviewed and updated.  Current Outpatient Prescriptions on File Prior to Visit  Medication Sig Dispense Refill  . cholecalciferol (VITAMIN D) 1000 UNITS tablet Take 4,000 Units by mouth daily.     Marland Kitchen desmopressin (STIMATE) 1.5 MG/ML SOLN Place 1.5 sprays into the nose as needed.    . Multiple Vitamin (MULTIVITAMIN) capsule Take 1 capsule by mouth daily.     No current facility-administered medications on file prior to visit.    Review of Systems:  As per HPI- otherwise negative.   Physical Examination: Filed Vitals:   06/26/15 0822  BP: 112/73  Pulse: 65  Temp: 98.3 F (36.8 C)  Resp: 16   Filed Vitals:   06/26/15 0822  Height: 5\' 5"  (1.651 m)  Weight: 168 lb (76.204 kg)   Body mass index is 27.96 kg/(m^2). Ideal Body Weight: Weight in (lb) to have BMI = 25: 149.9  GEN: WDWN, NAD, Non-toxic, A & O x 3, looks well, overweight HEENT: Atraumatic, Normocephalic. Neck supple. No masses, No LAD.  Bilateral TM wnl, oropharynx normal.  PEERL,EOMI.   Ears and Nose: No external deformity. CV: RRR, No M/G/R. No JVD. No thrill. No extra heart sounds. PULM: CTA B, no wheezes, crackles, rhonchi. No retractions. No resp. distress. No accessory muscle use. ABD: S, NT, ND. No rebound. No HSM. EXTR: No c/c/e NEURO Normal gait.  PSYCH: Normally interactive. Conversant. Not depressed or anxious appearing.  Calm demeanor.    Assessment and Plan: Physical exam  Screening for deficiency anemia - Plan: CBC  Screening for diabetes mellitus - Plan: Comprehensive metabolic panel  Screening for hyperlipidemia - Plan: Lipid panel  History of thyroid  disease - Plan: TSH  Adjustment disorder with depressed mood - Plan: escitalopram (LEXAPRO) 10 MG tablet  Await labs as above, check her TSH as she has noted weight gain and cold intolerance.  Start on lexapro for her sx of anxiety and depression- she will keep me posted about her progress Encouraged exercise and getting more sleep  Signed Lamar Blinks, MD

## 2015-07-12 ENCOUNTER — Ambulatory Visit: Payer: 59 | Admitting: Adult Health

## 2015-08-16 ENCOUNTER — Telehealth: Payer: Self-pay | Admitting: Neurology

## 2015-08-16 NOTE — Telephone Encounter (Signed)
Pt called to r/s appt. In talking with her she is wanting to move forward with epidural and is inquiring if that could scheduled without having to come in to see Dr Jannifer Franklin. Please call and advise. Patient can be reached at [phone].

## 2015-08-16 NOTE — Telephone Encounter (Signed)
I called the patient. She stated she never heard from anyone at [facility] and when she called someone told her that the order had expired. I called and spoke to [place]. She stated the order was not expired and that she left 2 voicemails for the patient. She is going to try to reach her again. I called the patient back and left a voicemail advising she call me tomorrow afternoon if she still hasn't heard anything. We will wait until the epidural is scheduled to r/s the patient's f/u with Dr. Jannifer Franklin.

## 2015-08-17 NOTE — Telephone Encounter (Signed)
I called the patient and left a voicemail asking her to call back to schedule f/u with Dr. Jannifer Franklin. Epidural is scheduled for 10/21. We will schedule f/u after that.

## 2015-08-21 ENCOUNTER — Ambulatory Visit: Payer: 59 | Admitting: Neurology

## 2015-08-23 NOTE — Telephone Encounter (Signed)
I called the patient to schedule f/u appointment. Voicemail box is full. Unable to leave message.

## 2015-08-25 ENCOUNTER — Ambulatory Visit
Admission: RE | Admit: 2015-08-25 | Discharge: 2015-08-25 | Disposition: A | Discharge status: Home / Self Care | Payer: 59 | Source: Ambulatory Visit | Attending: Neurology | Admitting: Neurology

## 2015-08-25 DIAGNOSIS — R202 Paresthesia of skin: Secondary | ICD-10-CM

## 2015-08-25 DIAGNOSIS — M545 Low back pain: Principal | ICD-10-CM

## 2015-08-25 DIAGNOSIS — G8929 Other chronic pain: Secondary | ICD-10-CM

## 2015-08-25 MED ORDER — METHYLPREDNISOLONE ACETATE 40 MG/ML INJ SUSP (RADIOLOG
120.0000 mg | Freq: Once | INTRAMUSCULAR | Status: AC
  Administered 2015-08-25: 120 mg via EPIDURAL

## 2015-08-25 MED ORDER — IOHEXOL 180 MG/ML  SOLN
1.0000 mL | Freq: Once | INTRAMUSCULAR | Status: DC | PRN
  Administered 2015-08-25: 1 mL via EPIDURAL

## 2015-08-25 NOTE — Discharge Instructions (Signed)

## 2015-09-04 NOTE — Telephone Encounter (Signed)
I called the patient and left a voicemail asking her to call back to schedule f/u appointment. I have also mailed a letter to the patient.

## 2015-09-22 ENCOUNTER — Encounter: Payer: Self-pay | Admitting: Neurology

## 2015-09-22 ENCOUNTER — Ambulatory Visit (INDEPENDENT_AMBULATORY_CARE_PROVIDER_SITE_OTHER): Payer: 59 | Admitting: Neurology

## 2015-09-22 VITALS — BP 117/70 | HR 76 | Ht 65.0 in | Wt 169.5 lb

## 2015-09-22 DIAGNOSIS — R202 Paresthesia of skin: Secondary | ICD-10-CM | POA: Diagnosis not present

## 2015-09-22 DIAGNOSIS — G2581 Restless legs syndrome: Secondary | ICD-10-CM | POA: Diagnosis not present

## 2015-09-22 DIAGNOSIS — M48061 Spinal stenosis, lumbar region without neurogenic claudication: Secondary | ICD-10-CM

## 2015-09-22 DIAGNOSIS — M4806 Spinal stenosis, lumbar region: Secondary | ICD-10-CM | POA: Diagnosis not present

## 2015-09-22 HISTORY — DX: Spinal stenosis, lumbar region without neurogenic claudication: M48.061

## 2015-09-22 NOTE — Patient Instructions (Signed)
   We will set up another epidural injection, let me know if medical therapy is needed.   Spinal Stenosis Spinal stenosis is an abnormal narrowing of the canals of your spine (vertebrae). CAUSES  Spinal stenosis is caused by areas of bone pushing into the central canals of your vertebrae. This condition can be present at birth (congenital). It also may be caused by arthritic deterioration of your vertebrae (spinal degeneration).  SYMPTOMS   Pain that is generally worse with activities, particularly standing and walking.  Numbness, tingling, hot or cold sensations, weakness, or weariness in your legs.  Frequent episodes of falling.  A foot-slapping gait that leads to muscle weakness. DIAGNOSIS  Spinal stenosis is diagnosed with the use of magnetic resonance imaging (MRI) or computed tomography (CT). TREATMENT  Initial therapy for spinal stenosis focuses on the management of the pain and other symptoms associated with the condition. These therapies include:  Practicing postural changes to lessen pressure on your nerves.  Exercises to strengthen the core of your body.  Loss of excess body weight.  The use of nonsteroidal anti-inflammatory medicines to reduce swelling and inflammation in your nerves. When therapies to manage pain are not successful, surgery to treat spinal stenosis may be recommended. This surgery involves removing excess bone, which puts pressure on your nerve roots. During this surgery (laminectomy), the posterior boney arch (lamina) and excess bone around the facet joints are removed.   This information is not intended to replace advice given to you by your health care provider. Make sure you discuss any questions you have with your health care provider.   Document Released: 01/11/2004 Document Revised: 11/11/2014 Document Reviewed: 01/29/2013 Elsevier Interactive Patient Education [employer].

## 2015-09-22 NOTE — Progress Notes (Signed)
3   Reason for visit:  Lumbosacral spinal stenosis  Chenell RAKEB BUCKNAM is an 49 y.o. female  History of present illness:   Ms. Deanna [name] is a 49 year old right-handed white female with a history of low back pain, some discomfort down in the legs below the knees. The patient indicates that she gets burning and tingling sensations in her feet, this is present at all times, but the symptoms are better at nighttime. The patient will have some numbness in the feet, but if she walks longer distances, the symptoms will significantly increase. The patient usually walks about a mile half daily. This will increase the discomfort in her feet while she is walking, she feels better when she is resting. She has had nerve conduction studies previously that were unremarkable. The patient underwent MRI of the lumbar spine that has shown evidence of a moderate to severe lumbosacral spinal stenosis at the L4-5 level. An epidural steroid injection was done on 08/25/2015, the patient gained minimal benefit from the injection. The patient is not on any oral medications for neuropathic pain. She returns to this office for an evaluation.  Past Medical History  Diagnosis Date  . Thyroid disease     Post-partum  . Dysplastic nevus     followed yearly by [facility].  . Bleeding gums   . Hx of transfusion of packed red blood cells   . Bleeding disorder ([place]) 08/31/2013  . Clotting disorder ([place])   . Restless legs syndrome (RLS) 05/18/2014  . Hx of adenomatous polyp of colon 09/21/2014  . Vertigo, intermittent 01/09/2015  . Spinal stenosis of lumbar region 09/22/2015    Past Surgical History  Procedure Laterality Date  . Partial hysterectomy  11/04/2004    DUB/adenomyosis. Ovaries intact.  [name]  Marland Kitchen Resection dysplastic nevus  2012  . Nasal fracture surgery  1995    post-op hemorrhage  . Breast surgery Left 2007    removal of a severely dysplastic nevus vs. melanoma    Family History  Problem Relation  Age of Onset  . Colon cancer Mother 35  . Diabetes Mother   . Hypertension Mother   . Cancer Mother 60    colon cancer  . Dementia Mother   . Stroke Father 34  . Hypertension Brother   . Heart disease Brother   . Thyroid disease Sister   . Colon cancer Sister 41  . Cancer Sister 73    colon cancer  . COPD Sister   . Heart disease Sister 32    AMI  . Cancer Brother 68    soft tissue cancer in throat/neck  . Cancer Brother 36    bone cancer  . Heart disease Brother     multiple AMI    Social history:  reports that she has never smoked. She has never used smokeless tobacco. She reports that she does not drink alcohol or use illicit drugs.   No Known Allergies  Medications:  Prior to Admission medications   Medication Sig Start Date End Date Taking? Authorizing Provider  cholecalciferol (VITAMIN D) 1000 UNITS tablet Take 4,000 Units by mouth daily.    Yes Historical Provider, MD  desmopressin (STIMATE) 1.5 MG/ML SOLN Place 1.5 sprays into the nose as needed. 12/06/13  Yes Historical Provider, MD  escitalopram (LEXAPRO) 10 MG tablet Take 1 tablet (10 mg total) by mouth daily. 06/26/15  Yes Gay Filler Copland, MD  Multiple Vitamin (MULTIVITAMIN) capsule Take 1 capsule by mouth daily.   Yes Historical Provider,  MD    ROS:  Out of a complete 14 system review of symptoms, the patient complains only of the following symptoms, and all other reviewed systems are negative.   Paresthesias of the feet  Back pain  Blood pressure 117/70, pulse 76, height 5\' 5"  (1.651 m), weight 169 lb 8 oz (76.885 kg).  Physical Exam  General: The patient is alert and cooperative at the time of the examination.   Neuromuscular: range of movement of the low back is full.  Skin: No significant peripheral edema is noted.   Neurologic Exam  Mental status: The patient is alert and oriented x 3 at the time of the examination. The patient has apparent normal recent and remote memory, with an apparently  normal attention span and concentration ability.   Cranial nerves: Facial symmetry is present. Speech is normal, no aphasia or dysarthria is noted. Extraocular movements are full. Visual fields are full.  Motor: The patient has good strength in all 4 extremities. The patient is able to walk on heels and the toes bilaterally.  Sensory examination: Soft touch sensation is symmetric on the face, arms, and legs.  Coordination: The patient has good finger-nose-finger and heel-to-shin bilaterally.  Gait and station: The patient has a normal gait. Tandem gait is normal. Romberg is negative. No drift is seen.  Reflexes: Deep tendon reflexes are symmetric.   Assessment/Plan:   1. Lumbosacral spinal stenosis, L4-5 level   The patient is having ongoing symptoms, the discomfort in the feet worsens with walking , it is possible that it may be related to the lumbosacral spinal stenosis. The patient be sent back for a repeat epidural steroid injection. If this is not effective, we will not continue these treatments. The patient may decide to go on medication such as gabapentin or Cymbalta in the future. She will otherwise follow-up in 6 months.  Jill Alexanders MD 09/22/2015 8:01 PM  [facility] [address] [place], [place] [address]  Phone [phone] Fax [phone]

## 2015-09-26 ENCOUNTER — Other Ambulatory Visit: Payer: Self-pay | Admitting: Neurology

## 2015-09-26 DIAGNOSIS — M48061 Spinal stenosis, lumbar region without neurogenic claudication: Secondary | ICD-10-CM

## 2015-11-01 ENCOUNTER — Ambulatory Visit (INDEPENDENT_AMBULATORY_CARE_PROVIDER_SITE_OTHER): Payer: 59

## 2015-11-01 ENCOUNTER — Ambulatory Visit (INDEPENDENT_AMBULATORY_CARE_PROVIDER_SITE_OTHER): Payer: 59 | Admitting: Family Medicine

## 2015-11-01 VITALS — BP 124/76 | HR 69 | Temp 98.8°F | Resp 16 | Ht 64.5 in | Wt 169.4 lb

## 2015-11-01 DIAGNOSIS — R0789 Other chest pain: Secondary | ICD-10-CM

## 2015-11-01 DIAGNOSIS — R079 Chest pain, unspecified: Secondary | ICD-10-CM

## 2015-11-01 DIAGNOSIS — D699 Hemorrhagic condition, unspecified: Secondary | ICD-10-CM

## 2015-11-01 DIAGNOSIS — D689 Coagulation defect, unspecified: Secondary | ICD-10-CM | POA: Diagnosis not present

## 2015-11-01 LAB — TROPONIN I: Troponin I: <0.01 ng/mL (ref <0.06)

## 2015-11-01 LAB — POCT CBC
Granulocyte percent: 69.1 % (ref 37–80)
HCT, POC: 38.8 % (ref 37.7–47.9)
Hemoglobin: 13.5 g/dL (ref 12.2–16.2)
Lymph, poc: 1.7 (ref 0.6–3.4)
MCH, POC: 30.9 pg (ref 27–31.2)
MCHC: 34.8 g/dL (ref 31.8–35.4)
MCV: 88.9 fL (ref 80–97)
MID (cbc): 0.4 (ref 0–0.9)
MPV: 7.3 fL (ref 0–99.8)
POC Granulocyte: 4.6 (ref 2–6.9)
POC LYMPH PERCENT: 24.8 %L (ref 10–50)
POC MID %: 6.1 % (ref 0–12)
Platelet Count, POC: 230 10*3/uL (ref 142–424)
RBC: 4.37 M/uL (ref 4.04–5.48)
RDW, POC: 13.4 %
WBC: 6.7 10*3/uL (ref 4.6–10.2)

## 2015-11-01 NOTE — Progress Notes (Signed)
Chief Complaint:  Chief Complaint  Patient presents with  . Chest Pain    Started Friday    HPI: Deanna [name] is a 49 y.o. female who reports to [facility] today complaining of intermittent midsternal CP for last 3 days , the last 36-46 hrs has been more constant. Has had dull achey pressure , then intermitenlty sharp pain theat radiates to her back. She has not traveled any where where theire is long car rides and plane rides. Denies any slurred speech, she has had  dull HA but does have hx of migraines. She has remote reflux, she took tums and does not seem to help. She has no significant family hx or personla hx of CAD who presents with a 3 day hx of midsubsternal CP that was intermittent at rest/exertion which in the last 48 hours has progressed to be more constant with radiation to her left chest and back. She denies any palpitations, diaphoresis, abd pain, n/v, numbness weakness or tingling. She cannot take ASA due to hx of increase bleeding time, she does have a hx of GERD but does not feel like it. She is stressed. She is in the middle of a divorce. She has seen cardilogy and had echo done for plapitations and also CP in 04/2013 with nl echocradiogram in 03/2013. Dr Stanford Breed is her cardiologist, was rx metoprolol prn for palpitations  Based on recent labs: No high cholesterol  No diabetes No active thryoid disease No HTN Nonsmoker Denies excessive alcohol use 2 half brothers with MI early 36s. Father had heart disease in 50s , mom none She has prolong bleeding time followed by hematology She has a hx of restless leg , but no no n/t in arms  She has depression and is on Lexapro and states she is doing ok with this. No SES, no SI /HI She works for [employer] at [employer] and states her job is not stressful, daughter in college is home for  holidays   Past Medical History  Diagnosis Date  . Thyroid disease     Post-partum  . Dysplastic nevus     followed yearly by [facility].    . Bleeding gums   . Hx of transfusion of packed red blood cells   . Bleeding disorder ([place]) 08/31/2013  . Clotting disorder ([place])   . Restless legs syndrome (RLS) 05/18/2014  . Hx of adenomatous polyp of colon 09/21/2014  . Vertigo, intermittent 01/09/2015  . Spinal stenosis of lumbar region 09/22/2015   Past Surgical History  Procedure Laterality Date  . Partial hysterectomy  11/04/2004    DUB/adenomyosis. Ovaries intact.  [name]  Marland Kitchen Resection dysplastic nevus  2012  . Nasal fracture surgery  1995    post-op hemorrhage  . Breast surgery Left 2007    removal of a severely dysplastic nevus vs. melanoma   Social History   Social History  . Marital Status: Married    Spouse Name: N/A  . Number of Children: 2  . Years of Education: AS   Occupational History  .  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 Smoking status: Never Smoker   . Smokeless tobacco: Never Used  . Alcohol Use: No  . Drug Use: No  . Sexual Activity: Yes    Birth Control/ Protection: None   Other Topics Concern  . None   Social History Narrative   Marital status: married x 23 years; happily  married; no abuse.      Children: 2 children (19, 17); no grandchildren.      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      Tobacco:  None      Alcohol: none      Drugs: none      Exercise:  Walking, strength training daily.      Seatbelt: 100%      Guns: none       Sunscreen:  SPF 30.   Patient is right handed.   Patient drinks 2 cups caffeine daily.   Family History  Problem Relation Age of Onset  . Colon cancer Mother 77  . Diabetes Mother   . Hypertension Mother   . Cancer Mother 2    colon cancer  . Dementia Mother   . Stroke Father 18  . Hypertension Brother   . Heart disease Brother   . Thyroid disease Sister   . Colon cancer Sister 17  . Cancer Sister 26    colon cancer  . COPD Sister   . Heart disease Sister 47    AMI  . Cancer  Brother 68    soft tissue cancer in throat/neck  . Cancer Brother 50    bone cancer  . Heart disease Brother     multiple AMI   No Known Allergies Prior to Admission medications   Medication Sig Start Date End Date Taking? Authorizing Provider  cholecalciferol (VITAMIN D) 1000 UNITS tablet Take 4,000 Units by mouth daily.    Yes Historical Provider, MD  desmopressin (STIMATE) 1.5 MG/ML SOLN Place 1.5 sprays into the nose as needed. 12/06/13  Yes Historical Provider, MD  escitalopram (LEXAPRO) 10 MG tablet Take 1 tablet (10 mg total) by mouth daily. 06/26/15  Yes Gay Filler Copland, MD  Multiple Vitamin (MULTIVITAMIN) capsule Take 1 capsule by mouth daily.   Yes Historical Provider, MD     ROS: The patient denies fevers, chills, night sweats, unintentional weight loss, palpitations, wheezing, dyspnea on exertion, nausea, vomiting, abdominal pain, dysuria, hematuria, melena, acute  numbness, weakness, or tingling.   All other systems have been reviewed and were otherwise negative with the exception of those mentioned in the HPI and as above.    PHYSICAL EXAM: Filed Vitals:   11/01/15 1817  BP: 124/76  Pulse: 69  Temp: 98.8 F (37.1 C)  Resp: 16   BP Readings from Last 3 Encounters:  11/01/15 124/76  09/22/15 117/70  08/25/15 122/69   SpO2 Readings from Last 3 Encounters:  11/01/15 99%  09/16/14 100%  05/11/14 100%    Body mass index is 28.63 kg/(m^2).   General: Alert, no acute distress HEENT:  Normocephalic, atraumatic, oropharynx patent. EOMI, PERRLA Cardiovascular:  Regular rate and rhythm, no rubs murmurs or gallops.  No Carotid bruits, radial pulse intact. No pedal edema.  Respiratory: Clear to auscultation bilaterally.  No wheezes, rales, or rhonchi.  No cyanosis, no use of accessory musculature Abdominal: No organomegaly, abdomen is soft and non-tender, positive bowel sounds. No masses. Skin: No rashes. Neurologic: Facial musculature symmetric. Psychiatric: Patient  acts appropriately throughout our interaction. Lymphatic: No cervical or submandibular lymphadenopathy Musculoskeletal: Gait intact. No edema, tenderness   LABS: Results for orders placed or performed in visit on 11/01/15  POCT CBC  Result Value Ref Range   WBC 6.7 4.6 - 10.2 K/uL   Lymph, poc 1.7 0.6 - 3.4   POC LYMPH PERCENT 24.8 10 - 50 %L   MID (cbc)  0.4 0 - 0.9   POC MID % 6.1 0 - 12 %M   POC Granulocyte 4.6 2 - 6.9   Granulocyte percent 69.1 37 - 80 %G   RBC 4.37 4.04 - 5.48 M/uL   Hemoglobin 13.5 12.2 - 16.2 g/dL   HCT, POC 38.8 37.7 - 47.9 %   MCV 88.9 80 - 97 fL   MCH, POC 30.9 27 - 31.2 pg   MCHC 34.8 31.8 - 35.4 g/dL   RDW, POC 13.4 %   Platelet Count, POC 230 142 - 424 K/uL   MPV 7.3 0 - 99.8 fL     EKG/XRAY:   Primary read interpreted by [facility]. Marin Comment at [facility]. Neg for acute cardiopulmonary process   ASSESSMENT/PLAN: Encounter Diagnoses  Name Primary?  . Chest pain, unspecified chest pain type Yes  . Atypical chest pain   . Bleeding disorder [facility])    Mrs Vanderhart is a 49 y/o Caucasian female with no significant family hx or personla hx of CAD who presents with a 3 day hx of midsubsternal CP that was intermittent at rest/exertion which in the last 48 hours has progressed to be more constant with radiation to her left chest and back. She denies any palpitations, diaphoresis, abd pain, n/v, numbness weakness or tingling. She cannot take ASA due to hx of increase bleeding time, she does have a hx of GERD but does not feel like it. She is stressed. She is in the middle of a divorce. She has seen cardilogy and had echo done for plapitations and also CP in 04/2013 with nl echocradiogram in 03/2013.  She has had this for 3 days , declines to go to ER, low risk for MI Will get stat troponin Labs pending [phone] Precautions to go to ED prn   Gross sideeffects, risk and benefits, and alternatives of medications d/w patient. Patient is aware that all medications have  potential sideeffects and we are unable to predict every sideeffect or drug-drug interaction that may occur.  Makia Bossi DO  11/01/2015 10:20 PM

## 2015-11-01 NOTE — Patient Instructions (Signed)

## 2015-11-02 ENCOUNTER — Telehealth: Payer: Self-pay | Admitting: Family Medicine

## 2015-11-02 LAB — COMPLETE METABOLIC PANEL WITHOUT GFR
ALT: 10 U/L (ref 6–29)
AST: 13 U/L (ref 10–35)
Albumin: 4.5 g/dL (ref 3.6–5.1)
BUN: 16 mg/dL (ref 7–25)
Creat: 0.66 mg/dL (ref 0.50–1.10)
Glucose, Bld: 76 mg/dL (ref 65–99)
Potassium: 3.9 mmol/L (ref 3.5–5.3)
Sodium: 138 mmol/L (ref 135–146)
Total Protein: 7.5 g/dL (ref 6.1–8.1)

## 2015-11-02 LAB — COMPLETE METABOLIC PANEL WITH GFR
Alkaline Phosphatase: 51 U/L (ref 33–115)
CO2: 27 mmol/L (ref 20–31)
Calcium: 9.6 mg/dL (ref 8.6–10.2)
Chloride: 100 mmol/L (ref 98–110)
GFR, Est African American: >89 mL/min (ref >=60)
GFR, Est Non African American: >89 mL/min (ref >=60)
Total Bilirubin: 0.4 mg/dL (ref 0.2–1.2)

## 2015-11-02 LAB — TSH: TSH: 4.125 u[IU]/mL (ref 0.350–4.500)

## 2015-11-02 NOTE — Telephone Encounter (Signed)
Lm that troponin was negative 

## 2015-11-24 ENCOUNTER — Encounter: Payer: Self-pay | Admitting: Family Medicine

## 2015-11-29 ENCOUNTER — Encounter: Payer: Self-pay | Admitting: Family Medicine

## 2015-12-13 DIAGNOSIS — D2272 Melanocytic nevi of left lower limb, including hip: Secondary | ICD-10-CM | POA: Diagnosis not present

## 2015-12-13 DIAGNOSIS — D2362 Other benign neoplasm of skin of left upper limb, including shoulder: Secondary | ICD-10-CM | POA: Diagnosis not present

## 2015-12-13 DIAGNOSIS — D1801 Hemangioma of skin and subcutaneous tissue: Secondary | ICD-10-CM | POA: Diagnosis not present

## 2015-12-13 DIAGNOSIS — D2372 Other benign neoplasm of skin of left lower limb, including hip: Secondary | ICD-10-CM | POA: Diagnosis not present

## 2015-12-13 DIAGNOSIS — L814 Other melanin hyperpigmentation: Secondary | ICD-10-CM | POA: Diagnosis not present

## 2015-12-13 DIAGNOSIS — D485 Neoplasm of uncertain behavior of skin: Secondary | ICD-10-CM | POA: Diagnosis not present

## 2015-12-13 DIAGNOSIS — D225 Melanocytic nevi of trunk: Secondary | ICD-10-CM | POA: Diagnosis not present

## 2015-12-26 MED FILL — ESCITALOPRAM 10 MG TABLET: 10 | 60 days supply | Qty: 60 | Fill #3

## 2016-01-10 ENCOUNTER — Ambulatory Visit (INDEPENDENT_AMBULATORY_CARE_PROVIDER_SITE_OTHER): Payer: 59 | Admitting: Urgent Care

## 2016-01-10 ENCOUNTER — Ambulatory Visit (INDEPENDENT_AMBULATORY_CARE_PROVIDER_SITE_OTHER): Payer: 59

## 2016-01-10 VITALS — BP 128/80 | HR 67 | Temp 98.1°F | Resp 17 | Ht 65.0 in | Wt 171.0 lb

## 2016-01-10 DIAGNOSIS — R0602 Shortness of breath: Secondary | ICD-10-CM | POA: Diagnosis not present

## 2016-01-10 DIAGNOSIS — J189 Pneumonia, unspecified organism: Secondary | ICD-10-CM | POA: Diagnosis not present

## 2016-01-10 DIAGNOSIS — D691 Qualitative platelet defects: Secondary | ICD-10-CM

## 2016-01-10 DIAGNOSIS — R059 Cough, unspecified: Secondary | ICD-10-CM

## 2016-01-10 DIAGNOSIS — R0789 Other chest pain: Secondary | ICD-10-CM | POA: Diagnosis not present

## 2016-01-10 DIAGNOSIS — R05 Cough: Secondary | ICD-10-CM

## 2016-01-10 MED ORDER — HYDROCODONE-HOMATROPINE 5-1.5 MG/5ML PO SYRP: 5.0000 mL | ORAL_SOLUTION | Freq: Every evening | ORAL | Status: DC | PRN

## 2016-01-10 MED ORDER — BENZONATATE 100 MG PO CAPS: 100.0000 mg | ORAL_CAPSULE | Freq: Three times a day (TID) | ORAL | Status: DC | PRN

## 2016-01-10 MED ORDER — AZITHROMYCIN 250 MG PO TABS
ORAL_TABLET | ORAL | Status: DC
Start: 2016-01-10 — End: 2016-01-17

## 2016-01-10 MED FILL — AZITHROMYCIN 250 MG TABLET: 250 | 5 days supply | Qty: 6 | Fill #0

## 2016-01-10 MED FILL — BENZONATATE 100 MG CAPSULE: 100 | 7 days supply | Qty: 40 | Fill #0

## 2016-01-10 MED FILL — HYDROCODONE-HOMATROPINE SYR: 5-1.5 | 24 days supply | Qty: 120 | Fill #0

## 2016-01-10 NOTE — Patient Instructions (Addendum)
Community-Acquired Pneumonia, Adult Pneumonia is an infection of the lungs. There are different types of pneumonia. One type can develop while a person is in a hospital. A different type, called community-acquired pneumonia, develops in people who are not, or have not recently been, in the hospital or other health care facility.  CAUSES Pneumonia may be caused by bacteria, viruses, or funguses. Community-acquired pneumonia is often caused by Streptococcus pneumonia bacteria. These bacteria are often passed from one person to another by breathing in droplets from the cough or sneeze of an infected person. RISK FACTORS The condition is more likely to develop in:  People who havechronic diseases, such as chronic obstructive pulmonary disease (COPD), asthma, congestive heart failure, cystic fibrosis, diabetes, or kidney disease.  People who haveearly-stage or late-stage HIV.  People who havesickle cell disease.  People who havehad their spleen removed (splenectomy).  People who havepoor Human resources officer.  People who havemedical conditions that increase the risk of breathing in (aspirating) secretions their own mouth and nose.   People who havea weakened immune system (immunocompromised).  People who smoke.  People whotravel to areas where pneumonia-causing germs commonly exist.  People whoare around animal habitats or animals that have pneumonia-causing germs, including birds, bats, rabbits, cats, and farm animals. SYMPTOMS Symptoms of this condition include:  Adry cough.  A wet (productive) cough.  Fever.  Sweating.  Chest pain, especially when breathing deeply or coughing.  Rapid breathing or difficulty breathing.  Shortness of breath.  Shaking chills.  Fatigue.  Muscle aches. DIAGNOSIS Your health care provider will take a medical history and perform a physical exam. You may also have other tests, including:  Imaging studies of your chest, including  X-rays.  Tests to check your blood oxygen level and other blood gases.  Other tests on blood, mucus (sputum), fluid around your lungs (pleural fluid), and urine. If your pneumonia is severe, other tests may be done to identify the specific cause of your illness. TREATMENT The type of treatment that you receive depends on many factors, such as the cause of your pneumonia, the medicines you take, and other medical conditions that you have. For most adults, treatment and recovery from pneumonia may occur at home. In some cases, treatment must happen in a hospital. Treatment may include:  Antibiotic medicines, if the pneumonia was caused by bacteria.  Antiviral medicines, if the pneumonia was caused by a virus.  Medicines that are given by mouth or through an IV tube.  Oxygen.  Respiratory therapy. Although rare, treating severe pneumonia may include:  Mechanical ventilation. This is done if you are not breathing well on your own and you cannot maintain a safe blood oxygen level.  Thoracentesis. This procedureremoves fluid around one lung or both lungs to help you breathe better. HOME CARE INSTRUCTIONS  Take over-the-counter and prescription medicines only as told by your health care provider.  Only takecough medicine if you are losing sleep. Understand that cough medicine can prevent your body's natural ability to remove mucus from your lungs.  If you were prescribed an antibiotic medicine, take it as told by your health care provider. Do not stop taking the antibiotic even if you start to feel better.  Sleep in a semi-upright position at night. Try sleeping in a reclining chair, or place a few pillows under your head.  Do not use tobacco products, including cigarettes, chewing tobacco, and e-cigarettes. If you need help quitting, ask your health care provider.  Drink enough water to keep your urine  clear or pale yellow. This will help to thin out mucus secretions in your  lungs. PREVENTION There are ways that you can decrease your risk of developing community-acquired pneumonia. Consider getting a pneumococcal vaccine if:  You are older than 48 years of age.  You are older than 48 years of age and are undergoing cancer treatment, have chronic lung disease, or have other medical conditions that affect your immune system. Ask your health care provider if this applies to you. There are different types and schedules of pneumococcal vaccines. Ask your health care provider which vaccination option is best for you. You may also prevent community-acquired pneumonia if you take these actions:  Get an influenza vaccine every year. Ask your health care provider which type of influenza vaccine is best for you.  Go to the dentist on a regular basis.  Wash your hands often. Use hand sanitizer if soap and water are not available. SEEK MEDICAL CARE IF:  You have a fever.  You are losing sleep because you cannot control your cough with cough medicine. SEEK IMMEDIATE MEDICAL CARE IF:  You have worsening shortness of breath.  You have increased chest pain.  Your sickness becomes worse, especially if you are an older adult or have a weakened immune system.  You cough up blood.   This information is not intended to replace advice given to you by your health care provider. Make sure you discuss any questions you have with your health care provider.   Document Released: 10/21/2005 Document Revised: 07/12/2015 Document Reviewed: 02/15/2015 Elsevier Interactive Patient Education [employer].   Because you received an x-ray today, you will receive an invoice from [facility]. Please contact [facility] at [phone] with questions or concerns regarding your invoice. Our billing staff will not be able to assist you with those questions.

## 2016-01-10 NOTE — Progress Notes (Signed)
    MRN: [id number] DOB: 1966-05-12  Subjective:   Deanna [name] is a 50 y.o. female presenting for chief complaint of Cough; Shortness of Breath; Headache; and Sinusitis  Reports 1 week history of worsening productive cough, now causing chest pain and shortness of breath. Patient's symptoms started out as well with headache, sinus congestion, upper tooth pain, sore throat, decreased appetite, nausea, fatigue. She has used Mucinex and Tylenol with minimal relief. Denies smoking cigarettes, history of asthma.  Keydi has a current medication list which includes the following prescription(s): cholecalciferol, desmopressin, escitalopram, and multivitamin. Also has No Known Allergies.  Halina  has a past medical history of Thyroid disease; Dysplastic nevus; Bleeding gums; transfusion of packed red blood cells; Bleeding disorder ([place]) (08/31/2013); Clotting disorder ([place]); Restless legs syndrome (RLS) (05/18/2014); adenomatous polyp of colon (09/21/2014); Vertigo, intermittent (01/09/2015); and Spinal stenosis of lumbar region (09/22/2015). Also  has past surgical history that includes Partial hysterectomy (11/04/2004); resection dysplastic nevus (2012); Nasal fracture surgery (1995); and Breast surgery (Left, 2007).  Objective:   Vitals: BP 128/80 mmHg  Pulse 67  Temp(Src) 98.1 F (36.7 C) (Oral)  Resp 17  Ht 5\' 5"  (1.651 m)  Wt 171 lb (77.565 kg)  BMI 28.46 kg/m2  SpO2 98%  Physical Exam  Constitutional: She is oriented to person, place, and time. She appears well-developed and well-nourished.  HENT:  TM's intact bilaterally, no effusions or erythema. Nasal turbinates pink and moist, nasal passages patent. No sinus tenderness. Oropharynx clear, mucous membranes moist, dentition in good repair.  Eyes: Right eye exhibits no discharge. Left eye exhibits no discharge. No scleral icterus.  Neck: Normal range of motion. Neck supple.  Cardiovascular: Normal rate, regular rhythm and intact  distal pulses.  Exam reveals no gallop and no friction rub.   No murmur heard. Pulmonary/Chest: No respiratory distress. She has wheezes (with cough bibasilar fields). She has rales (right mid-lower base).  Lymphadenopathy:    She has no cervical adenopathy.  Neurological: She is alert and oriented to person, place, and time.  Skin: Skin is warm and dry.   Dg Chest 2 View  01/10/2016  CLINICAL DATA:  Productive cough and chest pain and shortness of Breath EXAM: CHEST  2 VIEW COMPARISON:  11/01/2015 FINDINGS: The heart size and mediastinal contours are within normal limits. Both lungs are clear. The visualized skeletal structures are unremarkable. Postsurgical changes are noted in the right axilla. IMPRESSION: No active cardiopulmonary disease. Electronically Signed   By: Inez Catalina M.D.   On: 01/10/2016 11:53   Assessment and Plan :   1. CAP (community acquired pneumonia) 2. Cough 3. Atypical chest pain 4. Shortness of breath - Will cover patient for infectious process given physical exam findings and HPI. RTC in 1 week if no improvement.  5. 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

## 2016-01-17 ENCOUNTER — Ambulatory Visit (INDEPENDENT_AMBULATORY_CARE_PROVIDER_SITE_OTHER): Payer: 59 | Admitting: Family Medicine

## 2016-01-17 ENCOUNTER — Ambulatory Visit (INDEPENDENT_AMBULATORY_CARE_PROVIDER_SITE_OTHER): Payer: 59

## 2016-01-17 VITALS — BP 126/80 | HR 82 | Temp 98.9°F | Resp 16 | Ht 65.0 in | Wt 172.0 lb

## 2016-01-17 DIAGNOSIS — J988 Other specified respiratory disorders: Secondary | ICD-10-CM

## 2016-01-17 DIAGNOSIS — R05 Cough: Secondary | ICD-10-CM | POA: Diagnosis not present

## 2016-01-17 DIAGNOSIS — R0600 Dyspnea, unspecified: Secondary | ICD-10-CM

## 2016-01-17 DIAGNOSIS — R058 Other specified cough: Secondary | ICD-10-CM

## 2016-01-17 DIAGNOSIS — J22 Unspecified acute lower respiratory infection: Secondary | ICD-10-CM

## 2016-01-17 DIAGNOSIS — R0602 Shortness of breath: Secondary | ICD-10-CM | POA: Diagnosis not present

## 2016-01-17 DIAGNOSIS — R5383 Other fatigue: Secondary | ICD-10-CM | POA: Diagnosis not present

## 2016-01-17 DIAGNOSIS — R509 Fever, unspecified: Secondary | ICD-10-CM | POA: Diagnosis not present

## 2016-01-17 LAB — POCT CBC
GRANULOCYTE PERCENT: 68.8 % (ref 37–80)
HEMATOCRIT: 38.3 % (ref 37.7–47.9)
HEMOGLOBIN: 13.6 g/dL (ref 12.2–16.2)
Lymph, poc: 1.5 (ref 0.6–3.4)
MCH, POC: 31.5 pg — AB (ref 27–31.2)
MCHC: 35.4 g/dL (ref 31.8–35.4)
MCV: 88.8 fL (ref 80–97)
MID (cbc): 0.4 (ref 0–0.9)
MPV: 6.9 fL (ref 0–99.8)
POC Granulocyte: 4.1 (ref 2–6.9)
POC LYMPH PERCENT: 25.1 %L (ref 10–50)
POC MID %: 6.1 %M (ref 0–12)
Platelet Count, POC: 316 10*3/uL (ref 142–424)
RBC: 4.31 M/uL (ref 4.04–5.48)
RDW, POC: 12.5 %
WBC: 6 10*3/uL (ref 4.6–10.2)

## 2016-01-17 LAB — POCT INFLUENZA A/B
INFLUENZA A, POC: NEGATIVE
Influenza B, POC: NEGATIVE

## 2016-01-17 MED ORDER — ALBUTEROL SULFATE (2.5 MG/3ML) 0.083% IN NEBU
2.5000 mg | INHALATION_SOLUTION | Freq: Once | RESPIRATORY_TRACT | Status: AC
  Administered 2016-01-17: 2.5 mg via RESPIRATORY_TRACT

## 2016-01-17 MED ORDER — LEVOFLOXACIN 750 MG PO TABS: 750.0000 mg | ORAL_TABLET | Freq: Every day | ORAL | Status: AC

## 2016-01-17 MED ORDER — IPRATROPIUM BROMIDE 0.02 % IN SOLN
0.5000 mg | Freq: Once | RESPIRATORY_TRACT | Status: AC
  Administered 2016-01-17: 0.5 mg via RESPIRATORY_TRACT

## 2016-01-17 MED ORDER — PREDNISONE 20 MG PO TABS: ORAL_TABLET | ORAL | Status: DC

## 2016-01-17 MED FILL — predniSONE 20 MG TABS: 20 | 9 days supply | Qty: 18 | Fill #0

## 2016-01-17 MED FILL — levoFLOXacin 750 MG TABS: 750 | 5 days supply | Qty: 5 | Fill #0

## 2016-01-17 NOTE — Patient Instructions (Addendum)
IF you received an x-ray today, you will receive an invoice from [facility]. Please contact [facility] at [phone] with questions or concerns regarding your invoice.   IF you received labwork today, you will receive an invoice from [employer]. Please contact [facility] at [phone] with questions or concerns regarding your invoice.   Our billing staff will not be able to assist you with questions regarding bills from these companies.  You will be contacted with the lab results as soon as they are available. The fastest way to get your results is to activate your My Chart account. Instructions are located on the last page of this paperwork. If you have not heard from [place] regarding the results in 2 weeks, please contact this office.  Continue to hydrate with 64 oz or more water per day. I would like you to start the medications as prescribed. Please let me know if you have no improvement of your symptoms in the next 48-72 hours. If you develop uncontrolled fever, trouble with breathing, or nausea, or dizziness--you need to come in sooner or go to the  ED.

## 2016-01-17 NOTE — Progress Notes (Signed)
[facility] [address] [place] [address] [phone]  Date:  01/17/2016   Name:  Deanna [name]   DOB:  November 09, 1965   MRN:  [id number]  PCP:  Lamar Blinks, MD    History of Present Illness:  Deanna [name] is a 50 y.o. female patient who presents to [facility] for follow up of cough.  Patient states that she is not feeling any better.  She was seen here 1 week ago for 1 week of cough, sinus congestion, and headaches.  She was given azithromycin and hycodan for cough.  She reports taking the full dosage of the azithromycin.  She had some improvement of her symptoms minimally within the first 3 days, but then her improvement stopped.  She continues to have a productive cough of green sputum.  Headaches secondary to coughing.  She has facial and left ear pain.  She has no energy.  She feels easily winded and sob with minor activites such as walking up the stairs.  She has no fever.  She tried to attempt work, but felt too bad to return.  She works in [employer] with [employer].      Patient Active Problem List   Diagnosis Date Noted  . Spinal stenosis of lumbar region 09/22/2015  . Vertigo, intermittent 01/09/2015  . Hx of adenomatous polyp of colon 09/21/2014  . Restless legs syndrome (RLS) 05/18/2014  . 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 Screening for breast cancer 05/11/2014  . Fatigue 10/15/2013  . Bleeding disorder ([place]) 08/31/2013  . Occipital neuralgia 07/19/2013  . Paresthesias 07/19/2013  . Hematuria 07/19/2013  . Family history of colon cancer requiring screening colonoscopy 04/12/2013  . Bleeding gums 04/12/2013  . Breast cancer screening 04/12/2013  . Other specified periodontal diseases 04/12/2013  . Other malaise and fatigue 04/12/2013  . Anemia, unspecified 04/12/2013  . Unspecified vitamin D deficiency 04/12/2013  . Palpitations 03/09/2013  . Bleeding 03/09/2013  . Thyroid disease   . Dysplastic nevus     Past Medical  History  Diagnosis Date  . Thyroid disease     Post-partum  . Dysplastic nevus     followed yearly by [facility].  . Bleeding gums   . Hx of transfusion of packed red blood cells   . Bleeding disorder ([place]) 08/31/2013  . Clotting disorder ([place])   . Restless legs syndrome (RLS) 05/18/2014  . Hx of adenomatous polyp of colon 09/21/2014  . Vertigo, intermittent 01/09/2015  . Spinal stenosis of lumbar region 09/22/2015    Past Surgical History  Procedure Laterality Date  . Partial hysterectomy  11/04/2004    DUB/adenomyosis. Ovaries intact.  [name]  Marland Kitchen Resection dysplastic nevus  2012  . Nasal fracture surgery  1995    post-op hemorrhage  . Breast surgery Left 2007    removal of a severely dysplastic nevus vs. melanoma    Social History  Substance Use Topics  . Smoking status: Never Smoker   . Smokeless tobacco: Never Used  . Alcohol Use: No    Family History  Problem Relation Age of Onset  . Colon cancer Mother 73  . Diabetes Mother   . Hypertension Mother   . Cancer Mother 53    colon cancer  . Dementia Mother   . Stroke Father 66  . Hypertension Brother   . Heart disease Brother   . Thyroid disease Sister   . Colon cancer Sister 85  . Cancer Sister 75  colon cancer  . COPD Sister   . Heart disease Sister 78    AMI  . Cancer Brother 68    soft tissue cancer in throat/neck  . Cancer Brother 66    bone cancer  . Heart disease Brother     multiple AMI    No Known Allergies  Medication list has been reviewed and updated.  Current Outpatient Prescriptions on File Prior to Visit  Medication Sig Dispense Refill  . benzonatate (TESSALON) 100 MG capsule Take 1-2 capsules (100-200 mg total) by mouth 3 (three) times daily as needed for cough. 40 capsule 0  . escitalopram (LEXAPRO) 10 MG tablet Take 1 tablet (10 mg total) by mouth daily. 60 tablet 5  . HYDROcodone-homatropine (HYCODAN) 5-1.5 MG/5ML syrup Take 5 mLs by mouth at bedtime as needed. 120 mL 0    No current facility-administered medications on file prior to visit.    ROS ROS otherwise unremarkable unless listed above.   Physical Examination: BP 126/80 mmHg  Pulse 82  Temp(Src) 98.9 F (37.2 C) (Oral)  Resp 16  Ht 5\' 5"  (1.651 m)  Wt 172 lb (78.019 kg)  BMI 28.62 kg/m2  SpO2 98% Ideal Body Weight: Weight in (lb) to have BMI = 25: 149.9  Physical Exam  Constitutional: She is oriented to person, place, and time. She appears well-developed and well-nourished. No distress.  HENT:  Head: Normocephalic and atraumatic.  Right Ear: External ear normal.  Left Ear: External ear normal.  Eyes: Conjunctivae and EOM are normal. Pupils are equal, round, and reactive to light.  Cardiovascular: Normal rate.   Pulmonary/Chest: Effort normal. No respiratory distress. She has wheezes. She has rales (amplified diffused expiratory rales.).  Neurological: She is alert and oriented to person, place, and time.  Skin: She is not diaphoretic.  Psychiatric: She has a normal mood and affect. Her behavior is normal.     Assessment and Plan: Deanna [name] is a 50 y.o. female who is here today for follow up. This may be a post-viral inflammatory process.  Will treat with 2nd round of abx, and will couple this with prednisone to cut down this inflammation and open up the airways to reduce bronchospasms.  Advised to return in 72 hours, on Saturday, to follow up with Dr. Linna Darner. Plan, tx, and hx obtained also by Dr. Linna Darner.   Lower respiratory infection (e.g., bronchitis, pneumonia, pneumonitis, pulmonitis) - Plan: levofloxacin (LEVAQUIN) 750 MG tablet, predniSONE (DELTASONE) 20 MG tablet  Productive cough - Plan: POCT CBC, albuterol (PROVENTIL) (2.5 MG/3ML) 0.083% nebulizer solution 2.5 mg, ipratropium (ATROVENT) nebulizer solution 0.5 mg, POCT Influenza A/B, DG Chest 2 View, levofloxacin (LEVAQUIN) 750 MG tablet  Dyspnea - Plan: albuterol (PROVENTIL) (2.5 MG/3ML) 0.083% nebulizer solution  2.5 mg, ipratropium (ATROVENT) nebulizer solution 0.5 mg, POCT Influenza A/B, DG Chest 2 View, levofloxacin (LEVAQUIN) 750 MG tablet  Other fatigue - Plan: POCT Influenza A/B, levofloxacin (LEVAQUIN) 750 MG tablet   Ivar Drape, PA-C [facility] 01/17/2016 4:08 PM

## 2016-01-18 NOTE — Progress Notes (Signed)
50 year old lady who comes in with respiratory problems. She has had a severe and persistent cough despite a recent course of Keflex. She was seen by Ivar Drape, PA-C. The chest x-ray had not yet been interpreted by the radiologist, so I helped interpret the film with her.. No acute infiltrate was visible. History was reviewed and discussed. I examined the patient and agreed with her having bilateral rhonchi and wheezes. Patient does not feel like the nebulizer has helped a lot. We discussed treatment plan including steroids and antibiotic choices. She will be given an inhaler even though it didn't help a lot in the office. She was cautioned to return if at all worse, and come back for follow-up as directed.  Posey Boyer M.D.

## 2016-01-20 ENCOUNTER — Ambulatory Visit (INDEPENDENT_AMBULATORY_CARE_PROVIDER_SITE_OTHER): Payer: 59 | Admitting: Family Medicine

## 2016-01-20 VITALS — BP 115/70 | HR 75 | Temp 98.8°F | Ht 64.25 in | Wt 171.0 lb

## 2016-01-20 DIAGNOSIS — G4700 Insomnia, unspecified: Secondary | ICD-10-CM

## 2016-01-20 DIAGNOSIS — R062 Wheezing: Secondary | ICD-10-CM | POA: Diagnosis not present

## 2016-01-20 DIAGNOSIS — R059 Cough, unspecified: Secondary | ICD-10-CM

## 2016-01-20 DIAGNOSIS — R05 Cough: Secondary | ICD-10-CM | POA: Diagnosis not present

## 2016-01-20 NOTE — Patient Instructions (Addendum)
Take Benadryl at bedtime  Continue your other medications  Continue trying to drink plenty of fluids  Stay off work at least through Monday. If at all worse at any time come back. If not significantly better by Tuesday please return         IF you received an x-ray today, you will receive an invoice from [facility]. Please contact [facility] at [phone] with questions or concerns regarding your invoice.   IF you received labwork today, you will receive an invoice from [employer]. Please contact [facility] at [phone] with questions or concerns regarding your invoice.   Our billing staff will not be able to assist you with questions regarding bills from these companies.  You will be contacted with the lab results as soon as they are available. The fastest way to get your results is to activate your My Chart account. Instructions are located on the last page of this paperwork. If you have not heard from [place] regarding the results in 2 weeks, please contact this office.

## 2016-01-20 NOTE — Progress Notes (Signed)
Patient ID: FIYINFOLUWA BRISCO, female    DOB: 04/16/1966  Age: 50 y.o. MRN: [id number]  Chief Complaint  Patient presents with  . Follow-up    pneumonia check  . Pneumonia    Subjective:   Patient is here for a recheck. She thinks she is feeling a little bit tender though she still coughing a lot. She is not wheezing as much. She is not running a fever. She continues to take her medications regular. Her flu test was negative. The radiologist did not read a lot on the film  Current allergies, medications, problem list, past/family and social histories reviewed.  Objective:  BP 115/70 mmHg  Pulse 75  Temp(Src) 98.8 F (37.1 C) (Oral)  Ht 5' 4.25" (1.632 m)  Wt 171 lb (77.565 kg)  BMI 29.12 kg/m2  SpO2 98%  No major acute distress but still looks very weak however . She was lying down when I went in the room. Her TMs are normal. Throat clear. Neck supple without nodes. Chest no longer wheezing. Heart regular without murmurs. No rhonchi or rales.  Assessment & Plan:   Assessment: 1. Wheezing   2. Cough   3. Insomnia       Plan: Follow-up on wheezing. I do not think further x-rays and all are needed yet. Continue current medications. She is not sleeping on the prednisone so we'll try to take a little Benadryl for that.  No orders of the defined types were placed in this encounter.    No orders of the defined types were placed in this encounter.         Patient Instructions   Take Benadryl at bedtime  Continue your other medications  Continue trying to drink plenty of fluids  Stay off work at least through Monday. If at all worse at any time come back. If not significantly better by Tuesday please return         IF you received an x-ray today, you will receive an invoice from [facility]. Please contact [facility] at [phone] with questions or concerns regarding your invoice.   IF you received labwork today, you will receive an  invoice from [employer]. Please contact [facility] at [phone] with questions or concerns regarding your invoice.   Our billing staff will not be able to assist you with questions regarding bills from these companies.  You will be contacted with the lab results as soon as they are available. The fastest way to get your results is to activate your My Chart account. Instructions are located on the last page of this paperwork. If you have not heard from [place] regarding the results in 2 weeks, please contact this office.         Return if symptoms worsen or fail to improve.   Tristen Pennino, MD 01/20/2016

## 2016-03-19 ENCOUNTER — Encounter: Payer: Self-pay | Admitting: Neurology

## 2016-03-19 ENCOUNTER — Ambulatory Visit (INDEPENDENT_AMBULATORY_CARE_PROVIDER_SITE_OTHER): Payer: 59 | Admitting: Neurology

## 2016-03-19 VITALS — BP 110/68 | HR 72 | Ht 64.25 in | Wt 178.0 lb

## 2016-03-19 DIAGNOSIS — G2581 Restless legs syndrome: Secondary | ICD-10-CM | POA: Diagnosis not present

## 2016-03-19 DIAGNOSIS — M48061 Spinal stenosis, lumbar region without neurogenic claudication: Secondary | ICD-10-CM

## 2016-03-19 DIAGNOSIS — M4806 Spinal stenosis, lumbar region: Secondary | ICD-10-CM

## 2016-03-19 NOTE — Progress Notes (Signed)
Reason for visit: Back pain, leg discomfort  Deanna [name] is an 50 y.o. female  History of present illness:  Deanna [name] is a 50 year old right-handed white female with a history of lumbosacral spinal stenosis at the L4-5 level, and a history of bilateral lower extremity discomfort. The patient does have some back pain that is particularly noticeable when she is standing. The patient has discomfort in the legs that seems to worsen with ambulation and activity, better when she is resting. The patient has restless legs when she is resting, however. The patient has had an epidural steroid injection with minimal benefit. She has been on a recent course of steroids that have improved some of the leg discomfort and back pain. The patient is getting ready for move, and she has been particularly active loading boxes. The patient does have some mild gait instability, but no falls. She returns to the office today for an evaluation. She has noted that the distal portions of the feet are burning in a constant fashion at this point.  Past Medical History  Diagnosis Date  . Thyroid disease     Post-partum  . Dysplastic nevus     followed yearly by [facility].  . Bleeding gums   . Hx of transfusion of packed red blood cells   . Bleeding disorder ([place]) 08/31/2013  . Clotting disorder ([place])   . Restless legs syndrome (RLS) 05/18/2014  . Hx of adenomatous polyp of colon 09/21/2014  . Vertigo, intermittent 01/09/2015  . Spinal stenosis of lumbar region 09/22/2015    Past Surgical History  Procedure Laterality Date  . Partial hysterectomy  11/04/2004    DUB/adenomyosis. Ovaries intact.  [name]  Marland Kitchen Resection dysplastic nevus  2012  . Nasal fracture surgery  1995    post-op hemorrhage  . Breast surgery Left 2007    removal of a severely dysplastic nevus vs. melanoma    Family History  Problem Relation Age of Onset  . Colon cancer Mother 43  . Diabetes Mother   . Hypertension Mother    . Cancer Mother 26    colon cancer  . Dementia Mother   . Stroke Father 86  . Hypertension Brother   . Heart disease Brother   . Thyroid disease Sister   . Colon cancer Sister 96  . Cancer Sister 72    colon cancer  . COPD Sister   . Heart disease Sister 28    AMI  . Cancer Brother 68    soft tissue cancer in throat/neck  . Cancer Brother 74    bone cancer  . Heart disease Brother     multiple AMI    Social history:  reports that she has never smoked. She has never used smokeless tobacco. She reports that she does not drink alcohol or use illicit drugs.   No Known Allergies  Medications:  Prior to Admission medications   Medication Sig Start Date End Date Taking? Authorizing Provider  escitalopram (LEXAPRO) 10 MG tablet Take 1 tablet (10 mg total) by mouth daily. 06/26/15   Darreld Mclean, MD    ROS:  Out of a complete 14 system review of symptoms, the patient complains only of the following symptoms, and all other reviewed systems are negative.  Fatigue, weight gain Eye itching Incontinence of the bladder Numbness  Blood pressure 110/68, pulse 72, height 5' 4.25" (1.632 m), weight 178 lb (80.74 kg).  Physical Exam  General: The patient is alert and cooperative at  the time of the examination. The patient is minimally to moderately obese.  Skin: No significant peripheral edema is noted.   Neurologic Exam  Mental status: The patient is alert and oriented x 3 at the time of the examination. The patient has apparent normal recent and remote memory, with an apparently normal attention span and concentration ability.   Cranial nerves: Facial symmetry is present. Speech is normal, no aphasia or dysarthria is noted. Extraocular movements are full. Visual fields are full.  Motor: The patient has good strength in all 4 extremities.  Sensory examination: Soft touch sensation is symmetric on the face, arms, and legs.  Coordination: The patient has good  finger-nose-finger and heel-to-shin bilaterally.  Gait and station: The patient has a normal gait. Tandem gait is normal. Romberg is negative. No drift is seen.  Reflexes: Deep tendon reflexes are symmetric.   Assessment/Plan:  1. Lumbosacral spinal stenosis, L4-5 level  2. Leg discomfort, questionable peripheral neuropathy  3. Restless leg syndrome  The patient is having ongoing discomfort in the back and legs. The patient does not wish to go on oral medications such as gabapentin or Cymbalta at the moment. The patient will be sent back for an epidural steroid injection, if this is not helpful, we will not continue these procedures. The patient will follow-up in 6 months, sooner if needed.  Jill Alexanders MD 03/19/2016 6:51 PM  [facility] [address] [place], [place] [address]  Phone [phone] Fax [phone]

## 2016-03-21 ENCOUNTER — Other Ambulatory Visit: Payer: Self-pay | Admitting: Neurology

## 2016-03-21 ENCOUNTER — Ambulatory Visit: Payer: 59 | Admitting: Neurology

## 2016-03-21 DIAGNOSIS — M48061 Spinal stenosis, lumbar region without neurogenic claudication: Secondary | ICD-10-CM

## 2016-03-29 MED FILL — ESCITALOPRAM 10 MG TABLET: 10 | 60 days supply | Qty: 60 | Fill #4

## 2016-04-10 ENCOUNTER — Other Ambulatory Visit: Payer: 59

## 2016-04-16 ENCOUNTER — Other Ambulatory Visit: Payer: 59

## 2016-04-22 ENCOUNTER — Ambulatory Visit
Admission: RE | Admit: 2016-04-22 | Discharge: 2016-04-22 | Disposition: A | Discharge status: Home / Self Care | Payer: 59 | Source: Ambulatory Visit | Attending: Neurology | Admitting: Neurology

## 2016-04-22 DIAGNOSIS — M48061 Spinal stenosis, lumbar region without neurogenic claudication: Secondary | ICD-10-CM

## 2016-04-22 DIAGNOSIS — M545 Low back pain: Secondary | ICD-10-CM | POA: Diagnosis not present

## 2016-04-22 MED ORDER — METHYLPREDNISOLONE ACETATE 40 MG/ML INJ SUSP (RADIOLOG
120.0000 mg | Freq: Once | INTRAMUSCULAR | Status: AC
  Administered 2016-04-22: 120 mg via EPIDURAL

## 2016-04-22 MED ORDER — IOPAMIDOL (ISOVUE-M 200) INJECTION 41%
1.0000 mL | Freq: Once | INTRAMUSCULAR | Status: AC
  Administered 2016-04-22: 1 mL via EPIDURAL

## 2016-04-22 NOTE — Discharge Instructions (Signed)

## 2016-05-08 DIAGNOSIS — H04123 Dry eye syndrome of bilateral lacrimal glands: Secondary | ICD-10-CM | POA: Diagnosis not present

## 2016-05-10 DIAGNOSIS — H04123 Dry eye syndrome of bilateral lacrimal glands: Secondary | ICD-10-CM | POA: Diagnosis not present

## 2016-06-27 ENCOUNTER — Other Ambulatory Visit: Payer: Self-pay | Admitting: Family Medicine

## 2016-06-27 DIAGNOSIS — F4321 Adjustment disorder with depressed mood: Secondary | ICD-10-CM

## 2016-06-28 ENCOUNTER — Other Ambulatory Visit: Payer: Self-pay | Admitting: Emergency Medicine

## 2016-07-01 MED FILL — ESCITALOPRAM 10 MG TABLET: 10 | 60 days supply | Qty: 60 | Fill #0

## 2016-07-05 MED FILL — ERYTHROMYCIN EYE OINTMENT: 5 | 10 days supply | Qty: 4 | Fill #0

## 2016-09-04 ENCOUNTER — Other Ambulatory Visit: Payer: Self-pay | Admitting: Physician Assistant

## 2016-09-04 ENCOUNTER — Ambulatory Visit (INDEPENDENT_AMBULATORY_CARE_PROVIDER_SITE_OTHER): Payer: 59 | Admitting: Physician Assistant

## 2016-09-04 VITALS — BP 110/76 | HR 65 | Temp 98.3°F | Resp 18 | Ht 64.25 in | Wt 186.0 lb

## 2016-09-04 DIAGNOSIS — R5383 Other fatigue: Secondary | ICD-10-CM | POA: Diagnosis not present

## 2016-09-04 LAB — CBC WITH DIFFERENTIAL/PLATELET
BASOS PCT: 0 %
Basophils Absolute: 0 cells/uL (ref 0–200)
Eosinophils Absolute: 228 cells/uL (ref 15–500)
Eosinophils Relative: 4 %
HEMATOCRIT: 40 % (ref 35.0–45.0)
HEMOGLOBIN: 12.9 g/dL (ref 11.7–15.5)
LYMPHS ABS: 1140 {cells}/uL (ref 850–3900)
Lymphocytes Relative: 20 %
MCH: 29.9 pg (ref 27.0–33.0)
MCHC: 32.3 g/dL (ref 32.0–36.0)
MCV: 92.6 fL (ref 80.0–100.0)
MONO ABS: 456 {cells}/uL (ref 200–950)
MPV: 9.9 fL (ref 7.5–12.5)
Monocytes Relative: 8 %
NEUTROS ABS: 3876 {cells}/uL (ref 1500–7800)
Neutrophils Relative %: 68 %
Platelets: 304 10*3/uL (ref 140–400)
RBC: 4.32 MIL/uL (ref 3.80–5.10)
RDW: 12.8 % (ref 11.0–15.0)
WBC: 5.7 10*3/uL (ref 3.8–10.8)

## 2016-09-04 LAB — COMPLETE METABOLIC PANEL WITH GFR
ALT: 9 U/L (ref 6–29)
AST: 14 U/L (ref 10–35)
Albumin: 4.1 g/dL (ref 3.6–5.1)
Alkaline Phosphatase: 62 U/L (ref 33–115)
BUN: 11 mg/dL (ref 7–25)
CALCIUM: 9.1 mg/dL (ref 8.6–10.2)
CO2: 29 mmol/L (ref 20–31)
CREATININE: 0.69 mg/dL (ref 0.50–1.10)
Chloride: 105 mmol/L (ref 98–110)
GFR, Est African American: >89 mL/min (ref >=60)
GFR, Est Non African American: >89 mL/min (ref >=60)
GLUCOSE: 87 mg/dL (ref 65–99)
Potassium: 4.8 mmol/L (ref 3.5–5.3)
Sodium: 139 mmol/L (ref 135–146)
Total Bilirubin: 0.4 mg/dL (ref 0.2–1.2)
Total Protein: 7.2 g/dL (ref 6.1–8.1)

## 2016-09-04 LAB — TSH: TSH: 1.76 m[IU]/L

## 2016-09-04 LAB — HIV ANTIBODY (ROUTINE TESTING W REFLEX): HIV 1&2 Ab, 4th Generation: NONREACTIVE

## 2016-09-04 MED ORDER — AZITHROMYCIN 250 MG PO TABS
ORAL_TABLET | ORAL | 0 refills | Status: DC
Start: 2016-09-04 — End: 2016-09-18

## 2016-09-04 MED FILL — AZITHROMYCIN 250 MG TABLET: 250 | 5 days supply | Qty: 6 | Fill #0

## 2016-09-04 NOTE — Patient Instructions (Addendum)
  I will contact you regarding your labs.  Please let me know if your symptoms worsen. I would like you to hydrate well with 64 oz of water if not more, and rest.   ekg is unchanged from the past.   IF you received an x-ray today, you will receive an invoice from [facility]. Please contact [facility] at [phone] with questions or concerns regarding your invoice.   IF you received labwork today, you will receive an invoice from [employer]. Please contact [facility] at [phone] with questions or concerns regarding your invoice.   Our billing staff will not be able to assist you with questions regarding bills from these companies.  You will be contacted with the lab results as soon as they are available. The fastest way to get your results is to activate your My Chart account. Instructions are located on the last page of this paperwork. If you have not heard from [place] regarding the results in 2 weeks, please contact this office.

## 2016-09-04 NOTE — Progress Notes (Signed)
[facility] [address] [place] [address] [phone]  Date:  09/04/2016   Name:  Deanna [name]   DOB:  01/02/1966   MRN:  [id number]  PCP:  Lamar Blinks, MD   Chief Complaint  Patient presents with  . Cough    Productive  . Shortness of Breath    History of Present Illness:  Deanna [name] is a 50 y.o. female patient who presents to [facility] for cc of Cough and dyspnea. Patient states that for 1 week she has had a gradually worsening cough. It is productive of a dark green sputum. She displays no fever. She does have mild congestion. She feels winded with doing activities but has no shortness of breath. At times she can feel like she can't take a deep breath. There was mild nausea that has improved. She has taken Mucinex DM for her symptoms. She also complains of fatigue that has been going on for weeks at this time. She denies any chest pain, palpitations, shortness of breath, or diaphoresis. She is sleeping. She denies any thyroid issues. She has no joint pain or swelling. There is no abdominal pain. No diarrhea or constipation. 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 She is not sexually active at this time. Sexually active 2 years ago with husband. She is now divorced after infidelity. She had never had any routine STD work following the separation. She also recalls having a tick bite a few months ago. She does not recall what actually occurred at that time. She denies any neck stiffness.   Patient Active Problem List   Diagnosis Date Noted  . Spinal stenosis of lumbar region 09/22/2015  . Vertigo, intermittent 01/09/2015  . Hx of adenomatous polyp of colon 09/21/2014  . Restless legs syndrome (RLS) 05/18/2014  . Platelet dysfunction ([place]) 05/16/2014  . Screening for breast cancer 05/11/2014  . Fatigue 10/15/2013  . Bleeding disorder ([place]) 08/31/2013  . Occipital neuralgia  07/19/2013  . Paresthesias 07/19/2013  . Hematuria 07/19/2013  . Family history of colon cancer requiring screening colonoscopy 04/12/2013  . Bleeding gums 04/12/2013  . Breast cancer screening 04/12/2013  . Other specified periodontal diseases 04/12/2013  . Other malaise and fatigue 04/12/2013  . Anemia, unspecified 04/12/2013  . Unspecified vitamin D deficiency 04/12/2013  . Palpitations 03/09/2013  . Bleeding 03/09/2013  . Thyroid disease   . Dysplastic nevus     Past Medical History:  Diagnosis Date  . Bleeding disorder ([place]) 08/31/2013  . Bleeding gums   . Clotting disorder ([place])   . Dysplastic nevus    followed yearly by [facility].  Marland Kitchen Hx of adenomatous polyp of colon 09/21/2014  . Hx of transfusion of packed red blood cells   . Restless legs syndrome (RLS) 05/18/2014  . Spinal stenosis of lumbar region 09/22/2015  . Thyroid disease    Post-partum  . Vertigo, intermittent 01/09/2015    Past Surgical History:  Procedure Laterality Date  . BREAST SURGERY Left 2007   removal of a severely dysplastic nevus vs. melanoma  . NASAL FRACTURE SURGERY  1995   post-op hemorrhage  . PARTIAL HYSTERECTOMY  11/04/2004   DUB/adenomyosis. Ovaries intact.  [name]  . resection dysplastic nevus  2012    Social History  Substance Use Topics  . Smoking status: Never Smoker  . Smokeless tobacco: Never Used  . Alcohol use No  Family History  Problem Relation Age of Onset  . Colon cancer Mother 22  . Diabetes Mother   . Hypertension Mother   . Cancer Mother 6    colon cancer  . Dementia Mother   . Stroke Father 63  . Hypertension Brother   . Heart disease Brother   . Thyroid disease Sister   . Colon cancer Sister 33  . Cancer Sister 27    colon cancer  . COPD Sister   . Heart disease Sister 39    AMI  . Cancer Brother 68    soft tissue cancer in throat/neck  . Cancer Brother 47    bone cancer  . Heart disease Brother     multiple AMI    No Known  Allergies  Medication list has been reviewed and updated.  Current Outpatient Prescriptions on File Prior to Visit  Medication Sig Dispense Refill  . escitalopram (LEXAPRO) 10 MG tablet TAKE 1 TABLET BY MOUTH ONCE DAILY 60 tablet PRN   No current facility-administered medications on file prior to visit.     ROS ROS otherwise unremarkable unless listed above.  Physical Examination: BP 110/76   Pulse 65   Temp 98.3 F (36.8 C) (Oral)   Resp 18   Ht 5' 4.25" (1.632 m)   Wt 186 lb (84.4 kg)   SpO2 99%   BMI 31.68 kg/m  Ideal Body Weight: Weight in (lb) to have BMI = 25: 146.5  Physical Exam  Constitutional: She is oriented to person, place, and time. She appears well-developed and well-nourished. No distress.  HENT:  Head: Normocephalic and atraumatic.  Right Ear: Tympanic membrane, external ear and ear canal normal.  Left Ear: Tympanic membrane, external ear and ear canal normal.  Nose: Mucosal edema and rhinorrhea present. Right sinus exhibits no maxillary sinus tenderness and no frontal sinus tenderness. Left sinus exhibits no maxillary sinus tenderness and no frontal sinus tenderness.  Mouth/Throat: No uvula swelling. No oropharyngeal exudate, posterior oropharyngeal edema or posterior oropharyngeal erythema.  Eyes: Conjunctivae and EOM are normal. Pupils are equal, round, and reactive to light.  Cardiovascular: Normal rate and regular rhythm.  Exam reveals no gallop, no distant heart sounds and no friction rub.   No murmur heard. Pulmonary/Chest: Effort normal. No respiratory distress. She has no decreased breath sounds. She has no wheezes. She has no rhonchi.  Lymphadenopathy:       Head (right side): No submandibular, no tonsillar, no preauricular and no posterior auricular adenopathy present.       Head (left side): No submandibular, no tonsillar, no preauricular and no posterior auricular adenopathy present.    She has no cervical adenopathy.  Neurological: She is  alert and oriented to person, place, and time.  Skin: Skin is warm and dry. She is not diaphoretic.  Psychiatric: She has a normal mood and affect. Her behavior is normal.  Vitals reviewed.    Assessment and Plan: AMANADA ROOT is a 50 y.o. female who is here today for chief complaint of cough and congestion for 1 week, and separate fatigue for weeks. We'll obtain the following labs. I will update her HIV screening. This could be secondary to deconditioning. Her EKG was reviewed and unremarkable of any changes. We will treat her with azithromycin at this time. Given supportive treatment as well. Return to clinic as needed. Other fatigue - Plan: CBC, EKG 12-Lead, CBC with Differential/Platelet, COMPLETE METABOLIC PANEL WITH GFR, TSH, HIV antibody, Epstein-Barr virus VCA antibody panel, B.  Burgdorfi Antibodies, CANCELED: COMPLETE METABOLIC PANEL WITH GFR, CANCELED: TSH, CANCELED: HIV antibody, CANCELED: Epstein-Barr virus VCA antibody panel, CANCELED: B. burgdorfi antibodies   Ivar Drape, PA-C [facility] 09/05/2016 7:31 AM

## 2016-09-05 LAB — EPSTEIN-BARR VIRUS VCA ANTIBODY PANEL
EBV NA IGG: 131 U/mL — AB
EBV VCA IgG: 298 U/mL — ABNORMAL HIGH
EBV VCA IgM: <36 U/mL

## 2016-09-05 LAB — LYME AB/WESTERN BLOT REFLEX

## 2016-09-07 LAB — VITAMIN D 25 HYDROXY (VIT D DEFICIENCY, FRACTURES): VIT D 25 HYDROXY: 18 ng/mL — AB (ref 30–100)

## 2016-09-08 ENCOUNTER — Other Ambulatory Visit: Payer: Self-pay | Admitting: Physician Assistant

## 2016-09-08 MED ORDER — ERGOCALCIFEROL 1.25 MG (50000 UT) PO CAPS: 50000.0000 [IU] | ORAL_CAPSULE | ORAL | 0 refills | Status: DC

## 2016-09-09 MED FILL — VIT D2 1.25 MG (50,000 UNIT: 1.25 MG | 56 days supply | Qty: 8 | Fill #0

## 2016-09-12 ENCOUNTER — Encounter: Payer: Self-pay | Admitting: Physician Assistant

## 2016-09-18 ENCOUNTER — Encounter: Payer: Self-pay | Admitting: Family Medicine

## 2016-09-18 ENCOUNTER — Ambulatory Visit (INDEPENDENT_AMBULATORY_CARE_PROVIDER_SITE_OTHER): Payer: 59 | Admitting: Family Medicine

## 2016-09-18 VITALS — BP 124/80 | HR 79 | Temp 98.2°F | Resp 18 | Ht 64.25 in | Wt 189.0 lb

## 2016-09-18 DIAGNOSIS — R5383 Other fatigue: Secondary | ICD-10-CM | POA: Diagnosis not present

## 2016-09-18 DIAGNOSIS — E559 Vitamin D deficiency, unspecified: Secondary | ICD-10-CM

## 2016-09-18 DIAGNOSIS — R5382 Chronic fatigue, unspecified: Secondary | ICD-10-CM | POA: Diagnosis not present

## 2016-09-18 DIAGNOSIS — R921 Mammographic calcification found on diagnostic imaging of breast: Secondary | ICD-10-CM | POA: Diagnosis not present

## 2016-09-18 NOTE — Patient Instructions (Addendum)
IF you received an x-ray today, you will receive an invoice from [facility]. Please contact [facility] at [phone] with questions or concerns regarding your invoice.   IF you received labwork today, you will receive an invoice from [employer]. Please contact [facility] at [phone] with questions or concerns regarding your invoice.   Our billing staff will not be able to assist you with questions regarding bills from these companies.  You will be contacted with the lab results as soon as they are available. The fastest way to get your results is to activate your My Chart account. Instructions are located on the last page of this paperwork. If you have not heard from [place] regarding the results in 2 weeks, please contact this office.     Chronic Fatigue Syndrome Chronic fatigue syndrome (CFS) is a condition that causes extreme tiredness (fatigue). This fatigue does not improve with rest, and it gets worse with physical or mental activity. You may have several other symptoms along with fatigue. Symptoms may come and go, but they generally last for months. Sometimes, CFS gets better over time, but it can be a lifelong condition. There is no cure, but there are many possible treatments. You will need to work with your health care providers to find a treatment plan that works best for you. What are the causes? The cause of CFS is not known. There may be more than one cause. Possible causes include:  An infection.  An abnormal body defense system (immune system).  Low blood pressure.  Poor diet.  Physical or emotional stress. What increases the risk? You are more likely to develop this condition if:  You are female.  You are 48?48 years old.  You have a family history of CFS.  You live with a lot of emotional stress. What are the signs or symptoms? The main symptom of CFS is fatigue that is severe enough to interfere with  day-to-day activities. This fatigue does not get better with rest, and it gets worse with physical or mental activity. There are eight other major symptoms of CFS:  Lack of energy (malaise) that lasts more than 24 hours after physical exertion.  Sleep that does not relieve fatigue (unrefreshing sleep).  Short-term memory loss or confusion.  Joint pain without redness or swelling.  Muscle aches.  Headaches.  Painful and swollen glands (lymph nodes) in the neck or under the arms.  Sore throat. You may also have:  Abdominal cramps, constipation, or diarrhea (irritable bowel).  Chills.  Night sweats.  Vision changes.  Dizziness.  Mental confusion (brain fog).  Clumsiness.  Sensitivity to food, noise, or odors.  Mood swings, depression, or anxiety attacks. How is this diagnosed? There are no tests that can diagnose this condition. Your health care provider will make the diagnosis based on your medical history, a physical exam, and a mental health exam. However, it is important to make sure that your symptoms are not caused by another medical condition. You may have lab tests or X-rays to rule out other conditions. For your health care provider to diagnose CFS:  You must have had fatigue for at least 6 straight months.  Fatigue must be your first symptom, and it must be severe enough to interfere with day-to-day activities.  There must be no other cause found for the fatigue.  You must also have at least four of the eight other major symptoms of CFS. How is this treated? There is no  cure for CFS. The condition affects everyone differently. You will need to work with your team of health care providers to find the best treatments for your symptoms. Your team may include your primary care provider, physical and exercise therapists, and mental health therapists. Treatment may include:  Improving sleep with a regular bedtime routine.  Avoiding caffeine, alcohol, and  tobacco.  Doing light exercise and stretching during the day.  Taking medicines to help you sleep or to relieve joint or muscle pain.  Learning and practicing relaxation techniques.  Using memory aids or doing brainteasers to improve memory and concentration.  Seeing a mental health therapist to evaluate and treat depression, if necessary.  Trying massage therapy, acupuncture, and movement exercises, such as yoga or tai chi. Follow these instructions at home:   Activity  Exercise regularly, as told by your health care provider.  Avoid fatigue by pacing yourself during the day and getting enough sleep at night.  Go to bed and get up at the same time every day. Eating and drinking  Avoid caffeine and alcohol.  Avoid heavy meals in the evening.  Eat a well-balanced diet. General instructions  Take over-the-counter and prescription medicines only as told by your health care provider.  Do not use herbal or dietary supplements unless they are approved by your health care provider.  Maintain a healthy weight.  Avoid stress and use stress-reducing techniques that you learn in therapy.  Do not use any products that contain nicotine or tobacco, such as cigarettes and e-cigarettes. If you need help quitting, ask your health care provider.  Consider joining a CFS support group.  Keep all follow-up visits as told by your health care provider. This is important. Contact a health care provider if:  Your symptoms do not get better or they get worse.  You feel angry, guilty, anxious, or depressed. This information is not intended to replace advice given to you by your health care provider. Make sure you discuss any questions you have with your health care provider. Document Released: 11/28/2004 Document Revised: 06/27/2016 Document Reviewed: 01/29/2016 Elsevier Interactive Patient Education  2017 [employer].

## 2016-09-18 NOTE — Progress Notes (Signed)
Chief Complaint  Patient presents with  . Follow-up    FATIGUE/CONGESTION    HPI  She reports that she has felt run down and has felt worse this week than she has in the past weeks She reports that today she feels flushed and her ears are ringing. 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 She reports that the past 2 years she has gone through a divorce and lots of stress. She takes a 1/2 lexapro which helps.   She reports that the respiratory symptoms of congestion got better but she has not had a return of energy. She reports that she has a chronic fatigue.   She denies any report of snoring or apnea. She reports that if she sits down.  Breast calcifications Discussed that she has not had her MRI since 2015 Her mammogram in 2016 was normal    Past Medical History:  Diagnosis Date  . Bleeding disorder ([place]) 08/31/2013  . Bleeding gums   . Clotting disorder ([place])   . Dysplastic nevus    followed yearly by [facility].  Marland Kitchen Hx of adenomatous polyp of colon 09/21/2014  . Hx of transfusion of packed red blood cells   . Restless legs syndrome (RLS) 05/18/2014  . Spinal stenosis of lumbar region 09/22/2015  . Thyroid disease    Post-partum  . Vertigo, intermittent 01/09/2015    Current Outpatient Prescriptions  Medication Sig Dispense Refill  . ergocalciferol (VITAMIN D2) 50000 units capsule Take 1 capsule (50,000 Units total) by mouth once a week. 8 capsule 0  . escitalopram (LEXAPRO) 10 MG tablet TAKE 1 TABLET BY MOUTH ONCE DAILY 60 tablet PRN   No current facility-administered medications for this visit.     Allergies: No Known Allergies  Past Surgical History:  Procedure Laterality Date  . BREAST SURGERY Left 2007   removal of a severely dysplastic nevus vs. melanoma  . NASAL FRACTURE SURGERY  1995   post-op hemorrhage  . PARTIAL HYSTERECTOMY  11/04/2004   DUB/adenomyosis. Ovaries intact.  Deanna [name].  .  resection dysplastic nevus  2012    Social History   Social History  . Marital status: Married    Spouse name: N/A  . Number of children: 2  . Years of education: AS   Occupational History  .  [employer] manager for hospitalist   Social History Main Topics  . Smoking status: Never Smoker  . Smokeless tobacco: Never Used  . Alcohol use No  . Drug use: No  . Sexual activity: Yes    Birth control/ protection: None   Other Topics Concern  . None   Social History Narrative   Marital status: married x 23 years; happily married; no abuse.      Children: 2 children (19, 17); no grandchildren.      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      Tobacco:  None      Alcohol: none      Drugs: none      Exercise:  Walking, strength training daily.      Seatbelt: 100%      Guns: none       Sunscreen:  SPF 30.   Patient is right handed.   Patient drinks 2 cups caffeine daily.    ROS See hpi  Objective: Vitals:   09/18/16 1609  BP: 124/80  Pulse: 79  Resp: 18  Temp: 98.2  F (36.8 C)  TempSrc: Oral  SpO2: 100%  Weight: 189 lb (85.7 kg)  Height: 5' 4.25" (1.632 m)    Physical Exam  Constitutional: She is oriented to person, place, and time. She appears well-developed and well-nourished.  HENT:  Head: Normocephalic and atraumatic.  Cardiovascular: Normal rate, regular rhythm and normal heart sounds.   Pulmonary/Chest: Effort normal and breath sounds normal. No respiratory distress. She has no wheezes.  Neurological: She is alert and oriented to person, place, and time.      Assessment and Plan Edythe was seen today for follow-up.  Diagnoses and all orders for this visit:  Other fatigue Chronic fatigue -     Ambulatory referral to Sleep Studies  Reviewed all lab results with the patients Advised pt to follow up with sleep studies to rule out  Discussed that her fatigue could be somatization if all work up is  negative Discussed that hypnosis and guided imagery helps with rest, fatigue and burnout  Breast calcifications on mammogram- reviewed previous MRI done in 2015 Recommendation  -     [place]; Future  Vitamin D insufficiency -    Discussed taking Vitamin D supplementation and recheck in 3 months  A total of 40 minutes were spent face-to-face with the patient during this encounter and over half of that time was spent on counseling and coordination of care.   [place]

## 2016-09-19 ENCOUNTER — Ambulatory Visit: Payer: 59 | Admitting: Neurology

## 2016-10-01 ENCOUNTER — Telehealth: Payer: Self-pay

## 2016-10-01 NOTE — Telephone Encounter (Signed)
Pt called to discuss scheduling MRI breast bilateral. Wanted to know if it should be entered as a screening as she needs to have it completed each year. I told her to go ahead and schedule it as is and that we can change the order if need be. Please advise.

## 2016-10-11 ENCOUNTER — Ambulatory Visit (INDEPENDENT_AMBULATORY_CARE_PROVIDER_SITE_OTHER): Payer: 59 | Admitting: Neurology

## 2016-10-11 ENCOUNTER — Encounter: Payer: Self-pay | Admitting: Neurology

## 2016-10-11 ENCOUNTER — Ambulatory Visit (HOSPITAL_COMMUNITY)
Admission: RE | Admit: 2016-10-11 | Discharge: 2016-10-11 | Disposition: A | Discharge status: Home / Self Care | Payer: 59 | Source: Ambulatory Visit | Attending: Family Medicine | Admitting: Family Medicine

## 2016-10-11 VITALS — BP 107/70 | HR 66 | Ht 64.25 in | Wt 190.0 lb

## 2016-10-11 DIAGNOSIS — M48061 Spinal stenosis, lumbar region without neurogenic claudication: Secondary | ICD-10-CM | POA: Diagnosis not present

## 2016-10-11 DIAGNOSIS — N6320 Unspecified lump in the left breast, unspecified quadrant: Secondary | ICD-10-CM | POA: Insufficient documentation

## 2016-10-11 DIAGNOSIS — G2581 Restless legs syndrome: Secondary | ICD-10-CM

## 2016-10-11 DIAGNOSIS — N6310 Unspecified lump in the right breast, unspecified quadrant: Secondary | ICD-10-CM | POA: Insufficient documentation

## 2016-10-11 DIAGNOSIS — N6311 Unspecified lump in the right breast, upper outer quadrant: Secondary | ICD-10-CM | POA: Diagnosis not present

## 2016-10-11 DIAGNOSIS — R921 Mammographic calcification found on diagnostic imaging of breast: Secondary | ICD-10-CM | POA: Diagnosis present

## 2016-10-11 MED ORDER — GADOBENATE DIMEGLUMINE 529 MG/ML IV SOLN
18.0000 mL | Freq: Once | INTRAVENOUS | Status: AC | PRN
  Administered 2016-10-11: 18 mL via INTRAVENOUS

## 2016-10-11 NOTE — Progress Notes (Signed)
Reason for visit: Lumbosacral spinal stenosis  Deanna [name] is an 50 y.o. female  History of present illness:  Deanna [name] is a 50 year old right-handed white female with a history of low back pain and leg discomfort associated with a L4-5 spinal stenosis that is of moderate to significant severity. The patient indicates that she has some pain across the low back, occasionally the pain may go down the back of the legs. The patient has increased discomfort in the lower legs and feet with prolonged ambulation. Standing too long will also bother her. She has noted good improvement with epidural steroid injections, the last injection was in June 2017. The patient has not wanted to go on a medication for the low back pain, she has not wished to consider surgery. The patient reports a significant issue with fatigue and excessive daytime drowsiness. She will be undergoing a sleep evaluation in the near future. The patient has restless leg syndrome, she is on Lexapro which may exacerbate periodic limb movements at night. The patient returns to this office for an evaluation.  Past Medical History:  Diagnosis Date  . Bleeding disorder ([place]) 08/31/2013  . Bleeding gums   . Clotting disorder ([place])   . Dysplastic nevus    followed yearly by [facility].  Marland Kitchen Hx of adenomatous polyp of colon 09/21/2014  . Hx of transfusion of packed red blood cells   . Restless legs syndrome (RLS) 05/18/2014  . Spinal stenosis of lumbar region 09/22/2015  . Thyroid disease    Post-partum  . Vertigo, intermittent 01/09/2015    Past Surgical History:  Procedure Laterality Date  . BREAST SURGERY Left 2007   removal of a severely dysplastic nevus vs. melanoma  . NASAL FRACTURE SURGERY  1995   post-op hemorrhage  . PARTIAL HYSTERECTOMY  11/04/2004   DUB/adenomyosis. Ovaries intact.  [name]  . resection dysplastic nevus  2012    Family History  Problem Relation Age of Onset  . Colon cancer Mother 93    . Diabetes Mother   . Hypertension Mother   . Cancer Mother 69    colon cancer  . Dementia Mother   . Stroke Father 75  . Hypertension Brother   . Heart disease Brother   . Thyroid disease Sister   . Colon cancer Sister 39  . Cancer Sister 7    colon cancer  . COPD Sister   . Heart disease Sister 53    AMI  . Cancer Brother 68    soft tissue cancer in throat/neck  . Cancer Brother 29    bone cancer  . Heart disease Brother     multiple AMI    Social history:  reports that she has never smoked. She has never used smokeless tobacco. She reports that she does not drink alcohol or use drugs.   No Known Allergies  Medications:  Prior to Admission medications   Medication Sig Start Date End Date Taking? Authorizing Provider  ergocalciferol (VITAMIN D2) 50000 units capsule Take 1 capsule (50,000 Units total) by mouth once a week. 09/08/16   Dorian Heckle English, PA  escitalopram (LEXAPRO) 10 MG tablet TAKE 1 TABLET BY MOUTH ONCE DAILY 07/01/16   Darreld Mclean, MD    ROS:  Out of a complete 14 system review of symptoms, the patient complains only of the following symptoms, and all other reviewed systems are negative.  Fatigue Constipation Daytime sleepiness Back pain Bruising easily Numbness  Blood pressure 107/70, pulse 66,  height 5' 4.25" (1.632 m), weight 190 lb (86.2 kg).  Physical Exam  General: The patient is alert and cooperative at the time of the examination.  Neuromuscular: Range of movement of the low back is full.  Skin: No significant peripheral edema is noted.   Neurologic Exam  Mental status: The patient is alert and oriented x 3 at the time of the examination. The patient has apparent normal recent and remote memory, with an apparently normal attention span and concentration ability.   Cranial nerves: Facial symmetry is present. Speech is normal, no aphasia or dysarthria is noted. Extraocular movements are full. Visual fields are  full.  Motor: The patient has good strength in all 4 extremities.  Sensory examination: Soft touch sensation is symmetric on the face, arms, and legs.  Coordination: The patient has good finger-nose-finger and heel-to-shin bilaterally.  Gait and station: The patient has a normal gait. Tandem gait is normal. Romberg is negative. No drift is seen.  Reflexes: Deep tendon reflexes are symmetric.   MRI lumbar 05/12/15:  IMPRESSION:  Abnormal MRI lumbar spine (without) demonstrating: 1. At L4-5: disc bulging and facet / ligamentum flavum hypertrophy with moderate-severe spinal stenosis and mild biforaminal stenosis  2. At L5-S1: disc bulging and facet hypertrophy with moderate right and mild-moderate left foraminal stenosis   * MRI scan images were reviewed online. I agree with the written report.    Assessment/Plan:  1. Lumbosacral spinal stenosis, L4-5 level  2. Restless leg syndrome  3. Excessive daytime drowsiness  The patient will be set up for another epidural steroid injection, she believes that she gained benefit with this. The patient has excessive daytime drowsiness, she recently was diagnosed with significant vitamin D deficiency. She is on supplementation at this time. The daytime drowsiness may potentially be related to a periodic limb movement disorder associated with the restless leg syndrome and the use of Lexapro. The patient will follow-up through this office in 6 months.  Jill Alexanders MD 10/11/2016 9:45 AM  [facility] [address] [place], [place] [address]  Phone [phone] Fax [phone]

## 2016-10-14 ENCOUNTER — Other Ambulatory Visit: Payer: Self-pay | Admitting: Neurology

## 2016-10-14 DIAGNOSIS — M4800 Spinal stenosis, site unspecified: Secondary | ICD-10-CM

## 2016-10-17 ENCOUNTER — Encounter: Payer: Self-pay | Admitting: Family Medicine

## 2016-10-18 ENCOUNTER — Telehealth: Payer: Self-pay

## 2016-10-18 ENCOUNTER — Telehealth: Payer: Self-pay | Admitting: *Deleted

## 2016-10-18 NOTE — Telephone Encounter (Signed)
Spoke with pt - she has called [facility] and results of breast MRI will not be released until she has mammogram.  It has been a week since her MRI and she is anxious.  Pt states she is supposed to have MRI yearly and mammograms q/2 years.  She asked that we call and discuss with [facility].  Chelle called in the absence of Dr. Nolon Rod and got the same information.  This is the recommendations of ACS as stated on her 05/25/2015 diagnostic mammogram.   " recommends annual SCREENING mammograms"      Pt will call and schedule her own screening Mammogram.  If she has any difficulty, suggested she ask them to review her 05/25/2015 report and verify it states SCREENING.  She will call with any questions.

## 2016-10-18 NOTE — Telephone Encounter (Signed)
[facility] Called Stating that they will not do an MRI until patient has a mammogram done. Patient will need refferal to have mammogram.

## 2016-10-21 ENCOUNTER — Encounter: Payer: Self-pay | Admitting: Neurology

## 2016-10-21 ENCOUNTER — Other Ambulatory Visit: Payer: Self-pay | Admitting: Family Medicine

## 2016-10-21 ENCOUNTER — Ambulatory Visit (INDEPENDENT_AMBULATORY_CARE_PROVIDER_SITE_OTHER): Payer: 59 | Admitting: Neurology

## 2016-10-21 VITALS — BP 113/71 | HR 68 | Resp 20 | Ht 64.0 in | Wt 192.0 lb

## 2016-10-21 DIAGNOSIS — G2581 Restless legs syndrome: Secondary | ICD-10-CM

## 2016-10-21 DIAGNOSIS — Z1231 Encounter for screening mammogram for malignant neoplasm of breast: Secondary | ICD-10-CM

## 2016-10-21 DIAGNOSIS — R0683 Snoring: Secondary | ICD-10-CM

## 2016-10-21 DIAGNOSIS — R519 Headache, unspecified: Secondary | ICD-10-CM

## 2016-10-21 DIAGNOSIS — R51 Headache: Secondary | ICD-10-CM

## 2016-10-21 DIAGNOSIS — E669 Obesity, unspecified: Secondary | ICD-10-CM

## 2016-10-21 DIAGNOSIS — R921 Mammographic calcification found on diagnostic imaging of breast: Secondary | ICD-10-CM

## 2016-10-21 DIAGNOSIS — R400 Somnolence: Secondary | ICD-10-CM | POA: Diagnosis not present

## 2016-10-21 DIAGNOSIS — G4769 Other sleep related movement disorders: Secondary | ICD-10-CM | POA: Diagnosis not present

## 2016-10-21 NOTE — Patient Instructions (Signed)
Based on your symptoms and your exam I believe you are at risk for obstructive sleep apnea or OSA, and I think we should proceed with a sleep study to determine whether you do or do not have OSA and how severe it is. If you have more than mild OSA, I want you to consider treatment with CPAP. Please remember, the risks and ramifications of moderate to severe obstructive sleep apnea or OSA are: Cardiovascular disease, including congestive heart failure, stroke, difficult to control hypertension, arrhythmias, and even type 2 diabetes has been linked to untreated OSA. Sleep apnea causes disruption of sleep and sleep deprivation in most cases, which, in turn, can cause recurrent headaches, problems with memory, mood, concentration, focus, and vigilance. Most people with untreated sleep apnea report excessive daytime sleepiness, which can affect their ability to drive. Please do not drive if you feel sleepy.   You have restless legs symptoms too, we will be on the lookout for leg movements in sleep. We will pick up our discussion about RLS and potential treatment options next time.   I will likely see you back after your sleep study to go over the test results and where to go from there. We will call you after your sleep study to advise about the results (most likely, you will hear from Beverlee Nims, my nurse) and to set up an appointment at the time, as necessary.    Our sleep lab administrative assistant, Arrie Aran will meet with you or call you to schedule your sleep study. If you don't hear back from her by next week please feel free to call her at [phone]. This is her direct line and please leave a message with your phone number to call back if you get the voicemail box. She will call back as soon as possible.

## 2016-10-21 NOTE — Progress Notes (Signed)
Subjective:    Deanna [name] ID: Deanna Deanna [name] is a 50 y.o. female.  HPI     Star Age, MD, PhD [facility] [address], [address] [place], [place]anna Deanna [name] [address]  Dear Dr. Nolon Rod,   I saw your Deanna [name], Deanna Deanna [name], upon your kind request in my neurologic clinic today for initial consultation of her sleep disorder, in particular, concern for underlying obstructive sleep apnea. Deanna Deanna [name] is unaccompanied today. As you know, Deanna Deanna [name] is a 50 year old right-handed woman with an underlying medical history of restless leg syndrome, lumbar spinal stenosis with back pain for which she has been seeing my colleague Dr. Jannifer Franklin, intermittent vertigo, and obesity, who reports snoring, excessive daytime somnolence and morning headaches. Her Epworth sleepiness score is 13 out of 24 today, her fatigue score is 53 out of 63. She does not wake up rested. I reviewed your office note from 09/18/2016. She has a Bedtime of around 11 PM, wakeup time is around 6. She does not wake up rested. She has occasional morning headaches which are dull, achy, non-migrainous. She has a brother with obstructive sleep apnea who uses a CPAP machine. Her mother had a sleep study and was told she has restless leg syndrome. Deanna [name] herself has restless leg symptoms and a tendency to move her feet before falling asleep has been told that she jerks her legs in sleep. She lives alone. She is divorced, she has 2 grown kids, both in college. She is a nonsmoker, drinks alcohol infrequently, about once a week and caffeine in Deanna form of coffee, 2 cups per day and occasional sodas, typically no tea. She has been on Lexapro low-dose for Deanna past year for situational anxiety which has helped. However, in Deanna past year she has gained about 20 pounds. She denies any night sweats night nocturia. On weekends she tries to sleep in longer but does not necessarily wake up better rested. Her peak his sleep-related  complaint is her nonrestorative sleep and daytime sleepiness. She works full-time. She has been on prescription strength vitamin D but otherwise does not take any other prescription medications. She has had epidural steroid injection for low back pain which have helped.   Her Past Medical History Is Significant For: Past Medical History:  Diagnosis Date  . Bleeding disorder ([place]) 08/31/2013  . Bleeding gums   . Clotting disorder ([place])   . Dysplastic nevus    followed [facility] by [facility].  Marland Kitchen Hx of adenomatous polyp of colon 09/21/2014  . Hx of transfusion of packed red blood cells   . Restless legs syndrome (RLS) 05/18/2014  . Spinal stenosis of lumbar region 09/22/2015  . Thyroid disease    Post-partum  . Vertigo, intermittent 01/09/2015    Her Past Surgical History Is Significant For: Past Surgical History:  Procedure Laterality Date  . BREAST SURGERY Left 2007   removal of a severely dysplastic nevus vs. melanoma  . NASAL FRACTURE SURGERY  1995   post-op hemorrhage  . PARTIAL HYSTERECTOMY  11/04/2004   DUB/adenomyosis. Ovaries intact.  [name]  . resection dysplastic nevus  2012    Her Family History Is Significant For: Family History  Problem Relation Age of Onset  . Colon cancer Mother 60  . Diabetes Mother   . Hypertension Mother   . Cancer Mother 44    colon cancer  . Dementia Mother   . Stroke Father 23  . Hypertension Brother   . Heart disease Brother   .  Thyroid disease Sister   . Colon cancer Sister 109  . Cancer Sister 84    colon cancer  . COPD Sister   . Heart disease Sister 39    AMI  . Cancer Brother 68    soft tissue cancer in throat/neck  . Cancer Brother 48    bone cancer  . Heart disease Brother     multiple AMI    Her Social History Is Significant For: Social History   Social History  . Marital status: Married    Spouse name: N/A  . Number of children: 2  . Years of education: AS   Occupational History  .  [employer] manager for hospitalist   Social History Main Topics  . Smoking status: Never Smoker  . Smokeless tobacco: Never Used  . Alcohol use No  . Drug use: No  . Sexual activity: Yes    Birth control/ protection: None   Other Topics Concern  . None   Social History Narrative   Marital status: married x 23 years; happily married; no abuse.      Children: 2 children (19, 17); no grandchildren.      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      Tobacco:  None      Alcohol: none      Drugs: none      Exercise:  Walking, strength training daily.      Seatbelt: 100%      Guns: none       Sunscreen:  SPF 30.   Deanna [name] is right handed.   Deanna [name] drinks 2 cups caffeine daily.    Her Allergies Are:  No Known Allergies:   Her Current Medications Are:  Outpatient Encounter Prescriptions as of 10/21/2016  Medication Sig  . ergocalciferol (VITAMIN D2) 50000 units capsule Take 1 capsule (50,000 Units total) by mouth once a week.  . escitalopram (LEXAPRO) 10 MG tablet TAKE 1 TABLET BY MOUTH ONCE DAILY   No facility-administered encounter medications on file as of 10/21/2016.   :  Review of Systems:  Out of a complete 14 point review of systems, all are reviewed and negative with Deanna exception of these symptoms as listed below:  Review of Systems  Neurological: Positive for weakness and numbness.       Pt presents today to discuss chronic fatigue. Pt never feels completely rested, even after having sufficient sleep. Pt has never had a sleep study. Pt is also waking up with frequent headaches.  Epworth Sleepiness Scale 0= would never doze 1= slight chance of dozing 2= moderate chance of dozing 3= high chance of dozing  Sitting and reading: 3 Watching TV: 3 Sitting inactive in a public place (ex. Theater or meeting): 1 As a passenger in a car for an hour without a break: 2 Lying down to rest in Deanna afternoon: 3 Sitting and talking to someone:0 Sitting  quietly after lunch (no alcohol): 1 In a car, while stopped in traffic: 0 Total: 13   Psychiatric/Behavioral: Positive for sleep disturbance.   Objective:  Neurologic Exam  Physical Exam Physical Examination:   Vitals:   10/21/16 0850  BP: 113/71  Pulse: 68  Resp: 20    General Examination: Deanna Deanna [name] is a very pleasant 50 y.o. female in no acute distress. She appears well-developed and well-nourished and well groomed.   HEENT: Normocephalic, atraumatic, pupils are equal, round and reactive to light and accommodation. Funduscopic exam is normal  with sharp disc margins noted. Extraocular tracking is good without limitation to gaze excursion or nystagmus noted. Normal smooth pursuit is noted. Hearing is grossly intact. Face is symmetric with normal facial animation and normal facial sensation. Speech is clear with no dysarthria noted. There is no hypophonia. There is no lip, neck/head, jaw or voice tremor. Neck is supple with full range of passive and active motion. There are no carotid bruits on auscultation. Oropharynx exam reveals: mild mouth dryness, adequate dental hygiene and mild airway crowding, due to smaller airway opening and redundant soft palate, uvula small/normal and tonsils are 1+ b/l. Mallampati is class II. Tongue protrudes centrally and palate elevates symmetrically. Neck size is 13.5 inches. She has a small overbite. Nasal inspection reveals no significant nasal mucosal bogginess or redness and no septal deviation.   Chest: Clear to auscultation without wheezing, rhonchi or crackles noted.  Heart: S1+S2+0, regular and normal without murmurs, rubs or gallops noted.   Abdomen: Soft, non-tender and non-distended with normal bowel sounds appreciated on auscultation.  Extremities: There is no pitting edema in Deanna distal lower extremities bilaterally. Pedal pulses are intact.  Skin: Warm and dry without trophic changes noted.   Musculoskeletal: exam reveals no obvious  joint deformities, tenderness or joint swelling or erythema.   Neurologically:  Mental status: Deanna Deanna [name] is awake, alert and oriented in all 4 spheres. Her immediate and remote memory, attention, language skills and fund of knowledge are appropriate. There is no evidence of aphasia, agnosia, apraxia or anomia. Speech is clear with normal prosody and enunciation. Thought process is linear. Mood is normal and affect is normal.  Cranial nerves II - XII are as described above under HEENT exam. In addition: shoulder shrug is normal with equal shoulder height noted. Motor exam: Normal bulk, strength and tone is noted. There is no drift, tremor or rebound. Romberg is negative. Reflexes are 2+ throughout. Babinski: Toes are flexor bilaterally. Fine motor skills and coordination: intact with normal finger taps, normal hand movements, normal rapid alternating patting, normal foot taps and normal foot agility.  Cerebellar testing: No dysmetria or intention tremor on finger to nose testing. Heel to shin is unremarkable bilaterally. There is no truncal or gait ataxia.  Sensory exam: intact to light touch, pinprick, vibration, temperature sense in Deanna UEs and LEs.   Gait, station and balance: She stands easily. No veering to one side is noted. No leaning to one side is noted. Posture is age-appropriate and stance is narrow based. Gait shows normal stride length and normal pace. No problems turning are noted. Tandem walk is unremarkable.  Assessment and Plan:   In summary, Deanna Deanna [name] is a very pleasant 50 y.o.-year old female with an underlying medical history of restless leg syndrome, lumbar spinal stenosis with back pain for which she has been seeing my colleague Dr. Jannifer Franklin, intermittent vertigo, and obesity, whose history and physical exam are concerning for obstructive sleep apnea (OSA), and that she reports weight gain, snoring, significant daytime somnolence and morning headaches as well as a family  history of OSA. In addition, she endorses restless leg symptoms and leg movements in her sleep, waking up with a sense of jerking and having to move her feet prior to falling asleep. We talked about potential treatment options for restless leg syndrome and PLMS. We will pick up our discussion after her sleep study. I had a long chat with Deanna Deanna [name] about my findings and Deanna diagnosis of OSA, its prognosis and treatment options.  We talked about medical treatments, surgical interventions and non-pharmacological approaches. I explained in particular Deanna risks and ramifications of untreated moderate to severe OSA, especially with respect to developing cardiovascular disease down Deanna [address], including congestive heart failure, difficult to treat hypertension, cardiac arrhythmias, or stroke. Even type 2 diabetes has, in part, been linked to untreated OSA. Symptoms of untreated OSA include daytime sleepiness, memory problems, mood irritability and mood disorder such as depression and anxiety, lack of energy, as well as recurrent headaches, especially morning headaches. We talked about trying to maintain a healthy lifestyle in general, as well as Deanna importance of weight control. I encouraged Deanna Deanna [name] to eat healthy, exercise daily and keep well hydrated, to keep a scheduled bedtime and wake time routine, to not skip any meals and eat healthy snacks in between meals. I advised Deanna Deanna [name] not to drive when feeling sleepy. I recommended Deanna following at this time: sleep study with potential positive airway pressure titration. (We will score hypopneas at 3% and split Deanna sleep study into diagnostic and treatment portion, if Deanna estimated. 2 hour AHI is >15/h).   I explained Deanna sleep test procedure to Deanna Deanna [name] and also outlined possible surgical and non-surgical treatment options of OSA, including Deanna use of a custom-made dental device (which would require a referral to a specialist dentist or oral surgeon), upper  airway surgical options, such as pillar implants, radiofrequency surgery, tongue base surgery, and UPPP (which would involve a referral to an ENT surgeon). Rarely, jaw surgery such as mandibular advancement may be considered.  I also explained Deanna CPAP treatment option to Deanna Deanna [name], who indicated that she would be willing to try CPAP if Deanna need arises. I explained Deanna importance of being compliant with PAP treatment, not only for insurance purposes but primarily to improve Her symptoms, and for Deanna Deanna [name]'s long term health benefit, including to reduce Her cardiovascular risks. I answered all her questions today and Deanna Deanna [name] was in agreement. I would like to see her back after Deanna sleep study is completed and encouraged her to call with any interim questions, concerns, problems or updates.   Thank you very much for allowing me to participate in Deanna care of this nice Deanna [name]. If I can be of any further assistance to you please do not hesitate to call me at [phone].  Sincerely,   Star Age, MD, PhD

## 2016-10-30 ENCOUNTER — Ambulatory Visit
Admission: RE | Admit: 2016-10-30 | Discharge: 2016-10-30 | Disposition: A | Discharge status: Home / Self Care | Payer: 59 | Source: Ambulatory Visit | Attending: Family Medicine | Admitting: Family Medicine

## 2016-10-30 ENCOUNTER — Other Ambulatory Visit: Payer: Self-pay | Admitting: Family Medicine

## 2016-10-30 DIAGNOSIS — R922 Inconclusive mammogram: Secondary | ICD-10-CM | POA: Diagnosis not present

## 2016-10-30 DIAGNOSIS — N644 Mastodynia: Secondary | ICD-10-CM

## 2016-10-30 DIAGNOSIS — N6489 Other specified disorders of breast: Secondary | ICD-10-CM | POA: Diagnosis not present

## 2016-10-30 DIAGNOSIS — R921 Mammographic calcification found on diagnostic imaging of breast: Secondary | ICD-10-CM

## 2016-11-06 MED FILL — ESCITALOPRAM 10 MG TABLET: 10 | 60 days supply | Qty: 60 | Fill #1

## 2016-12-06 ENCOUNTER — Ambulatory Visit
Admission: RE | Admit: 2016-12-06 | Discharge: 2016-12-06 | Disposition: A | Discharge status: Home / Self Care | Payer: 59 | Source: Ambulatory Visit | Attending: Neurology | Admitting: Neurology

## 2016-12-06 VITALS — BP 136/82 | HR 80

## 2016-12-06 DIAGNOSIS — M545 Low back pain: Secondary | ICD-10-CM | POA: Diagnosis not present

## 2016-12-06 DIAGNOSIS — M4800 Spinal stenosis, site unspecified: Secondary | ICD-10-CM

## 2016-12-06 DIAGNOSIS — M48061 Spinal stenosis, lumbar region without neurogenic claudication: Secondary | ICD-10-CM

## 2016-12-06 MED ORDER — METHYLPREDNISOLONE ACETATE 40 MG/ML INJ SUSP (RADIOLOG
120.0000 mg | Freq: Once | INTRAMUSCULAR | Status: AC
  Administered 2016-12-06: 120 mg via EPIDURAL

## 2016-12-06 MED ORDER — IOPAMIDOL (ISOVUE-M 200) INJECTION 41%
1.0000 mL | Freq: Once | INTRAMUSCULAR | Status: AC
  Administered 2016-12-06: 1 mL via EPIDURAL

## 2016-12-06 NOTE — Discharge Instructions (Signed)

## 2017-04-11 ENCOUNTER — Ambulatory Visit: Payer: 59 | Admitting: Neurology

## 2017-07-09 ENCOUNTER — Encounter: Payer: Self-pay | Admitting: Family Medicine

## 2017-07-09 ENCOUNTER — Ambulatory Visit (INDEPENDENT_AMBULATORY_CARE_PROVIDER_SITE_OTHER): Payer: 59 | Admitting: Family Medicine

## 2017-07-09 VITALS — BP 124/80 | HR 82 | Temp 98.2°F | Resp 18 | Ht 64.0 in | Wt 190.6 lb

## 2017-07-09 DIAGNOSIS — L237 Allergic contact dermatitis due to plants, except food: Secondary | ICD-10-CM | POA: Diagnosis not present

## 2017-07-09 MED ORDER — HYDROXYZINE PAMOATE 25 MG PO CAPS: 25.0000 mg | ORAL_CAPSULE | Freq: Three times a day (TID) | ORAL | 0 refills | Status: DC | PRN

## 2017-07-09 NOTE — Progress Notes (Signed)
9/5/20187:14 PM  Deanna [name] 03-Oct-1966, 51 y.o. female [id number]  Chief Complaint  Patient presents with  . Rash    on both legs started on saturday on just the back of knee and is now spreading     HPI:   Patient is a 51 y.o. female who presents today for itchy spreading rash on both legs that started about a week ago, a week after weeding. She has been treating with claritin and OTC hydrocortisone with partial relief during the day but minimal during nighttime.   Otherwise has no other concerns today.  Depression screen [facility] 2/9 07/09/2017 09/18/2016 09/04/2016  Decreased Interest 0 0 0  Down, Depressed, Hopeless 0 0 0  PHQ - 2 Score 0 0 0  Altered sleeping - - -  Tired, decreased energy - - -  Change in appetite - - -  Feeling bad or failure about yourself  - - -  Trouble concentrating - - -  Moving slowly or fidgety/restless - - -  Suicidal thoughts - - -  PHQ-9 Score - - -    No Known Allergies  Current Outpatient Prescriptions on File Prior to Visit  Medication Sig Dispense Refill  . ergocalciferol (VITAMIN D2) 50000 units capsule Take 1 capsule (50,000 Units total) by mouth once a week. (Patient not taking: Reported on 07/09/2017) 8 capsule 0  . escitalopram (LEXAPRO) 10 MG tablet TAKE 1 TABLET BY MOUTH ONCE DAILY (Patient not taking: Reported on 07/09/2017) 60 tablet PRN   No current facility-administered medications on file prior to visit.     Past Medical History:  Diagnosis Date  . Bleeding disorder ([place]) 08/31/2013  . Bleeding gums   . Clotting disorder ([place])   . Dysplastic nevus    followed yearly by [facility].  Marland Kitchen Hx of adenomatous polyp of colon 09/21/2014  . Hx of transfusion of packed red blood cells   . Restless legs syndrome (RLS) 05/18/2014  . Spinal stenosis of lumbar region 09/22/2015  . Thyroid disease    Post-partum  . Vertigo, intermittent 01/09/2015    Past Surgical History:  Procedure Laterality Date  . BREAST SURGERY  Left 2007   removal of a severely dysplastic nevus vs. melanoma  . NASAL FRACTURE SURGERY  1995   post-op hemorrhage  . PARTIAL HYSTERECTOMY  11/04/2004   DUB/adenomyosis. Ovaries intact.  [name]  . resection dysplastic nevus  2012    Social History  Substance Use Topics  . Smoking status: Never Smoker  . Smokeless tobacco: Never Used  . Alcohol use No    Family History  Problem Relation Age of Onset  . Colon cancer Mother 53  . Diabetes Mother   . Hypertension Mother   . Cancer Mother 50       colon cancer  . Dementia Mother   . Stroke Father 25  . Hypertension Brother   . Heart disease Brother   . Thyroid disease Sister   . Colon cancer Sister 65  . Cancer Sister 73       colon cancer  . COPD Sister   . Heart disease Sister 69       AMI  . Cancer Brother 68       soft tissue cancer in throat/neck  . Cancer Brother 55       bone cancer  . Heart disease Brother        multiple AMI    Review of Systems  Constitutional: Negative for chills and fever.  Respiratory: Negative for shortness of breath and wheezing.   Skin: Positive for itching and rash.     OBJECTIVE:  Blood pressure 124/80, pulse 82, temperature 98.2 F (36.8 C), temperature source Oral, resp. rate 18, height 5\' 4"  (1.626 m), weight 190 lb 9.6 oz (86.5 kg), SpO2 97 %.  Physical Exam  Constitutional: She is well-developed, well-nourished, and in no distress.  Pulmonary/Chest: Effort normal.  Skin:  Bilateral lower extremities with scattered erythematous blisters in multiple configurations including linear      ASSESSMENT and PLAN:  1. Poison oak dermatitis Discussed natural course of rash and symptoms and continuing with supportive measures. Rx hydroxyzine for bedtime. Patient educational handout given. RTC precautions discussed.       Rutherford Guys, MD [facility] [place], [place] [address] Ph.  [phone] Fax [phone]

## 2017-07-09 NOTE — Patient Instructions (Addendum)
IF you received an x-ray today, you will receive an invoice from [facility]. Please contact [facility] at [phone] with questions or concerns regarding your invoice.   IF you received labwork today, you will receive an invoice from [facility]. Please contact [facility] at [phone] with questions or concerns regarding your invoice.   Our billing staff will not be able to assist you with questions regarding bills from these companies.  You will be contacted with the lab results as soon as they are available. The fastest way to get your results is to activate your My Chart account. Instructions are located on the last page of this paperwork. If you have not heard from [place] regarding the results in 2 weeks, please contact this office.     Poison Oak Dermatitis Poison oak dermatitis is inflammation of the skin that is caused by contact with the allergens on the leaves of the poison oak (toxicodendron) plant. The skin reaction often includes redness, swelling, blisters, and extreme itching. What are the causes? This condition is caused by a specific chemical (urushiol) that is found in the sap of the poison oak plant. This chemical is sticky and it can be easily spread to people, animals, and objects. You can get poison oak dermatitis by:  Having direct contact with a poison oak plant.  Touching animals, other people, or objects that have come in contact with poison oak and have the chemical on them.  What increases the risk? This condition is more likely to develop in people who:  Are outdoors often.  Go outdoors without wearing protective clothing, such as closed shoes, long pants, and a long-sleeved shirt.  What are the signs or symptoms? Symptoms of this condition include:  Redness of the skin.  A rash that may develop blisters.  Extreme itching.  Swelling. This may occur if the reaction is more severe.  Symptoms usually last for 1-2 weeks. However,  the first time you develop this condition, symptoms may last 3-4 weeks. How is this diagnosed? This condition may be diagnosed based on your symptoms and a physical exam. Your health care provider may also ask you about any recent outdoor activity. How is this treated? Treatment for this condition will vary depending on how severe it is. Treatment may include:  Hydrocortisone creams or calamine lotions to relieve itching.  Oatmeal baths to soothe the skin.  Over-the-counter antihistamine tablets.  Oral steroid medicine for more severe outbreaks.  Follow these instructions at home:  Take or apply over-the-counter and prescription medicines only as told by your health care provider.  Wash exposed skin as soon as possible with soap and cold water.  Use hydrocortisone creams or calamine lotion as needed to soothe the skin and relieve itching.  Take oatmeal baths as needed. Use colloidal oatmeal. You can get this at your local pharmacy or grocery store. Follow the instructions on the packaging.  Do not scratch or rub your skin.  While you have the rash, wash clothes right after you wear them. How is this prevented?  Learn to identify the poison oak plant and avoid contact with the plant. This plant can be recognized by the number of leaves. Generally, poison oak has three leaves with flowering branches on a single stem. The leaves are often a bit fuzzy and have a toothlike edge.  If you have been exposed to poison oak, thoroughly wash with soap and water right away. You have about 30 minutes to remove the plant resin  before it will cause the rash. Be sure to wash under your fingernails because any plant resin there will continue to spread the rash.  When hiking or camping, wear clothes that will help you avoid exposure on the skin. This includes long pants, a long-sleeved shirt, tall socks, and hiking boots. You can also apply preventive lotion to your skin to help limit exposure.  If  you suspect that your clothes or outdoor gear came in contact with poison oak, rinse them off outside with a garden hose before bringing them inside your house. Contact a health care provider if:  You have open sores in the rash area.  You have more redness, swelling, or pain in the affected area.  You have redness that spreads beyond the rash area.  You have fluid, blood, or pus coming from the affected area.  You have a fever.  You have a rash over a large area of your body.  You have a rash on your eyes, mouth, or genitals.  Your rash does not improve after a few days. Get help right away if:  Your face swells or your eyes swell shut.  You have trouble breathing.  You have trouble swallowing. This information is not intended to replace advice given to you by your health care provider. Make sure you discuss any questions you have with your health care provider. Document Released: 04/27/2003 Document Revised: 03/28/2016 Document Reviewed: 03/29/2015 Elsevier Interactive Patient Education  [employer].

## 2017-07-10 MED FILL — HYDROXYZINE PAM 25 MG CAP: 25 | 10 days supply | Qty: 30 | Fill #0

## 2017-08-20 DIAGNOSIS — Z113 Encounter for screening for infections with a predominantly sexual mode of transmission: Secondary | ICD-10-CM | POA: Diagnosis not present

## 2017-08-20 DIAGNOSIS — R102 Pelvic and perineal pain: Secondary | ICD-10-CM | POA: Diagnosis not present

## 2017-08-20 DIAGNOSIS — A5900 Urogenital trichomoniasis, unspecified: Secondary | ICD-10-CM | POA: Diagnosis not present

## 2017-08-20 DIAGNOSIS — Z124 Encounter for screening for malignant neoplasm of cervix: Secondary | ICD-10-CM | POA: Diagnosis not present

## 2017-08-20 MED FILL — metroNIDAZOLE 500 MG TABS: 500 | 7 days supply | Qty: 14 | Fill #0

## 2018-01-19 ENCOUNTER — Ambulatory Visit: Payer: 59 | Admitting: Family Medicine

## 2018-01-19 MED FILL — CEPHALEXIN 500 MG CAPSULE: 500 | 7 days supply | Qty: 28 | Fill #0

## 2018-01-21 MED FILL — SULFAMETHOXAZOLE-TMP DS TAB: 800-160 | 7 days supply | Qty: 14 | Fill #0

## 2018-02-19 ENCOUNTER — Encounter: Payer: Self-pay | Admitting: Family Medicine

## 2018-02-19 ENCOUNTER — Other Ambulatory Visit: Payer: Self-pay

## 2018-02-19 ENCOUNTER — Ambulatory Visit (INDEPENDENT_AMBULATORY_CARE_PROVIDER_SITE_OTHER): Payer: 59 | Admitting: Family Medicine

## 2018-02-19 VITALS — BP 110/60 | HR 68 | Temp 98.5°F | Resp 16 | Ht 64.0 in | Wt 185.8 lb

## 2018-02-19 DIAGNOSIS — Z131 Encounter for screening for diabetes mellitus: Secondary | ICD-10-CM

## 2018-02-19 DIAGNOSIS — Z13220 Encounter for screening for lipoid disorders: Secondary | ICD-10-CM | POA: Diagnosis not present

## 2018-02-19 DIAGNOSIS — E559 Vitamin D deficiency, unspecified: Secondary | ICD-10-CM | POA: Diagnosis not present

## 2018-02-19 DIAGNOSIS — Z1231 Encounter for screening mammogram for malignant neoplasm of breast: Secondary | ICD-10-CM | POA: Diagnosis not present

## 2018-02-19 DIAGNOSIS — R5383 Other fatigue: Secondary | ICD-10-CM | POA: Diagnosis not present

## 2018-02-19 DIAGNOSIS — Z1239 Encounter for other screening for malignant neoplasm of breast: Secondary | ICD-10-CM

## 2018-02-19 DIAGNOSIS — Z0000 Encounter for general adult medical examination without abnormal findings: Secondary | ICD-10-CM

## 2018-02-19 NOTE — Progress Notes (Signed)
Chief Complaint  Patient presents with  . Annual Exam    cpe without pap    Subjective:  Deanna [name] is a 52 y.o. female here for a health maintenance visit.  Patient is established pt  Patient Active Problem List   Diagnosis Date Noted  . Spinal stenosis of lumbar region 09/22/2015  . Vertigo, intermittent 01/09/2015  . Hx of adenomatous polyp of colon 09/21/2014  . Restless legs syndrome (RLS) 05/18/2014  . Platelet dysfunction ([place]) 05/16/2014  . Screening for breast cancer 05/11/2014  . Fatigue 10/15/2013  . Bleeding disorder ([place]) 08/31/2013  . Occipital neuralgia 07/19/2013  . Paresthesias 07/19/2013  . Hematuria 07/19/2013  . Family history of colon cancer requiring screening colonoscopy 04/12/2013  . Bleeding gums 04/12/2013  . Breast cancer screening 04/12/2013  . Other specified periodontal diseases 04/12/2013  . Other malaise and fatigue 04/12/2013  . Anemia, unspecified 04/12/2013  . Unspecified vitamin D deficiency 04/12/2013  . Palpitations 03/09/2013  . Bleeding 03/09/2013  . Thyroid disease   . Dysplastic nevus     Past Medical History:  Diagnosis Date  . Bleeding disorder ([place]) 08/31/2013  . Bleeding gums   . Clotting disorder ([place])   . Dysplastic nevus    followed yearly by [facility].  Marland Kitchen Hx of adenomatous polyp of colon 09/21/2014  . Hx of transfusion of packed red blood cells   . Restless legs syndrome (RLS) 05/18/2014  . Spinal stenosis of lumbar region 09/22/2015  . Thyroid disease    Post-partum  . Vertigo, intermittent 01/09/2015    Past Surgical History:  Procedure Laterality Date  . BREAST SURGERY Left 2007   removal of a severely dysplastic nevus vs. melanoma  . NASAL FRACTURE SURGERY  1995   post-op hemorrhage  . PARTIAL HYSTERECTOMY  11/04/2004   DUB/adenomyosis. Ovaries intact.  Deanna Ina.  . resection dysplastic nevus  2012     Outpatient Medications Prior to Visit  Medication Sig Dispense Refill  .  Cholecalciferol (VITAMIN D3) 1000 units CAPS Take by mouth daily.    . ergocalciferol (VITAMIN D2) 50000 units capsule Take 1 capsule (50,000 Units total) by mouth once a week. (Patient not taking: Reported on 02/19/2018) 8 capsule 0  . escitalopram (LEXAPRO) 10 MG tablet TAKE 1 TABLET BY MOUTH ONCE DAILY (Patient not taking: Reported on 07/09/2017) 60 tablet PRN  . hydrOXYzine (VISTARIL) 25 MG capsule Take 1 capsule (25 mg total) by mouth 3 (three) times daily as needed. (Patient not taking: Reported on 02/19/2018) 30 capsule 0   No facility-administered medications prior to visit.     No Known Allergies   Family History  Problem Relation Age of Onset  . Colon cancer Mother 71  . Diabetes Mother   . Hypertension Mother   . Cancer Mother 46       colon cancer  . Dementia Mother   . Stroke Father 27  . Hypertension Brother   . Heart disease Brother   . Thyroid disease Sister   . Colon cancer Sister 67  . Cancer Sister 31       colon cancer  . COPD Sister   . Heart disease Sister 53       AMI  . Cancer Brother 68       soft tissue cancer in throat/neck  . Cancer Brother 18       bone cancer  . Heart disease Brother        multiple AMI     Health  Habits: Dental Exam: up to date Eye Exam: not up to date Exercise: 3 times/week on average Current exercise activities: walking/running Diet: balanced  Social History   Socioeconomic History  . Marital status: Married    Spouse name: Not on file  . Number of children: 2  . Years of education: AS  . 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 Financial resource strain: Not on file  . Food insecurity:    Worry: Not on file    Inability: Not on file  . Transportation needs:    Medical: Not on file    Non-medical: Not on file  Tobacco Use  . Smoking status: Never Smoker  . Smokeless tobacco: Never Used  Substance and  Sexual Activity  . Alcohol use: No  . Drug use: No  . Sexual activity: Yes    Birth control/protection: None  Lifestyle  . Physical activity:    Days per week: Not on file    Minutes per session: Not on file  . Stress: Not on file  Relationships  . Social connections:    Talks on phone: Not on file    Gets together: Not on file    Attends religious service: Not on file    Active member of club or organization: Not on file    Attends meetings of clubs or organizations: Not on file    Relationship status: Not on file  . Intimate partner violence:    Fear of current or ex partner: Not on file    Emotionally abused: Not on file    Physically abused: Not on file    Forced sexual activity: Not on file  Other Topics Concern  . Not on file  Social History Narrative   Marital status: married x 23 years; happily married; no abuse.      Children: 2 children (19, 17); no grandchildren.      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      Tobacco:  None      Alcohol: none      Drugs: none      Exercise:  Walking, strength training daily.      Seatbelt: 100%      Guns: none       Sunscreen:  SPF 30.   Patient is right handed.   Patient drinks 2 cups caffeine daily.   Social History   Substance and Sexual Activity  Alcohol Use No   Social History   Tobacco Use  Smoking Status Never Smoker  Smokeless Tobacco Never Used   Social History   Substance and Sexual Activity  Drug Use No    GYN: Sexual Health Menstrual status: regular menses LMP: No LMP recorded. Patient has had a hysterectomy. Last pap smear: see HM section History of abnormal pap smears:  Sexually active: with female partner Current contraception: hysterectomy  Health Maintenance: See under health Maintenance activity for review of completion dates as well. Immunization History  Administered Date(s) Administered  . Influenza,inj,Quad PF,6+ Mos 08/08/2015  . Tdap 10/04/2009       Depression Screen-PHQ2/9 Depression screen [facility] 2/9 02/19/2018 07/09/2017 09/18/2016 09/04/2016 01/20/2016  Decreased Interest 0 0 0 0 0  Down, Depressed, Hopeless 0 0 0 0 0  PHQ - 2 Score 0 0 0 0 0  Altered sleeping - - - - -  Tired, decreased energy - - - - -  Change in appetite - - - - -  Feeling bad or failure about yourself  - - - - -  Trouble concentrating - - - - -  Moving slowly or fidgety/restless - - - - -  Suicidal thoughts - - - - -  PHQ-9 Score - - - - -     Depression Severity and Treatment Recommendations:  0-4= None  5-9= Mild / Treatment: Support, educate to call if worse; return in one month  10-14= Moderate / Treatment: Support, watchful waiting; Antidepressant or Psycotherapy  15-19= Moderately severe / Treatment: Antidepressant OR Psychotherapy  >= 20 = Major depression, severe / Antidepressant AND Psychotherapy     Review of Systems   Review of Systems  Constitutional: Negative for chills and fever.  HENT: Negative for ear discharge, ear pain, hearing loss and tinnitus.   Eyes: Negative for blurred vision and double vision.  Respiratory: Negative for cough, shortness of breath and wheezing.   Cardiovascular: Negative for chest pain, claudication and leg swelling.  Gastrointestinal: Negative for abdominal pain, heartburn, nausea and vomiting.  Genitourinary: Negative for dysuria, frequency and urgency.  Skin: Negative for itching and rash.  Neurological: Negative for dizziness, tingling, tremors and headaches.  Endo/Heme/Allergies:       Hot flashes  Psychiatric/Behavioral: Negative for depression. The patient is not nervous/anxious and does not have insomnia.       Objective:   Vitals:   02/19/18 1407  BP: 110/60  Pulse: 68  Resp: 16  Temp: 98.5 F (36.9 C)  TempSrc: Oral  SpO2: 100%  Weight: 185 lb 12.8 oz (84.3 kg)  Height: 5\' 4"  (1.626 m)   Wt Readings from Last 3 Encounters:  02/19/18 185 lb 12.8 oz (84.3 kg)  07/09/17 190 lb  9.6 oz (86.5 kg)  10/21/16 192 lb (87.1 kg)    Body mass index is 31.89 kg/m.  Physical Exam  BP 110/60 (BP Location: Right Arm, Patient Position: Sitting, Cuff Size: Large)   Pulse 68   Temp 98.5 F (36.9 C) (Oral)   Resp 16   Ht 5\' 4"  (1.626 m)   Wt 185 lb 12.8 oz (84.3 kg)   SpO2 100%   BMI 31.89 kg/m   General Appearance:    Alert, cooperative, no distress, appears stated age  Head:    Normocephalic, without obvious abnormality, atraumatic  Eyes:    PERRL, conjunctiva/corneas clear, EOM's intact, fundi    benign, both eyes  Ears:    Normal TM's and external ear canals, both ears  Nose:   Nares normal, septum midline, mucosa normal, no drainage    or sinus tenderness  Throat:   Lips, mucosa, and tongue normal; teeth and gums normal  Neck:   Supple, symmetrical, trachea midline, no adenopathy;    thyroid:  no enlargement/tenderness/nodules; no carotid   bruit or JVD  Back:     Symmetric, no curvature, ROM normal, no CVA tenderness  Lungs:     Clear to auscultation bilaterally, respirations unlabored  Chest Wall:    No tenderness or deformity   Heart:    Regular rate and rhythm, S1 and S2 normal, no murmur, rub   or gallop  Breast Exam:    Chaperone present. No tenderness, masses, or nipple abnormality  Abdomen:     Soft, non-tender, bowel sounds active all four quadrants,    no masses, no organomegaly  Genitalia:    Deferred, pt s/p hysterectomy. Will perform pelvic with next physical  Extremities:  Extremities normal, atraumatic, no cyanosis or edema  Pulses:   2+ and symmetric all extremities  Skin:   Skin color, texture, turgor normal, no rashes or lesions  Lymph nodes:   Cervical, supraclavicular, and axillary nodes normal  Neurologic:   CNII-XII intact, normal strength, sensation and reflexes    throughout      Assessment/Plan:   Patient was seen for a health maintenance exam.  Counseled the patient on health maintenance issues. Reviewed her health  mainteance schedule and ordered appropriate tests (see orders.) Counseled on regular exercise and weight management. Recommend regular eye exams and dental cleaning.   The following issues were addressed today for health maintenance:   Rayssa was seen today for annual exam.  Diagnoses and all orders for this visit:  Encounter for health maintenance examination in adult- reviewed age appropriate screenings Colonoscopy due 2020  Vitamin D deficiency- currently taking otc supplement Will check levels  -     VITAMIN D 25 Hydroxy (Vit-D Deficiency, Fractures)  Screening, lipid -     Lipid panel -     Hemoglobin A1c -     Comprehensive metabolic panel  Screening for diabetes mellitus -     Hemoglobin A1c -     Comprehensive metabolic panel  Screening for breast cancer- advised pt to get mammogram and to monitor   Other fatigue -     TSH -     CBC -     VITAMIN D 25 Hydroxy (Vit-D Deficiency, Fractures)    No follow-ups on file.    Body mass index is 31.89 kg/m.:  Discussed the patient's BMI with patient. The BMI body mass index is 31.89 kg/m.     No future appointments.  Patient Instructions       IF you received an x-ray today, you will receive an invoice from [facility]. Please contact [facility] at [phone] with questions or concerns regarding your invoice.   IF you received labwork today, you will receive an invoice from [facility]. Please contact [facility] at [phone] with questions or concerns regarding your invoice.   Our billing staff will not be able to assist you with questions regarding bills from these companies.  You will be contacted with the lab results as soon as they are available. The fastest way to get your results is to activate your My Chart account. Instructions are located on the last page of this paperwork. If you have not heard from [place] regarding the results in 2 weeks, please contact this office.    We recommend  that you schedule a mammogram for breast cancer screening. Typically, you do not need a referral to do this. Please contact a local imaging center to schedule your mammogram.  [facility] - [phone]  *ask for the Radiology Department [facility] - [phone] or [phone]  [facility] - [phone] [place] [phone] [facility] - [phone]  *ask for the [facility] - [phone]  *ask for the Radiology Department [facility] - [phone]  *ask for the [place] - [phone]

## 2018-02-19 NOTE — Patient Instructions (Addendum)
Black Cohosh, Evening Primrose Flaxseed     IF you received an x-ray today, you will receive an invoice from [facility]. Please contact [facility] at [phone] with questions or concerns regarding your invoice.   IF you received labwork today, you will receive an invoice from [facility]. Please contact [facility] at [phone] with questions or concerns regarding your invoice.   Our billing staff will not be able to assist you with questions regarding bills from these companies.  You will be contacted with the lab results as soon as they are available. The fastest way to get your results is to activate your My Chart account. Instructions are located on the last page of this paperwork. If you have not heard from [place] regarding the results in 2 weeks, please contact this office.    We recommend that you schedule a mammogram for breast cancer screening. Typically, you do not need a referral to do this. Please contact a local imaging center to schedule your mammogram.  [facility] - [phone]  *ask for the Radiology [place] - [phone] or [phone]  [facility] - [phone] [place] [phone] [facility] - [phone]  *ask for the [facility] - [phone]  *ask for the Radiology Department [facility] - [phone]  *ask for the [place] - [phone]  Perimenopause Perimenopause is the time when your body begins to move into the menopause (no menstrual period for 12 straight months). It is a natural process. Perimenopause can begin 2-8 years before the menopause and usually lasts for 1 year after the menopause. During this time, your ovaries may or may not produce an egg. The ovaries vary in their production of estrogen and progesterone hormones each month. This can cause  irregular menstrual periods, difficulty getting pregnant, vaginal bleeding between periods, and uncomfortable symptoms. What are the causes?  Irregular production of the ovarian hormones, estrogen and progesterone, and not ovulating every month. Other causes include:  Tumor of the pituitary gland in the brain.  Medical disease that affects the ovaries.  Radiation treatment.  Chemotherapy.  Unknown causes.  Heavy smoking and excessive alcohol intake can bring on perimenopause sooner.  What are the signs or symptoms?  Hot flashes.  Night sweats.  Irregular menstrual periods.  Decreased sex drive.  Vaginal dryness.  Headaches.  Mood swings.  Depression.  Memory problems.  Irritability.  Tiredness.  Weight gain.  Trouble getting pregnant.  The beginning of losing bone cells (osteoporosis).  The beginning of hardening of the arteries (atherosclerosis). How is this diagnosed? Your health care provider will make a diagnosis by analyzing your age, menstrual history, and symptoms. He or she will do a physical exam and note any changes in your body, especially your female organs. Female hormone tests may or may not be helpful depending on the amount of female hormones you produce and when you produce them. However, other hormone tests may be helpful to rule out other problems. How is this treated? In some cases, no treatment is needed. The decision on whether treatment is necessary during the perimenopause should be made by you and your health care provider based on how the symptoms are affecting you and your lifestyle. Various treatments are available, such as:  Treating individual symptoms with a specific medicine for that symptom.  Herbal medicines that can help specific symptoms.  Counseling.  Group therapy.  Follow these instructions at home:  Keep track of your menstrual periods (when they occur, how heavy they are, how long between periods, and how long  they last) as well as your symptoms and when they started.  Only take over-the-counter or prescription medicines as directed by your health care provider.  Sleep and rest.  Exercise.  Eat a diet that contains calcium (good for your bones) and soy (acts like the estrogen hormone).  Do not smoke.  Avoid alcoholic beverages.  Take vitamin supplements as recommended by your health care provider. Taking vitamin E may help in certain cases.  Take calcium and vitamin D supplements to help prevent bone loss.  Group therapy is sometimes helpful.  Acupuncture may help in some cases. Contact a health care provider if:  You have questions about any symptoms you are having.  You need a referral to a specialist (gynecologist, psychiatrist, or psychologist). Get help right away if:  You have vaginal bleeding.  Your period lasts longer than 8 days.  Your periods are recurring sooner than 21 days.  You have bleeding after intercourse.  You have severe depression.  You have pain when you urinate.  You have severe headaches.  You have vision problems. This information is not intended to replace advice given to you by your health care provider. Make sure you discuss any questions you have with your health care provider. Document Released: 11/28/2004 Document Revised: 03/28/2016 Document Reviewed: 05/20/2013 Elsevier Interactive Patient Education  2017 [employer].

## 2018-02-20 LAB — CBC
Hematocrit: 40.2 % (ref 34.0–46.6)
Hemoglobin: 13.2 g/dL (ref 11.1–15.9)
MCH: 29.8 pg (ref 26.6–33.0)
MCHC: 32.8 g/dL (ref 31.5–35.7)
MCV: 91 fL (ref 79–97)
PLATELETS: 298 10*3/uL (ref 150–379)
RBC: 4.43 x10E6/uL (ref 3.77–5.28)
RDW: 13.8 % (ref 12.3–15.4)
WBC: 5.3 10*3/uL (ref 3.4–10.8)

## 2018-02-20 LAB — VITAMIN D 25 HYDROXY (VIT D DEFICIENCY, FRACTURES): VIT D 25 HYDROXY: 53.7 ng/mL (ref 30.0–100.0)

## 2018-02-20 LAB — COMPREHENSIVE METABOLIC PANEL
ALK PHOS: 78 IU/L (ref 39–117)
ALT: 11 IU/L (ref 0–32)
AST: 14 IU/L (ref 0–40)
Albumin/Globulin Ratio: 1.7 (ref 1.2–2.2)
Albumin: 4.8 g/dL (ref 3.5–5.5)
BUN/Creatinine Ratio: 14 (ref 9–23)
BUN: 11 mg/dL (ref 6–24)
Bilirubin Total: 0.5 mg/dL (ref 0.0–1.2)
CO2: 26 mmol/L (ref 20–29)
CREATININE: 0.78 mg/dL (ref 0.57–1.00)
Calcium: 9.9 mg/dL (ref 8.7–10.2)
Chloride: 98 mmol/L (ref 96–106)
GFR calc Af Amer: 103 mL/min/{1.73_m2} (ref >59)
GFR calc non Af Amer: 90 mL/min/{1.73_m2} (ref >59)
GLOBULIN, TOTAL: 2.9 g/dL (ref 1.5–4.5)
Glucose: 75 mg/dL (ref 65–99)
Potassium: 4.3 mmol/L (ref 3.5–5.2)
SODIUM: 139 mmol/L (ref 134–144)
Total Protein: 7.7 g/dL (ref 6.0–8.5)

## 2018-02-20 LAB — LIPID PANEL
Chol/HDL Ratio: 3.1 ratio (ref 0.0–4.4)
Cholesterol, Total: 237 mg/dL — ABNORMAL HIGH (ref 100–199)
HDL: 76 mg/dL (ref >39)
LDL Calculated: 147 mg/dL — ABNORMAL HIGH (ref 0–99)
Triglycerides: 69 mg/dL (ref 0–149)
VLDL CHOLESTEROL CAL: 14 mg/dL (ref 5–40)

## 2018-02-20 LAB — HEMOGLOBIN A1C
ESTIMATED AVERAGE GLUCOSE: 97 mg/dL
HEMOGLOBIN A1C: 5 % (ref 4.8–5.6)

## 2018-02-20 LAB — TSH: TSH: 2.02 u[IU]/mL (ref 0.450–4.500)

## 2019-10-11 ENCOUNTER — Telehealth: Payer: Self-pay

## 2019-10-11 NOTE — Telephone Encounter (Signed)
Copied from [place] [phone]. Topic: Appointment Scheduling - Scheduling Inquiry for Clinic >> Oct 11, 2019 12:48 PM Richardo Priest, [place] wrote: Reason for CRM: Pt called in stating she would like Dr.Copland to be here PCP. Please advise.

## 2019-10-11 NOTE — Telephone Encounter (Signed)
Please call to set up patient as transfer of care

## 2019-10-12 NOTE — Telephone Encounter (Signed)
LM for pt to call and schedule appt to transfer to Dr. Lorelei Pont. Please use visit type NEW PATIENT.

## 2019-10-20 NOTE — Progress Notes (Signed)
[facility] [place], [place], [place] [address] [phone]  Date:  10/21/2019   Name:  Deanna [name]   DOB:  1966-04-30   MRN:  [id number]  PCP:  Darreld Mclean, MD    Chief Complaint: New Patient (Initial Visit) (pain under rib, right side, radiated to back, 3 months)   History of Present Illness:  Deanna [name] is a 53 y.o. 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  Her platelet numbers are normal, but she has platelet dysfunction  She is a Engineer, building services for [employer] They have been terribly busy trying to keep shifts covered during the pandemic.  She has been under a lot of stress She is taking vitamin D3 as well as her nasal spray prn   Her mother and sister have history of colon cancer- her sister actually died from this disease about 18 months ago Her other sister was recently dx with breast cancer-her situation is still being figured out  Her son is 37, daughter is 42 Her son is working as an Chief Financial Officer, her daughter is working with Patent examiner- she was an Database administrator major at [employer]  She sees GYN, s/p partial hyst Needs an order for her mammo  She has noted some pain in her right upper quadrant, under the right ribs, for a few months.  This is not necessarily related to eating.  No fever or chills, no vomiting.  She is really not sure if this is the ribs, or the abdomen She still has her gallbladder  Patient Active Problem List   Diagnosis Date Noted  . Spinal stenosis of lumbar region 09/22/2015  . Vertigo, intermittent  01/09/2015  . Hx of adenomatous polyp of colon 09/21/2014  . Restless legs syndrome (RLS) 05/18/2014  . Platelet dysfunction ([place]) 05/16/2014  . Paresthesias 07/19/2013  . Hematuria 07/19/2013  . Family history of colon cancer requiring screening colonoscopy 04/12/2013  . Bleeding gums 04/12/2013  . Unspecified vitamin D deficiency 04/12/2013  . Palpitations 03/09/2013  . Bleeding 03/09/2013  . Thyroid disease   . Dysplastic nevus     Past Medical History:  Diagnosis Date  . Bleeding disorder ([place]) 08/31/2013  . Bleeding gums   . Clotting disorder ([place])   . Dysplastic nevus    followed yearly by [facility].  Marland Kitchen Hx of adenomatous polyp of colon 09/21/2014  . Hx of transfusion of packed red blood cells   . Restless legs syndrome (RLS) 05/18/2014  . Spinal stenosis of lumbar region 09/22/2015  . Thyroid disease    Post-partum  . Vertigo, intermittent 01/09/2015    Past Surgical History:  Procedure Laterality Date  . BREAST BIOPSY Left    Stereotactic needle biopsy-benign  . BREAST SURGERY Left 2007   removal of a severely dysplastic nevus vs. melanoma  . NASAL FRACTURE SURGERY  1995   post-op hemorrhage  . PARTIAL HYSTERECTOMY  11/04/2004   DUB/adenomyosis. Ovaries intact.  Deanna [name].  . resection dysplastic nevus  2012    Social History   Tobacco Use  .  Smoking status: Never Smoker  . Smokeless tobacco: Never Used  Substance Use Topics  . Alcohol use: No  . Drug use: No    Family History  Problem Relation Age of Onset  . Colon cancer Mother 74  . Diabetes Mother   . Hypertension Mother   . Cancer Mother 67       colon cancer  . Dementia Mother   . Stroke Father 80  . Hypertension Brother   . Heart disease Brother   . Thyroid disease Sister   . Colon cancer Sister 18  . Cancer Sister 83       colon cancer  . COPD Sister   . Heart disease Sister 76       AMI  . Cancer Brother 68       soft tissue cancer in throat/neck  . Cancer Brother 47        bone cancer  . Heart disease Brother        multiple AMI    No Known Allergies  Medication list has been reviewed and updated.  Current Outpatient Medications on File Prior to Visit  Medication Sig Dispense Refill  . Cholecalciferol (VITAMIN D3) 1000 units CAPS Take by mouth daily.     No current facility-administered medications on file prior to visit.    Review of Systems:  As per HPI- otherwise negative. No fever or chills, no chest pain or shortness of breath  Physical Examination: Vitals:   10/21/19 1303  BP: 118/80  Pulse: 72  Resp: 16  Temp: (!) 95.6 F (35.3 C)  SpO2: 98%   Vitals:   10/21/19 1303  Weight: 196 lb (88.9 kg)  Height: 5\' 4"  (1.626 m)   Body mass index is 33.64 kg/m. Ideal Body Weight: Weight in (lb) to have BMI = 25: 145.3  GEN: WDWN, NAD, Non-toxic, A & O x 3, overweight, looks well HEENT: Atraumatic, Normocephalic. Neck supple. No masses, No LAD.  Bilateral TM wnl, oropharynx normal.  PEERL,EOMI.   Ears and Nose: No external deformity. CV: RRR, No M/G/R. No JVD. No thrill. No extra heart sounds. PULM: CTA B, no wheezes, crackles, rhonchi. No retractions. No resp. distress. No accessory muscle use. ABD: S, NT, ND, +BS. No rebound. No HSM.  Negative Murphy sign.  Her abdomen is benign at this time EXTR: No c/c/e NEURO Normal gait.  PSYCH: Normally interactive. Conversant. Not depressed or anxious appearing.  Calm demeanor.    Assessment and Plan: Vitamin D deficiency - Plan: Vitamin D (25 hydroxy)  Screening, lipid - Plan: Lipid panel  Screening for diabetes mellitus - Plan: Comprehensive metabolic panel, Hemoglobin A1c  Platelet dysfunction (HCC) - Plan: CBC  Screening for thyroid disorder - Plan: TSH  Family history of cancer - Plan: Ambulatory referral to Genetics  RUQ pain - Plan: DG Abd Acute W/Chest, US Abdomen Limited RUQ, DG Ribs Unilateral Right  Encounter for screening mammogram for malignant neoplasm of breast - Plan:  MM 3D SCREEN BREAST BILATERAL Here today to reestablish care, will have her health maintenance and discuss a couple of problems Unfortunately there are been several instances of cancer in first-degree relatives.  I will refer her to medical genetics for evaluation Labs pending as above Ordered mammogram Right upper quadrant/right rib pain-this does not follow any particular pattern.  Consider gallbladder versus other pathology.  Patient does note that she can get constipated at times, wonders if this could be related  I will get labs, films, right upper  quadrant ultrasound  This visit occurred during the SARS-CoV-2 public health emergency.  Safety protocols were in place, including screening questions prior to the visit, additional usage of staff PPE, and extensive cleaning of exam room while observing appropriate contact time as indicated for disinfecting solutions.   Signed Lamar Blinks, MD  Received her labs and films as below, message to patient  Results for orders placed or performed in visit on 10/21/19  CBC  Result Value Ref Range   WBC 7.1 4.0 - 10.5 K/uL   RBC 4.37 3.87 - 5.11 Mil/uL   Platelets 328.0 150.0 - 400.0 K/uL   Hemoglobin 13.2 12.0 - 15.0 g/dL   HCT 39.4 36.0 - 46.0 %   MCV 90.1 78.0 - 100.0 fl   MCHC 33.4 30.0 - 36.0 g/dL   RDW 13.1 11.5 - 15.5 %  Comprehensive metabolic panel  Result Value Ref Range   Sodium 139 135 - 145 mEq/L   Potassium 3.9 3.5 - 5.1 mEq/L   Chloride 101 96 - 112 mEq/L   CO2 31 19 - 32 mEq/L   Glucose, Bld 71 70 - 99 mg/dL   BUN 13 6 - 23 mg/dL   Creatinine, Ser 0.71 0.40 - 1.20 mg/dL   Total Bilirubin 0.3 0.2 - 1.2 mg/dL   Alkaline Phosphatase 90 39 - 117 U/L   AST 13 0 - 37 U/L   ALT 10 0 - 35 U/L   Total Protein 7.8 6.0 - 8.3 g/dL   Albumin 4.5 3.5 - 5.2 g/dL   GFR 86.63 >60.00 mL/min   Calcium 9.6 8.4 - 10.5 mg/dL  Hemoglobin A1c  Result Value Ref Range   Hgb A1c MFr Bld 5.2 4.6 - 6.5 %  Lipid panel  Result Value Ref  Range   Cholesterol 244 (H) 0 - 200 mg/dL   Triglycerides 133.0 0.0 - 149.0 mg/dL   HDL 76.80 >39.00 mg/dL   VLDL 26.6 0.0 - 40.0 mg/dL   LDL Cholesterol 140 (H) 0 - 99 mg/dL   Total CHOL/HDL Ratio 3    NonHDL 166.75   TSH  Result Value Ref Range   TSH 2.50 0.35 - 4.50 uIU/mL  Vitamin D (25 hydroxy)  Result Value Ref Range   VITD 39.54 30.00 - 100.00 ng/mL    Your labs overall look good Normal blood counts Metabolic profile is normal Hemoglobin A1c is normal, no sign of diabetes Thyroid, vitamin D normal  Your cholesterol is a bit higher than ideal, but your 10-year risk of cardiovascular disease is still quite favorable-see below  I think your plan to work on diet and exercise is a good one, we can also consider cholesterol medication if you prefer  The 10-year ASCVD risk score Mikey Bussing DC Jr., et al., 2013) is: 1%   Values used to calculate the score:     Age: 7 years     Sex: Female     Is Non-Hispanic African American: No     Diabetic: No     Tobacco smoker: No     Systolic Blood Pressure: 123456 mmHg     Is BP treated: No     HDL Cholesterol: 76.8 mg/dL     Total Cholesterol: 244 mg/dL  Here are your x-ray reports from today  DG Ribs Unilateral Right  Result Date: 10/21/2019 CLINICAL DATA:  Lower right rib pain.  No known injury. EXAM: RIGHT RIBS - 2 VIEW COMPARISON:  Chest x-ray 01/17/2016 FINDINGS: No fracture or other bone lesions are  seen involving the ribs. Multiple surgical clips in the right axilla. CLINICAL DATA:  Lower right rib pain. No known injury. EXAM: RIGHT RIBS - 2 VIEW COMPARISON:  Chest x-ray 01/17/2016 FINDINGS: No fracture or other bone lesions are seen involving the ribs. Multiple surgical clips in the right axilla. IMPRESSION: Normal right ribs. No significant abnormalities. Electronically Signed   By: Lorriane Shire M.D.   On: 10/21/2019 15:58   DG Abd Acute W/Chest  Result Date: 10/21/2019 CLINICAL DATA:  Pain under right ribs. EXAM: DG ABDOMEN  ACUTE W/ 1V CHEST COMPARISON:  CT 11/23/2003. FINDINGS: Soft tissue structures are unremarkable. Large amount of stool noted throughout the colon suggesting constipation. No bowel distention. No free air. Punctate scattered densities noted over the left abdomen, possibly residual pill fragments. No evidence of ureteral stone. No acute bony abnormality. Chest x-ray reveals no acute cardiopulmonary disease. Surgical clips right axilla. IMPRESSION: 1. Large amount of stool noted throughout the colon suggesting constipation. No acute intra-abdominal abnormality. 2.  No acute cardiopulmonary disease.  No acute bony abnormality. Electronically Signed   By: Marcello Moores  Register   On: 10/21/2019 15:48    Your ribs look normal, no sign of any bone pathology The abdominal series does show constipation, this could be contributing to your discomfort-otherwise normal Let us plan to proceed with an ultrasound of your gallbladder as we discussed Please see me posted about any changes or concerns  Let us plan to visit in about 6 months assuming all is well

## 2019-10-21 ENCOUNTER — Other Ambulatory Visit: Payer: Self-pay

## 2019-10-21 ENCOUNTER — Encounter: Payer: Self-pay | Admitting: Family Medicine

## 2019-10-21 ENCOUNTER — Ambulatory Visit (HOSPITAL_BASED_OUTPATIENT_CLINIC_OR_DEPARTMENT_OTHER)
Admission: RE | Admit: 2019-10-21 | Discharge: 2019-10-21 | Disposition: A | Discharge status: Home / Self Care | Payer: No Typology Code available for payment source | Source: Ambulatory Visit | Attending: Family Medicine | Admitting: Family Medicine

## 2019-10-21 ENCOUNTER — Ambulatory Visit (INDEPENDENT_AMBULATORY_CARE_PROVIDER_SITE_OTHER): Payer: No Typology Code available for payment source | Admitting: Family Medicine

## 2019-10-21 ENCOUNTER — Encounter (HOSPITAL_BASED_OUTPATIENT_CLINIC_OR_DEPARTMENT_OTHER): Payer: Self-pay

## 2019-10-21 ENCOUNTER — Telehealth: Payer: Self-pay

## 2019-10-21 VITALS — BP 118/80 | HR 72 | Temp 95.6°F | Resp 16 | Ht 64.0 in | Wt 196.0 lb

## 2019-10-21 DIAGNOSIS — Z1231 Encounter for screening mammogram for malignant neoplasm of breast: Secondary | ICD-10-CM | POA: Diagnosis present

## 2019-10-21 DIAGNOSIS — R1011 Right upper quadrant pain: Secondary | ICD-10-CM

## 2019-10-21 DIAGNOSIS — Z809 Family history of malignant neoplasm, unspecified: Secondary | ICD-10-CM

## 2019-10-21 DIAGNOSIS — E559 Vitamin D deficiency, unspecified: Secondary | ICD-10-CM | POA: Diagnosis not present

## 2019-10-21 DIAGNOSIS — D691 Qualitative platelet defects: Secondary | ICD-10-CM

## 2019-10-21 DIAGNOSIS — Z13220 Encounter for screening for lipoid disorders: Secondary | ICD-10-CM | POA: Diagnosis not present

## 2019-10-21 DIAGNOSIS — Z1329 Encounter for screening for other suspected endocrine disorder: Secondary | ICD-10-CM

## 2019-10-21 DIAGNOSIS — K5900 Constipation, unspecified: Secondary | ICD-10-CM | POA: Insufficient documentation

## 2019-10-21 DIAGNOSIS — Z131 Encounter for screening for diabetes mellitus: Secondary | ICD-10-CM | POA: Diagnosis not present

## 2019-10-21 DIAGNOSIS — R0781 Pleurodynia: Secondary | ICD-10-CM | POA: Insufficient documentation

## 2019-10-21 LAB — TSH: TSH: 2.5 u[IU]/mL (ref 0.35–4.50)

## 2019-10-21 LAB — COMPREHENSIVE METABOLIC PANEL
ALT: 10 U/L (ref 0–35)
AST: 13 U/L (ref 0–37)
Albumin: 4.5 g/dL (ref 3.5–5.2)
Alkaline Phosphatase: 90 U/L (ref 39–117)
BUN: 13 mg/dL (ref 6–23)
CO2: 31 mEq/L (ref 19–32)
Calcium: 9.6 mg/dL (ref 8.4–10.5)
Chloride: 101 mEq/L (ref 96–112)
Creatinine, Ser: 0.71 mg/dL (ref 0.40–1.20)
GFR: 86.63 mL/min (ref >60.00)
Glucose, Bld: 71 mg/dL (ref 70–99)
Potassium: 3.9 mEq/L (ref 3.5–5.1)
Sodium: 139 mEq/L (ref 135–145)
Total Bilirubin: 0.3 mg/dL (ref 0.2–1.2)
Total Protein: 7.8 g/dL (ref 6.0–8.3)

## 2019-10-21 LAB — LIPID PANEL
Cholesterol: 244 mg/dL — ABNORMAL HIGH (ref 0–200)
HDL: 76.8 mg/dL (ref >39.00)
LDL Cholesterol: 140 mg/dL — ABNORMAL HIGH (ref 0–99)
NonHDL: 166.75
Total CHOL/HDL Ratio: 3
Triglycerides: 133 mg/dL (ref 0.0–149.0)
VLDL: 26.6 mg/dL (ref 0.0–40.0)

## 2019-10-21 LAB — HEMOGLOBIN A1C: Hgb A1c MFr Bld: 5.2 % (ref 4.6–6.5)

## 2019-10-21 LAB — CBC
HCT: 39.4 % (ref 36.0–46.0)
Hemoglobin: 13.2 g/dL (ref 12.0–15.0)
MCHC: 33.4 g/dL (ref 30.0–36.0)
MCV: 90.1 fl (ref 78.0–100.0)
Platelets: 328 10*3/uL (ref 150.0–400.0)
RBC: 4.37 Mil/uL (ref 3.87–5.11)
RDW: 13.1 % (ref 11.5–15.5)
WBC: 7.1 10*3/uL (ref 4.0–10.5)

## 2019-10-21 LAB — VITAMIN D 25 HYDROXY (VIT D DEFICIENCY, FRACTURES): VITD: 39.54 ng/mL (ref 30.00–100.00)

## 2019-10-21 NOTE — Telephone Encounter (Signed)
Routed to wrong office 

## 2019-10-21 NOTE — Telephone Encounter (Signed)
Copied from [place] [phone]. Topic: General - Inquiry >> Oct 20, 2019  5:16 PM Alease Frame wrote: Reason for CRM: Patient called in for covid screening

## 2019-10-21 NOTE — Patient Instructions (Addendum)
Great to see you again today-  I will be in touch with your labs asap Please go to lab for your blood draw, and then to the ground floor to have plain films of your ribs/ chest/ abdomen I will set up a gallbladder ultrasound for you as well Mammogram is ordered and can be set up at your convenience  We will also arrange for you to see medical genetics about your family history of cancer

## 2019-11-05 DIAGNOSIS — U071 COVID-19: Secondary | ICD-10-CM

## 2019-11-05 HISTORY — DX: COVID-19: U07.1

## 2020-05-27 NOTE — Progress Notes (Deleted)
[facility] [address], [place], [place] [address] [phone]  Date:  05/29/2020   Name:  Deanna [name]   DOB:  09-Dec-1965   MRN:  [id number]  PCP:  Darreld Mclean, MD    Chief Complaint: No chief complaint on file.   History of Present Illness:  Deanna [name] is a 54 y.o. 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 Due now  Labs done in December    Patient Active Problem List   Diagnosis Date Noted  . Spinal stenosis of lumbar region 09/22/2015  . Vertigo, intermittent 01/09/2015  . Hx of adenomatous polyp of colon 09/21/2014  . Restless legs syndrome (RLS) 05/18/2014  . Platelet dysfunction ([place]) 05/16/2014  . Paresthesias 07/19/2013  . Hematuria 07/19/2013  . Family history of colon cancer requiring screening colonoscopy 04/12/2013  . Bleeding gums 04/12/2013  . Unspecified vitamin D deficiency 04/12/2013  . Palpitations 03/09/2013  . Bleeding 03/09/2013  . Thyroid disease   . Dysplastic nevus     Past Medical History:  Diagnosis Date  . Bleeding disorder ([place]) 08/31/2013  . Bleeding gums   . Clotting disorder ([place])   . Dysplastic nevus    followed yearly by [facility].  Marland Kitchen Hx of adenomatous polyp of colon 09/21/2014  . Hx of transfusion of packed red blood  cells   . Restless legs syndrome (RLS) 05/18/2014  . Spinal stenosis of lumbar region 09/22/2015  . Thyroid disease    Post-partum  . Vertigo, intermittent 01/09/2015    Past Surgical History:  Procedure Laterality Date  . BREAST BIOPSY Left    Stereotactic needle biopsy-benign  . BREAST SURGERY Left 2007   removal of a severely dysplastic nevus vs. melanoma  . NASAL FRACTURE SURGERY  1995   post-op hemorrhage  . PARTIAL HYSTERECTOMY  11/04/2004   DUB/adenomyosis. Ovaries intact.  [name]  . resection dysplastic nevus  2012    Social History   Tobacco Use  . Smoking status: Never Smoker  . Smokeless tobacco: Never Used  Substance Use Topics  . Alcohol use: No  . Drug use: No    Family History  Problem Relation Age of Onset  . Colon cancer Mother 28  . Diabetes Mother   . Hypertension Mother   . Cancer Mother 31       colon cancer  . Dementia Mother   . Stroke Father 30  . Hypertension Brother   . Heart disease Brother   . Thyroid disease Sister   . Colon cancer Sister 22  . Cancer Sister 27       colon cancer  . COPD Sister   . Heart disease Sister 48       AMI  . Cancer Brother 68       soft  tissue cancer in throat/neck  . Cancer Brother 79       bone cancer  . Heart disease Brother        multiple AMI    No Known Allergies  Medication list has been reviewed and updated.  Current Outpatient Medications on File Prior to Visit  Medication Sig Dispense Refill  . Cholecalciferol (VITAMIN D3) 1000 units CAPS Take by mouth daily.     No current facility-administered medications on file prior to visit.    Review of Systems:  As per HPI- otherwise negative.   Physical Examination: There were no vitals filed for this visit. There were no vitals filed for this visit. There is no height or weight on file to calculate BMI. Ideal Body Weight:    GEN: no acute distress. HEENT: Atraumatic, Normocephalic.  Ears and Nose: No external deformity. CV: RRR, No  M/G/R. No JVD. No thrill. No extra heart sounds. PULM: CTA B, no wheezes, crackles, rhonchi. No retractions. No resp. distress. No accessory muscle use. ABD: S, NT, ND, +BS. No rebound. No HSM. EXTR: No c/c/e PSYCH: Normally interactive. Conversant.    Assessment and Plan: *** This visit occurred during the SARS-CoV-2 public health emergency.  Safety protocols were in place, including screening questions prior to the visit, additional usage of staff PPE, and extensive cleaning of exam room while observing appropriate contact time as indicated for disinfecting solutions.    Signed Lamar Blinks, MD

## 2020-05-27 NOTE — Patient Instructions (Addendum)
Good to see you again today - I will be in touch with your labs asap I refilled your medication today to prevent bleeding  Referral to genetics to discuss any predisposition towards breast/ colon cancer Will also set you up with hematology to discuss your platelet disorder

## 2020-05-29 ENCOUNTER — Other Ambulatory Visit: Payer: Self-pay

## 2020-05-29 ENCOUNTER — Encounter: Payer: Self-pay | Admitting: Family Medicine

## 2020-05-29 ENCOUNTER — Ambulatory Visit (INDEPENDENT_AMBULATORY_CARE_PROVIDER_SITE_OTHER): Payer: No Typology Code available for payment source | Admitting: Family Medicine

## 2020-05-29 VITALS — BP 118/80 | HR 68 | Resp 16 | Ht 64.0 in | Wt 177.0 lb

## 2020-05-29 DIAGNOSIS — Z809 Family history of malignant neoplasm, unspecified: Secondary | ICD-10-CM | POA: Diagnosis not present

## 2020-05-29 DIAGNOSIS — Z1159 Encounter for screening for other viral diseases: Secondary | ICD-10-CM | POA: Diagnosis not present

## 2020-05-29 DIAGNOSIS — Z1211 Encounter for screening for malignant neoplasm of colon: Secondary | ICD-10-CM | POA: Diagnosis not present

## 2020-05-29 DIAGNOSIS — D691 Qualitative platelet defects: Secondary | ICD-10-CM | POA: Diagnosis not present

## 2020-05-29 LAB — COMPREHENSIVE METABOLIC PANEL
ALT: 9 U/L (ref 0–35)
AST: 13 U/L (ref 0–37)
Albumin: 4.2 g/dL (ref 3.5–5.2)
Alkaline Phosphatase: 73 U/L (ref 39–117)
BUN: 13 mg/dL (ref 6–23)
CO2: 30 mEq/L (ref 19–32)
Calcium: 9.3 mg/dL (ref 8.4–10.5)
Chloride: 105 mEq/L (ref 96–112)
Creatinine, Ser: 0.64 mg/dL (ref 0.40–1.20)
GFR: 97.42 mL/min (ref >60.00)
Glucose, Bld: 82 mg/dL (ref 70–99)
Potassium: 4.3 mEq/L (ref 3.5–5.1)
Sodium: 140 mEq/L (ref 135–145)
Total Bilirubin: 0.4 mg/dL (ref 0.2–1.2)
Total Protein: 7 g/dL (ref 6.0–8.3)

## 2020-05-29 LAB — CBC
HCT: 35.9 % — ABNORMAL LOW (ref 36.0–46.0)
Hemoglobin: 12 g/dL (ref 12.0–15.0)
MCHC: 33.5 g/dL (ref 30.0–36.0)
MCV: 90.2 fl (ref 78.0–100.0)
Platelets: 297 10*3/uL (ref 150.0–400.0)
RBC: 3.97 Mil/uL (ref 3.87–5.11)
RDW: 13.7 % (ref 11.5–15.5)
WBC: 5.9 10*3/uL (ref 4.0–10.5)

## 2020-05-29 MED ORDER — AMINOCAPROIC ACID 1000 MG PO TABS: 2000.0000 mg | ORAL_TABLET | Freq: Two times a day (BID) | ORAL | 0 refills | Status: DC

## 2020-05-29 MED FILL — AMINOCAPROIC ACID 500 MG TA: 500 | 15 days supply | Qty: 120 | Fill #0

## 2020-05-29 NOTE — Progress Notes (Addendum)
Deanna [name] at [facility], [place], [place] [address] [phone]  Date:  05/29/2020   Name:  Deanna [name]   DOB:  1965-11-17   MRN:  [id number]  PCP:  Darreld Mclean, MD    Chief Complaint: Immunizations (discuss covid vaccine)   History of Present Illness:  Deanna [name] is a 54 y.o. 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  Her condition has been stable, so she has not need to follow-up frequently. Her platelet numbers are normal, but she has platelet dysfunction and tends to bleed easily She is practice admin for [employer] She continues to be very busy with her work 2 grown children Breast and colon cancer run in the family- at last visit I referred her to medical genetics, she did not get an appointment for whatever reason.  I will reorder this referral for her  covid series- not done yet  Hep C screening- will do today  Colon cancer screening - 2015, due now. Refer to [facility] done in December; she would like to update today  She did have covid in January of this year.  She was sick for a few weeks, her most dramatic symptom was increase of her platelet dysfunction symptoms.  She had more bleeding of her gums with brushing or flossing.  She notes that her mouth with actually drip blood sometimes She sees DDS every 3-4 months; her teeth and gums are in good condition and she does not have gingivitis She has not yet gotten her vaccine but plans to do so soon-she wonders if there is any concern in getting the Covid vaccine with her platelet disorder.  I reviewed available information and do not find any contraindication  Her sense of smell and taste is not  back yet; she may notice a [place] cigarette smoke odor at times She also notes that she still feels a bit mentally foggy since she had Covid, this does seem to be gradually improving   Patient Active Problem List   Diagnosis Date Noted  . Spinal stenosis of lumbar region 09/22/2015  . Vertigo, intermittent 01/09/2015  . Hx of adenomatous polyp of colon 09/21/2014  . Restless legs syndrome (RLS) 05/18/2014  . Platelet dysfunction ([place]) 05/16/2014  . Paresthesias 07/19/2013  . Hematuria 07/19/2013  . Family history of colon cancer requiring screening colonoscopy 04/12/2013  . Bleeding gums 04/12/2013  . Unspecified vitamin D deficiency 04/12/2013  . Palpitations 03/09/2013  . Bleeding 03/09/2013  . Thyroid disease   . Dysplastic nevus     Past Medical History:  Diagnosis Date  . Bleeding disorder ([place]) 08/31/2013  . Bleeding gums   . Clotting disorder ([place])   . Dysplastic nevus    followed yearly by [facility].  Marland Kitchen Hx of adenomatous polyp of colon 09/21/2014  . Hx of transfusion of packed red blood cells   . Restless legs syndrome (RLS) 05/18/2014  . Spinal stenosis of lumbar region 09/22/2015  . Thyroid disease    Post-partum  . Vertigo, intermittent 01/09/2015    Past Surgical History:  Procedure Laterality Date  . BREAST BIOPSY Left    Stereotactic needle biopsy-benign  . BREAST SURGERY  Left 2007   removal of a severely dysplastic nevus vs. melanoma  . NASAL FRACTURE SURGERY  1995   post-op hemorrhage  . PARTIAL HYSTERECTOMY  11/04/2004   DUB/adenomyosis. Ovaries intact.  Deanna [name].  . resection dysplastic nevus  2012    Social History   Tobacco Use  . Smoking status: Never Smoker  . Smokeless tobacco: Never Used  Substance Use Topics  . Alcohol use: No  . Drug use: No    Family History  Problem Relation Age of Onset  . Colon cancer Mother 49  . Diabetes Mother   . Hypertension Mother   . Cancer Mother 40       colon cancer  . Dementia Mother    . Stroke Father 14  . Hypertension Brother   . Heart disease Brother   . Thyroid disease Sister   . Colon cancer Sister 26  . Cancer Sister 67       colon cancer  . COPD Sister   . Heart disease Sister 69       AMI  . Cancer Brother 68       soft tissue cancer in throat/neck  . Cancer Brother 14       bone cancer  . Heart disease Brother        multiple AMI    No Known Allergies  Medication list has been reviewed and updated.  Current Outpatient Medications on File Prior to Visit  Medication Sig Dispense Refill  . Cholecalciferol (VITAMIN D3) 1000 units CAPS Take by mouth daily.     No current facility-administered medications on file prior to visit.    Review of Systems:  As per HPI- otherwise negative.   Physical Examination: Vitals:   05/29/20 1131  BP: 118/80  Pulse: 68  Resp: 16  SpO2: 98%   Vitals:   05/29/20 1131  Weight: 177 lb (80.3 kg)  Height: 5\' 4"  (1.626 m)   Body mass index is 30.38 kg/m. Ideal Body Weight: Weight in (lb) to have BMI = 25: 145.3  GEN: no acute distress.  Mild obesity, looks well  HEENT: Atraumatic, Normocephalic.  Bilateral TM wnl, oropharynx normal.  PEERL,EOMI.   Her teeth and gums appear to be in excellent repair with little to no plaque buildup Ears and Nose: No external deformity. CV: RRR, No M/G/R. No JVD. No thrill. No extra heart sounds. PULM: CTA B, no wheezes, crackles, rhonchi. No retractions. No resp. distress. No accessory muscle use. ABD: S, NT, ND, +BS. No rebound. No HSM. EXTR: No c/c/e PSYCH: Normally interactive. Conversant.    Assessment and Plan: Platelet dysfunction ([place]) - Plan: CBC, Comprehensive metabolic panel, Aminocaproic Acid 1000 MG TABS, Ambulatory referral to Hematology  Family history of cancer - Plan: Ambulatory referral to [place] for colon cancer - Plan: Ambulatory referral to Gastroenterology  Encounter for hepatitis C screening test for low risk patient - Plan:  Hepatitis C antibody  Here today for follow-up visit. Ro would like to potentially establish with an in network, [place] hematologist/oncologist.  In the past she was referred to [facility] as we were not able to help her within the system.  However, her insurance plan prefers her to see a hematologist in network if possible.  I will place this referral for her, hopefully now that her diagnosis has already been made will be able to follow her up locally.  She needs a refill of her aminocaproic acid that she uses prior to dental procedures.  I refilled this per her previous prescription  Also referred to medical genetics for evaluation based on 2 sisters and mother with cancer-breast and colon  Referral to GI for colon cancer screening Hepatitis C antibody ordered  This visit occurred during the SARS-CoV-2 public health emergency.  Safety protocols were in place, including screening questions prior to the visit, additional usage of staff PPE, and extensive cleaning of exam room while observing appropriate contact time as indicated for disinfecting solutions.    Signed Lamar Blinks, MD   Received her labs as below, message to patient  I did a bit more digging about COVID-19 vaccines and platelet/bleeding disorders It does appear that the mRNA vaccines-Pfizer and Moderna-may be preferred in your situation.  I found the following on up-to-date:   History of thromboembolic disease is not a contraindication to vaccination.  However, very rare cases of unusual types of thrombosis associated with  thrombocytopenia have been reported following vaccination with both ChadOx1  nCoV-19/AZD1222 (AstraZeneca COVID-19 vaccine) and Ad26.COV2.S (Janssen COVID-19 vaccine, also referred to as the [employer] &Johnson vaccine). Because of  similarities between these events and immune-mediated, heparin-induced  thrombocytopenia (HIT), the CDC suggests that individuals with a syndrome of  thrombosis and  thrombocytopenia (such as HIT) within the prior 90 days receive  an mRNA vaccine rather than Ad26.COV2.S  There has not been a concerning signal for this type of thrombotic complication  with mRNA SKAJG-81 vaccines. Furthermore, there is no evidence that classic risk  factors for thrombosis (eg, thrombophilic disorders or prior history of venous  thromboembolism not associated with thrombocytopenia) increase the risk for this  rare adverse event and individuals with these can receive any authorized COVID-19 vaccine.  Also, here are your labs:   Blood counts are normal Metabolic profile normal  Take care, please see me in about 6 months- JC  addnd 7/28- hep C negative as expected  Results for orders placed or performed in visit on 05/29/20  Hepatitis C antibody  Result Value Ref Range   Hepatitis C Ab NON-REACTIVE NON-REACTI   SIGNAL TO CUT-OFF 0.01 <1.00  CBC  Result Value Ref Range   WBC 5.9 4.0 - 10.5 K/uL   RBC 3.97 3.87 - 5.11 Mil/uL   Platelets 297.0 150 - 400 K/uL   Hemoglobin 12.0 12.0 - 15.0 g/dL   HCT 35.9 (L) 36 - 46 %   MCV 90.2 78.0 - 100.0 fl   MCHC 33.5 30.0 - 36.0 g/dL   RDW 13.7 11.5 - 15.5 %  Comprehensive metabolic panel  Result Value Ref Range   Sodium 140 135 - 145 mEq/L   Potassium 4.3 3.5 - 5.1 mEq/L   Chloride 105 96 - 112 mEq/L   CO2 30 19 - 32 mEq/L   Glucose, Bld 82 70 - 99 mg/dL   BUN 13 6 - 23 mg/dL   Creatinine, Ser 0.64 0.40 - 1.20 mg/dL   Total Bilirubin 0.4 0.2 - 1.2 mg/dL   Alkaline Phosphatase 73 39 - 117 U/L   AST 13 0 - 37 U/L   ALT 9 0 - 35 U/L   Total Protein 7.0 6.0 - 8.3 g/dL   Albumin 4.2 3.5 - 5.2 g/dL   GFR 97.42 >60.00 mL/min   Calcium 9.3 8.4 - 10.5 mg/dL

## 2020-05-30 ENCOUNTER — Telehealth: Payer: Self-pay | Admitting: Hematology and Oncology

## 2020-05-30 LAB — HEPATITIS C ANTIBODY
Hepatitis C Ab: NONREACTIVE
SIGNAL TO CUT-OFF: 0.01 (ref <1.00)

## 2020-05-30 NOTE — Telephone Encounter (Signed)
Received referrals for genetics and hematology. Deanna [name] has been cld and scheduled to see Dr. Alvy Bimler for platelet dysfunction on 8/9 at 220pm and genetics at 1pm on the same day. Pt aware to arrive 15 minutes early.

## 2020-06-12 ENCOUNTER — Encounter: Payer: Self-pay | Admitting: Hematology and Oncology

## 2020-06-12 ENCOUNTER — Inpatient Hospital Stay (HOSPITAL_BASED_OUTPATIENT_CLINIC_OR_DEPARTMENT_OTHER): Payer: No Typology Code available for payment source | Admitting: Genetic Counselor

## 2020-06-12 ENCOUNTER — Other Ambulatory Visit: Payer: Self-pay | Admitting: Hematology and Oncology

## 2020-06-12 ENCOUNTER — Other Ambulatory Visit: Payer: Self-pay

## 2020-06-12 ENCOUNTER — Inpatient Hospital Stay: Payer: No Typology Code available for payment source

## 2020-06-12 ENCOUNTER — Encounter: Payer: Self-pay | Admitting: Genetic Counselor

## 2020-06-12 ENCOUNTER — Inpatient Hospital Stay
Payer: No Typology Code available for payment source | Attending: Hematology and Oncology | Admitting: Hematology and Oncology

## 2020-06-12 DIAGNOSIS — K068 Other specified disorders of gingiva and edentulous alveolar ridge: Secondary | ICD-10-CM

## 2020-06-12 DIAGNOSIS — D699 Hemorrhagic condition, unspecified: Secondary | ICD-10-CM

## 2020-06-12 DIAGNOSIS — Z299 Encounter for prophylactic measures, unspecified: Secondary | ICD-10-CM | POA: Diagnosis not present

## 2020-06-12 DIAGNOSIS — Z803 Family history of malignant neoplasm of breast: Secondary | ICD-10-CM | POA: Insufficient documentation

## 2020-06-12 DIAGNOSIS — Z800 Family history of malignant neoplasm of digestive organs: Secondary | ICD-10-CM

## 2020-06-12 NOTE — Progress Notes (Signed)
REFERRING PROVIDER: Darreld Mclean, MD [address] [place],  [place] [address]  PRIMARY PROVIDER:  Copland, Gay Filler, MD  PRIMARY REASON FOR VISIT:  1. Family history of colon cancer   2. Family history of breast cancer     HISTORY OF PRESENT ILLNESS:   Ms. Novell, a 54 y.o. female, was seen for a [place] cancer genetics consultation at the request of Dr. Lorelei Pont due to a personal history of colon and breast cancer.  Ms. Condrey presents to clinic today to discuss the possibility of a hereditary predisposition to cancer, genetic testing, and to further clarify her future cancer risks, as well as potential cancer risks for family members.    Ms. Sokolow is a 54 y.o. female with a personal history of dysplastic nevus vs. melanoma removed from her breast.   RISK FACTORS:  Menarche was at age 21.  First live birth at age 21.  OCP use for approximately 10 years.  Ovaries intact: yes.  Hysterectomy: yes; early 63s Menopausal status: postmenopausal.  HRT use: 0 years. Colonoscopy: yes; receives colonoscopies every 5 years; most recent in 2015 where one adenomatous polyp was detected. Mammogram within the last year: yes (10/2019) Number of breast biopsies: 1. Up to date with pelvic exams: yes. Other cancer screening: dermatology annually    Past Medical History:  Diagnosis Date  . Bleeding disorder ([place]) 08/31/2013  . Bleeding gums   . Clotting disorder ([place])   . Dysplastic nevus    followed yearly by [facility].  . Family history of breast cancer   . Family history of colon cancer   . Family history of malignant neoplasm of digestive organ   . Hx of adenomatous polyp of colon 09/21/2014  . Hx of transfusion of packed red blood cells   . Restless legs syndrome (RLS) 05/18/2014  . Spinal stenosis of lumbar region 09/22/2015  . Thyroid disease    Post-partum  . Vertigo, intermittent 01/09/2015    Past Surgical History:  Procedure Laterality  Date  . BREAST BIOPSY Left    Stereotactic needle biopsy-benign  . BREAST SURGERY Left 2007   removal of a severely dysplastic nevus vs. melanoma  . NASAL FRACTURE SURGERY  1995   post-op hemorrhage  . PARTIAL HYSTERECTOMY  11/04/2004   DUB/adenomyosis. Ovaries intact.  [name]  . resection dysplastic nevus  2012    Social History   Socioeconomic History  . Marital status: Divorced    Spouse name: Not on file  . Number of children: 2  . Years of education: AS  . Highest education level: Not on file  Occupational History    Employer: [employer]    CommentCareers adviser for hospitalist  Tobacco Use  . Smoking status: Never Smoker  . Smokeless tobacco: Never Used  Substance and Sexual Activity  . Alcohol use: Yes    Comment: 2 drinks per week   . Drug use: No  . Sexual activity: Yes    Birth control/protection: None  Other Topics Concern  . Not on file  Social History Narrative   Marital status: married x 23 years; happily married; no abuse.      Children: 2 children (19, 17); no grandchildren.      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      Tobacco:  None      Alcohol: none      Drugs: none      Exercise:  Walking, strength training  daily.      Seatbelt: 100%      Guns: none       Sunscreen:  SPF 30.   Patient is right handed.   Patient drinks 2 cups caffeine daily.   Social Determinants of Health   Financial Resource Strain:   . Difficulty of Paying Living Expenses:   Food Insecurity:   . Worried About Charity fundraiser in the Last Year:   . Arboriculturist in the Last Year:   Transportation Needs:   . Film/video editor (Medical):   Marland Kitchen Lack of Transportation (Non-Medical):   Physical Activity:   . Days of Exercise per Week:   . Minutes of Exercise per Session:   Stress:   . Feeling of Stress :   Social Connections:   . Frequency of Communication with Friends and Family:   . Frequency of Social Gatherings with Friends and  Family:   . Attends Religious Services:   . Active Member of Clubs or Organizations:   . Attends Archivist Meetings:   Marland Kitchen Marital Status:      FAMILY HISTORY:  We obtained a detailed, 4-generation family history.  Significant diagnoses are listed below: Family History  Problem Relation Age of Onset  . Colon cancer Mother 21  . Diabetes Mother   . Hypertension Mother   . Dementia Mother   . Stroke Father 33  . Hypertension Brother   . Heart disease Brother   . Thyroid disease Sister   . Colon cancer Sister 37  . COPD Sister   . Heart disease Sister 63       AMI  . Cancer Brother 57       esophageal  . Cancer Brother 21       bone cancer  . Heart disease Brother        multiple AMI  . Esophageal cancer Maternal Uncle        dx 19s  . Breast cancer Sister        dx early 54s       Ms. Moomaw has one daughter and one son, both without a history of cancer.  She has one full brother, age 29, and two full sisters.  One of her full sisters passed away at 39 after being diagnosed with colon cancer at age 71.  Her other full sister, now 61 years old, was diagnosed with breast cancer in her early 43s.  She has several paternal half siblings.  One paternal half brother was diagnosed with reported bone cancer and passed away in his late 45s.  Another paternal half brother was diagnosed with esophageal cancer in his late 8s.  Ms. Eliane Decree reported a paternal aunt with leukemia who passed away at a "young age."  Ms. Jordan's mother passed away at age 55 after being diagnosed with colon cancer at age 47.  Ms. Rone had a maternal uncle who passed away from esophageal cancer in his late 37s.  She also had a distant maternal cousin diagnosed who passed away from brain cancer around the age of 76.  No other family history of cancer was reported.   Ms. Boss is unaware of previous family history of genetic testing for hereditary cancer risks. Patient's maternal ancestors are of  [place] descent, and paternal ancestors are of Irish/European descent. There is no reported Ashkenazi Jewish ancestry. There is no known consanguinity.  GENETIC COUNSELING ASSESSMENT: Ms. Gatliff is a 54 y.o. female with a family history  of colon cancer which is somewhat suggestive of a hereditary cancer syndrome, such as Lynch syndrome, and predisposition to cancer given the age at which her sister was diagnosed with colon cancer and multiple cases of colon cancer in her family. We, therefore, discussed and recommended the following at today's visit.   DISCUSSION: We discussed that 5 - 10% of cancer is hereditary, with many cases of hereditary colon cancer associated with Lynch syndrome, caused by mutations in MLH1, MSH2, MSH6, PMS2, and EPCAM.  There are other genes that can be associated with hereditary cancer syndromes.  Type of cancer risk and level of risk are gene-specific.  We discussed that testing is beneficial for several reasons, including knowing about other cancer risks, identifying potential screening and risk-reduction options that may be appropriate, and to understand if other family members could be at risk for cancer and allow them to undergo genetic testing.  We reviewed the characteristics, features and inheritance patterns of hereditary cancer syndromes. We also discussed genetic testing, including the appropriate family members to test, the process of testing, insurance coverage and turn-around-time for results. We discussed the implications of a negative, positive, carrier and/or variant of uncertain significant result. We discussed that negative results would be uninformative given that Ms. Jarema does not have a personal history of cancer. We recommended Ms. Tomaro pursue genetic testing for a panel that contains genes associated with colon cancer.  Ms. Pirro was offered a common hereditary cancer panel (48 genes) and an expanded pan-cancer panel (85 genes). Ms. Danziger was  informed of the benefits and limitations of each panel, including that expanded pan-cancer panels contain several preliminary evidence genes that do not have clear management guidelines at this point in time.  We also discussed that as the number of genes included on a panel increases, the chances of variants of uncertain significance increases.  After considering the benefits and limitations of each gene panel, Ms. Pavelko elected to have an expanded Radio broadcast assistant through [facility].  A Lynch syndrome panel (MLH1, MSH2, MSH6, PMS2, and EPCAM) will be ordered for Ms. Whitson with a reflex to the expanded cancer panel regardless of initial results.    The Multi-Cancer Panel offered by [facility] includes sequencing and/or deletion duplication testing of the following 85 genes: AIP, ALK, APC, ATM, AXIN2,BAP1,  BARD1, BLM, BMPR1A, BRCA1, BRCA2, BRIP1, CASR, CDC73, CDH1, CDK4, CDKN1B, CDKN1C, CDKN2A (p14ARF), CDKN2A (p16INK4a), CEBPA, CHEK2, CTNNA1, DICER1, DIS3L2, EGFR (c.2369C>T, p.Thr790Met variant only), EPCAM (Deletion/duplication testing only), FH, FLCN, GATA2, GPC3, GREM1 (Promoter region deletion/duplication testing only), HOXB13 (c.251G>A, p.Gly84Glu), HRAS, KIT, MAX, MEN1, MET, MITF (c.952G>A, p.Glu318Lys variant only), MLH1, MSH2, MSH3, MSH6, MUTYH, NBN, NF1, NF2, NTHL1, PALB2, PDGFRA, PHOX2B, PMS2, POLD1, POLE, POT1, PRKAR1A, PTCH1, PTEN, RAD50, RAD51C, RAD51D, RB1, RECQL4, RET, RNF43, RUNX1, SDHAF2, SDHA (sequence changes only), SDHB, SDHC, SDHD, SMAD4, SMARCA4, SMARCB1, SMARCE1, STK11, SUFU, TERC, TERT, TMEM127, TP53, TSC1, TSC2, VHL, WRN and WT1.    Based on Ms. Buresh's family history of cancer, she meets medical NCCN criteria for genetic testing for Lynch syndrome. Despite that she meets criteria, she may still have an out of pocket cost. We discussed that if her out of pocket cost for testing is over $100, the laboratory will call and confirm whether she wants to proceed with testing.  If the out of  pocket cost of testing is less than $100 she will be billed by the genetic testing laboratory.   We discussed that some people do not want to undergo genetic testing  due to fear of genetic discrimination.  A federal law called the Genetic Information Non-Discrimination Act (GINA) of 2008 helps protect individuals against genetic discrimination based on their genetic test results.  It impacts both health insurance and employment.  With health insurance, it protects against increased premiums, being kicked off insurance or being forced to take a test in order to be insured.  For employment it protects against hiring, firing and promoting decisions based on genetic test results.  GINA does not apply to those in the [employer], those who work for companies with less than 15 employees, and new life insurance or long-term disability insurance policies.  Health status due to a cancer diagnosis is not protected under GINA.  PLAN: After considering the risks, benefits, and limitations, Ms. Filla provided informed consent to pursue genetic testing and the blood sample was sent to [facility] for analysis of the Lynch Syndrome Panel with reflex to the Multi-Cancer Panel. Results should be available within approximately 3 weeks' time, at which point they will be disclosed by telephone to Ms. Sloan, as will any additional recommendations warranted by these results. Ms. Amberg will receive a summary of her genetic counseling visit and a copy of her results once available. This information will also be available in Epic.   Based on Ms. Mayhall's family history, we recommended her full siblings, who was diagnosed have genetic counseling and testing, given the family history of colon cancer before the age of 21. Ms. Costin will let us know if we can be of any assistance in coordinating genetic counseling and/or testing for this family member.   Lastly, we encouraged Ms. Ranes to remain in contact with cancer  genetics annually so that we can continuously update the family history and inform her of any changes in cancer genetics and testing that may be of benefit for this family.   Ms. Bari questions were answered to her satisfaction today. Our contact information was provided should additional questions or concerns arise. Thank you for the referral and allowing [place] to share in the care of your patient.   Shakeera Rightmyer M. Joette Catching, [email or web address] (P) [phone]   The patient was seen for a total of 42 minutes in face-to-face genetic counseling.  This patient was discussed with Drs. Magrinat, Lindi Adie and/or Burr Medico who agrees with the above.    _______________________________________________________________________ For Office Staff:  Number of people involved in session: 1 Was an Intern/ student involved with case: yes; prospective genetic counseling student, Lequita Asal, observed this session

## 2020-06-13 ENCOUNTER — Encounter: Payer: Self-pay | Admitting: Genetic Counselor

## 2020-06-13 ENCOUNTER — Encounter: Payer: Self-pay | Admitting: Hematology and Oncology

## 2020-06-13 DIAGNOSIS — Z299 Encounter for prophylactic measures, unspecified: Secondary | ICD-10-CM | POA: Insufficient documentation

## 2020-06-13 NOTE — Assessment & Plan Note (Signed)
She has chronic gum bleeding on a regular basis when she brushes her teeth or when she flosses I recommend gentle flossing with WaterPik only She is concerned about receding gum Recommend consideration for expert opinion at [facility] for further evaluation and recommendation

## 2020-06-13 NOTE — Assessment & Plan Note (Signed)
The patient contracted COVID-19 in January 2021 and has not received her vaccination to date She is concerned about adverse effect of Covid vaccination in the setting of her bleeding disorder I shared with her the data that hematological complication in the form of excessive bleeding from COVID-19 vaccination is very rare On the other hand, risk of thrombotic events if she has recurrent Covid infection is high I recommend her to proceed with Covid vaccination because the risk of adverse effects is low but the benefits outweighs the risk in her situation She is undecided

## 2020-06-13 NOTE — Progress Notes (Signed)
[place] progress notes  Patient Care Team: Copland, Gay Filler, MD as PCP - General (Family Medicine)  CHIEF COMPLAINTS/PURPOSE OF VISIT:  Storage pool disorder  HISTORY OF PRESENTING ILLNESS:  Deanna [name] 54 y.o. female was last seen in 2014. She is billed as a new patient today because he has been more than 3 years since last time I saw her She was referred to [facility] for further evaluation and was subsequently diagnosed with storage pool disorder She is referred back due to chronic gum bleeding. She has bleeding almost on a daily basis after she brushes her teeth It is also excessive after dental cleaning or flossing She was told by her dentist that her gum is receding and she is concerned about losing all her teeth Apart from that, she denies other forms of bleeding The patient denies any recent signs or symptoms of bleeding such as spontaneous epistaxis, hematuria or hematochezia. She is also concerned about getting Covid vaccination.  She contracted Covid infection in January 2021  I reviewed the patient's records extensive and collaborated the history with the patient. Summary of her history is as follows: The patient has been complaining of a bad tooth on the left upper molar requiring surgical extraction in the fall of 2014 She had excessive bleeding tendency since early in age. She always had heavy menstruation needing a partial hysterectomy.  Surprisingly she did not have excessive bleeding afterwards. The hysterectomy was done in a laparoscopic manner The patient also have nasal fracture in her 28s requiring blood transfusion She had removal of the skin cancer over the right breast area with excessive bleeding afterwards She has no family history of bleeding disorder.  She was evaluated by Dr. Gaylyn Cheers from [facility]: panel of evaluation from 2015 showed normal von Willebrand panel, abnormal platelet function assay, normal platelet aggregation  studies and decreased alpha dense granules on electron microscopy. 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 Electron microscopy showed decreased alpha and dense granules consistent with a platelet storage pool disorder  DDAVP trial was performed at [facility] resulting in improvement in platelet function assay (although PFA was actually normal range prior to test). She was recommended and prescribed Stimate to be used prior to minor procedures or in the event of bleeding at home   MEDICAL HISTORY:  Past Medical History:  Diagnosis Date  . Bleeding disorder ([place]) 08/31/2013  . Bleeding gums   . Clotting disorder ([place])   . Dysplastic nevus    followed yearly by [facility].  . Family history of breast cancer   . Family history of colon cancer   . Family history of malignant neoplasm of digestive organ   . Hx of adenomatous polyp of colon 09/21/2014  . Hx of transfusion of packed red blood cells   . Restless legs syndrome (RLS) 05/18/2014  . Spinal stenosis of lumbar region 09/22/2015  . Thyroid disease    Post-partum  . Vertigo, intermittent 01/09/2015    SURGICAL HISTORY: Past Surgical History:  Procedure Laterality Date  . BREAST BIOPSY Left    Stereotactic needle biopsy-benign  . BREAST SURGERY Left 2007   removal of a severely dysplastic nevus vs. melanoma  . NASAL FRACTURE SURGERY  1995   post-op hemorrhage  . PARTIAL HYSTERECTOMY  11/04/2004   DUB/adenomyosis. Ovaries intact.  Deanna [name].  . resection dysplastic nevus  2012    SOCIAL HISTORY: Social History   Socioeconomic History  . Marital status: Divorced  Spouse name: Not on file  . Number of children: 2  . Years of education: AS  . Highest education level: Not on file  Occupational History    Employer: [employer]    CommentCareers adviser for hospitalist  Tobacco Use  . Smoking status: Never Smoker  . Smokeless tobacco: Never Used  Substance and Sexual Activity  . Alcohol  use: Yes    Comment: 2 drinks per week   . Drug use: No  . Sexual activity: Yes    Birth control/protection: None  Other Topics Concern  . Not on file  Social History Narrative   Marital status: married x 23 years; happily married; no abuse.      Children: 2 children (19, 17); no grandchildren.      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      Tobacco:  None      Alcohol: none      Drugs: none      Exercise:  Walking, strength training daily.      Seatbelt: 100%      Guns: none       Sunscreen:  SPF 30.   Patient is right handed.   Patient drinks 2 cups caffeine daily.   Social Determinants of Health   Financial Resource Strain:   . Difficulty of Paying Living Expenses:   Food Insecurity:   . Worried About Charity fundraiser in the Last Year:   . Arboriculturist in the Last Year:   Transportation Needs:   . Film/video editor (Medical):   Marland Kitchen Lack of Transportation (Non-Medical):   Physical Activity:   . Days of Exercise per Week:   . Minutes of Exercise per Session:   Stress:   . Feeling of Stress :   Social Connections:   . Frequency of Communication with Friends and Family:   . Frequency of Social Gatherings with Friends and Family:   . Attends Religious Services:   . Active Member of Clubs or Organizations:   . Attends Archivist Meetings:   Marland Kitchen Marital Status:   Intimate Partner Violence:   . Fear of Current or Ex-Partner:   . Emotionally Abused:   Marland Kitchen Physically Abused:   . Sexually Abused:     FAMILY HISTORY: Family History  Problem Relation Age of Onset  . Colon cancer Mother 61  . Diabetes Mother   . Hypertension Mother   . Dementia Mother   . Stroke Father 40  . Hypertension Brother   . Heart disease Brother   . Thyroid disease Sister   . Colon cancer Sister 60  . COPD Sister   . Heart disease Sister 28       AMI  . Cancer Brother 68       esophageal  . Cancer Brother 35       bone cancer  . Heart disease  Brother        multiple AMI  . Esophageal cancer Maternal Uncle        dx 92s  . Breast cancer Sister        dx early 60s    ALLERGIES:  has No Known Allergies.  MEDICATIONS:  Current Outpatient Medications  Medication Sig Dispense Refill  . Cholecalciferol (VITAMIN D3) 1000 units CAPS Take 5,000 mg by mouth daily.    . Aminocaproic Acid 1000 MG TABS Take 2 tablets (2,000 mg total) by mouth 2 (two) times daily. Take for one week  for surgcial procedures 60 tablet 0   No current facility-administered medications for this visit.    REVIEW OF SYSTEMS:   Constitutional: Denies fevers, chills or abnormal night sweats Eyes: Denies blurriness of vision, double vision or watery eyes Ears, nose, mouth, throat, and face: Denies mucositis or sore throat Respiratory: Denies cough, dyspnea or wheezes Cardiovascular: Denies palpitation, chest discomfort or lower extremity swelling Gastrointestinal:  Denies nausea, heartburn or change in bowel habits Skin: Denies abnormal skin rashes Lymphatics: Denies new lymphadenopathy or easy bruising Neurological:Denies numbness, tingling or new weaknesses Behavioral/Psych: Mood is stable, no new changes  All other systems were reviewed with the patient and are negative.  PHYSICAL EXAMINATION: ECOG PERFORMANCE STATUS: 0 - Asymptomatic  Vitals:   06/12/20 1423  BP: 118/60  Pulse: 64  Resp: 18  Temp: 98.2 F (36.8 C)  SpO2: 100%   Filed Weights   06/12/20 1423  Weight: 177 lb 9.6 oz (80.6 kg)    GENERAL:alert, no distress and comfortable NEURO: no focal motor/sensory deficits  LABORATORY DATA:  I have reviewed the data as listed Lab Results  Component Value Date   WBC 5.9 05/29/2020   HGB 12.0 05/29/2020   HCT 35.9 (L) 05/29/2020   MCV 90.2 05/29/2020   PLT 297.0 05/29/2020   Recent Labs    10/21/19 1332 05/29/20 1156  NA 139 140  K 3.9 4.3  CL 101 105  CO2 31 30  GLUCOSE 71 82  BUN 13 13  CREATININE 0.71 0.64  CALCIUM 9.6  9.3  PROT 7.8 7.0  ALBUMIN 4.5 4.2  AST 13 13  ALT 10 9  ALKPHOS 90 73  BILITOT 0.3 0.4    ASSESSMENT & PLAN:  Bleeding disorder ([place]) She has very rare storage pool disorder My expertise in this condition is limited I wrote her a letter to give to her insurance company to support tertiary referral to [facility] back to Dr. Gaylyn Cheers, who is out of network provider but is an expert in this field for further follow-up I do not believe her chronic gum bleeding requires regular use of DDAVP/Stimate I think the use of aminocaproic acid prior to dental cleaning is reasonable use I do not recommend prophylactic surgery without consulting myself or Dr. Gaylyn Cheers first in the future I recommend close follow-up every 3 years or so to get updated recommendation about research and treatment in the future regarding this rare bleeding disorder   Bleeding gums She has chronic gum bleeding on a regular basis when she brushes her teeth or when she flosses I recommend gentle flossing with WaterPik only She is concerned about receding gum Recommend consideration for expert opinion at [facility] for further evaluation and recommendation  Preventive measure The patient contracted COVID-19 in January 2021 and has not received her vaccination to date She is concerned about adverse effect of Covid vaccination in the setting of her bleeding disorder I shared with her the data that hematological complication in the form of excessive bleeding from COVID-19 vaccination is very rare On the other hand, risk of thrombotic events if she has recurrent Covid infection is high I recommend her to proceed with Covid vaccination because the risk of adverse effects is low but the benefits outweighs the risk in her situation She is undecided   No orders of the defined types were placed in this encounter.   All questions were answered. The patient knows to call the clinic with any problems, questions or concerns. The total time spent in the  appointment was 55 minutes encounter with patients including review of chart and various tests results, discussions about plan of care and coordination of care plan   Heath Lark, MD 06/13/2020 7:43 AM

## 2020-06-13 NOTE — Assessment & Plan Note (Signed)
She has very rare storage pool disorder My expertise in this condition is limited I wrote her a letter to give to her insurance company to support tertiary referral to [facility] back to Dr. Gaylyn Cheers, who is out of network provider but is an expert in this field for further follow-up I do not believe her chronic gum bleeding requires regular use of DDAVP/Stimate I think the use of aminocaproic acid prior to dental cleaning is reasonable use I do not recommend prophylactic surgery without consulting myself or Dr. Gaylyn Cheers first in the future I recommend close follow-up every 3 years or so to get updated recommendation about research and treatment in the future regarding this rare bleeding disorder

## 2020-06-15 MED FILL — TRANEXAMIC ACID 650 MG TAB: 650 | 10 days supply | Qty: 60 | Fill #0

## 2020-06-19 ENCOUNTER — Other Ambulatory Visit: Payer: Self-pay | Admitting: Primary Care

## 2020-06-19 ENCOUNTER — Other Ambulatory Visit: Payer: No Typology Code available for payment source

## 2020-06-19 DIAGNOSIS — Z0184 Encounter for antibody response examination: Secondary | ICD-10-CM

## 2020-06-19 NOTE — Progress Notes (Signed)
Patient would like results sent to her in mychart please

## 2020-06-20 ENCOUNTER — Encounter: Payer: Self-pay | Admitting: Genetic Counselor

## 2020-06-20 ENCOUNTER — Ambulatory Visit: Payer: Self-pay | Admitting: Genetic Counselor

## 2020-06-20 ENCOUNTER — Telehealth: Payer: Self-pay | Admitting: Genetic Counselor

## 2020-06-20 DIAGNOSIS — Z1379 Encounter for other screening for genetic and chromosomal anomalies: Secondary | ICD-10-CM | POA: Insufficient documentation

## 2020-06-20 DIAGNOSIS — Z800 Family history of malignant neoplasm of digestive organs: Secondary | ICD-10-CM

## 2020-06-20 DIAGNOSIS — Z803 Family history of malignant neoplasm of breast: Secondary | ICD-10-CM

## 2020-06-20 LAB — SARS-COV-2 ANTIBODY(IGG)SPIKE,SEMI-QUANTITATIVE: SARS COV1 AB(IGG)SPIKE,SEMI QN: 3.04 index — ABNORMAL HIGH (ref <1.00)

## 2020-06-20 NOTE — Progress Notes (Signed)
HPI:  Deanna [name] was previously seen in the [facility] due to a personal and family history of breast and colon cancer and concerns regarding a hereditary predisposition to cancer. Please refer to our prior cancer genetics clinic note for more information regarding our discussion, assessment and recommendations, at the time. Deanna [name] recent genetic test results were disclosed to her, as were recommendations warranted by these results. These results and recommendations are discussed in more detail below.  CANCER HISTORY:  Deanna [name] does not have a personal history of cancer.  FAMILY HISTORY:  We obtained a detailed, 4-generation family history.  Significant diagnoses are listed below: Family History  Problem Relation Age of Onset   Colon cancer Mother 19   Diabetes Mother    Hypertension Mother    Dementia Mother    Stroke Father 43   Hypertension Brother    Heart disease Brother    Thyroid disease Sister    Colon cancer Sister 37   COPD Sister    Heart disease Sister 61       AMI   Heart disease Brother        multiple AMI   Esophageal cancer Maternal Uncle        dx 87s   Breast cancer Sister        dx early 51s   Bone cancer Half-Brother        paternal; d. late 8s   Esophageal cancer Half-Brother        paternal; d. late 54s       Deanna [name] has one daughter and one son, both without a history of cancer.  She has one full brother, age 84, and two full sisters.  One of her full sisters passed away at 65 after being diagnosed with colon cancer at age 38.  Her other full sister, now 36 years old, was diagnosed with breast cancer in her early 78s.  She has several paternal half siblings.  One paternal half brother was diagnosed with reported bone cancer and passed away in his late 82s.  Another paternal half brother was diagnosed with esophageal cancer in his late 57s.  Deanna [name] reported a paternal aunt with leukemia who  passed away at a "young age."  Deanna [name]'s mother passed away at age 49 after being diagnosed with colon cancer at age 30.  Deanna [name] had a maternal uncle who passed away from esophageal cancer in his late 67s.  She also had a distant maternal cousin diagnosed who passed away from brain cancer around the age of 31.  No other family history of cancer was reported.   Deanna [name] is unaware of previous family history of genetic testing for hereditary cancer risks. Patient's maternal ancestors are of [place] descent, and paternal ancestors are of Irish/European descent. There is no reported Ashkenazi Jewish ancestry. There is no known consanguinity.  GENETIC TEST RESULTS: Genetic testing reported out on June 18, 2020 through the [facility] Multi-Cancer Panel found no pathogenic mutations. The Multi-Cancer Panel offered by [facility] includes sequencing and/or deletion duplication testing of the following 85 genes: AIP, ALK, APC, ATM, AXIN2,BAP1,  BARD1, BLM, BMPR1A, BRCA1, BRCA2, BRIP1, CASR, CDC73, CDH1, CDK4, CDKN1B, CDKN1C, CDKN2A (p14ARF), CDKN2A (p16INK4a), CEBPA, CHEK2, CTNNA1, DICER1, DIS3L2, EGFR (c.2369C>T, p.Thr790Met variant only), EPCAM (Deletion/duplication testing only), FH, FLCN, GATA2, GPC3, GREM1 (Promoter region deletion/duplication testing only), HOXB13 (c.251G>A, p.Gly84Glu), HRAS, KIT, MAX, MEN1, MET, MITF (c.952G>A, p.Glu318Lys variant only), MLH1, MSH2, MSH3, MSH6, MUTYH, NBN, NF1,  NF2, NTHL1, PALB2, PDGFRA, PHOX2B, PMS2, POLD1, POLE, POT1, PRKAR1A, PTCH1, PTEN, RAD50, RAD51C, RAD51D, RB1, RECQL4, RET, RNF43, RUNX1, SDHAF2, SDHA (sequence changes only), SDHB, SDHC, SDHD, SMAD4, SMARCA4, SMARCB1, SMARCE1, STK11, SUFU, TERC, TERT, TMEM127, TP53, TSC1, TSC2, VHL, WRN and WT1. The test report has been scanned into EPIC and is located under the Molecular Pathology section of the Results Review tab.  A portion of the result report is included below for reference.     We discussed with Ms.  [name] that because current genetic testing is not perfect, it is possible there may be a gene mutation in one of these genes that current testing cannot detect, but that chance is small.  We also discussed, that there could be another gene that has not yet been discovered, or that we have not yet tested, that is responsible for the cancer diagnoses in the family. It is also possible there is a hereditary cause for the cancer in the family that Deanna [name] did not inherit and therefore was not identified in her testing.  Therefore, it is important to remain in touch with cancer genetics in the future so that we can continue to offer Deanna [name] the most up to date genetic testing.   ADDITIONAL GENETIC TESTING: We discussed with Deanna [name] that her genetic testing was fairly extensive.  If there are genes identified to increase cancer risk that can be analyzed in the future, we would be happy to discuss and coordinate this testing at that time.    CANCER SCREENING RECOMMENDATIONS: Deanna [name] test result is considered negative (normal).  This means that we have not identified a hereditary cause for her family history of breast and colon cancer at this time. Most cancers happen by chance and this negative test suggests that her family's cancer could fall into this category.   While reassuring, this does not definitively rule out a hereditary predisposition to cancer. It is still possible that there could be genetic mutations that are undetectable by current technology. There could be genetic mutations in genes that have not been tested or identified to increase cancer risk.  Therefore, it is recommended she continue to follow the cancer management and screening guidelines provided by her gastroenterologist and primary healthcare provider. Given the family hisotry of colon cancer in a first-degree relative, Deanna [name] should receive colonoscopies at least every 5 years, or as recommended by her  gastroenterologist.   An individual's cancer risk and medical management are not determined by genetic test results alone. Overall cancer risk assessment incorporates additional factors, including personal medical history, family history, and any available genetic information that may result in a personalized plan for cancer prevention and surveillance  RECOMMENDATIONS FOR FAMILY MEMBERS:  Individuals in this family might be at some increased risk of developing cancer, over the general population risk, simply due to the family history of cancer.  We recommended women in this family have a yearly mammogram beginning at age 28, or 67 years younger than the earliest onset of cancer, an annual clinical breast exam, and perform monthly breast self-exams. Women in this family should also have a gynecological exam as recommended by their primary provider. All family members should be referred for colonoscopy starting at age 58.  First degree relatives of those with colon cancer should receive colonoscopies beginning at age 85, or 10 years prior to the earliest diagnosis of colon cancer in the family, and receive colonoscopies at least every 5 years or as recommended by their  gastroenterologist.    It is also possible there is a hereditary cause for the cancer in Deanna [name] family that she did not inherit and therefore was not identified in her.  Based on Deanna [name]'s family history, we recommended her siblings and the son of her sister who passed away from colon cancer have genetic counseling and testing. Deanna [name] will let us know if we can be of any assistance in coordinating genetic counseling and/or testing for this family member.   FOLLOW-UP: Lastly, we discussed with Ms. Verdell that cancer genetics is a rapidly advancing field and it is possible that new genetic tests will be appropriate for her and/or her family members in the future. We encouraged her to remain in contact with cancer genetics on an  annual basis so we can update her personal and family histories and let her know of advances in cancer genetics that may benefit this family.   Our contact number was provided. Ms. Gose questions were answered to her satisfaction, and she knows she is welcome to call us at anytime with additional questions or concerns.   Ambera Fedele M. Joette Catching, [email or web address] (P) [phone]

## 2020-06-20 NOTE — Telephone Encounter (Signed)
Revealed negative genetic testing for [facility] Multi-Cancer Panel.  Discussed that we do not know why there is cancer in the family. It could be due to a different gene that we are not testing, that she did not inherit a familial variant, or that her family is truly negative.  It will be important for her to keep in contact with genetics to keep up with whether additional testing may be needed.

## 2020-08-14 ENCOUNTER — Other Ambulatory Visit (HOSPITAL_COMMUNITY): Payer: Self-pay | Admitting: Internal Medicine

## 2020-08-14 MED FILL — SULFAMETHOXAZOLE-TMP SS TAB: 400-80 | 3 days supply | Qty: 6 | Fill #0

## 2020-08-15 NOTE — Progress Notes (Addendum)
[place] [facility] [employer] [place], [place], [place] [address] [phone]  Date:  08/16/2020   Name:  Deanna [name]   DOB:  06/21/1966   MRN:  [id number]  PCP:  Darreld Mclean, MD    Chief Complaint: possible uti-like symptoms (day 3- unrine frequency, bright red blood passing in urine)   History of Present Illness:  Deanna [name] is a 54 y.o. 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  Her condition has been stable, so she has not need to follow-up frequently.Her platelet numbers are normal, but she has platelet dysfunction and tends to bleed easily She is practice admin for [employer]  We had her see medical genetics this past summer due to family history of cancers.  Her genetic screening was negative fortunately Her sister has breast cancer  She awoke this past Monday am with UTI sx.  She was having hematuria, dysuria. No fever or chills, no atypical back pain.  One of her doctors at work called in septra BID for 3 days for her- she is finishing this up now.  She no longer has any visible hematuria  She is improved but not 100% well as of yet   She is s/p partial hysterectomy  She is in a new relationship right now which is going well We discussed some concerns with vaginal dryness and discomfort.  Using estrogen is possibly problematic due to ?increased cancer risk     Patient Active Problem List   Diagnosis Date Noted  . Genetic testing 06/20/2020  . Preventive measure 06/13/2020  . Family history of breast cancer   . Family history of colon cancer   . Spinal stenosis of lumbar region 09/22/2015  . Vertigo, intermittent 01/09/2015  . Hx of  adenomatous polyp of colon 09/21/2014  . Restless legs syndrome (RLS) 05/18/2014  . 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 Bleeding disorder ([place]) 08/31/2013  . Paresthesias 07/19/2013  . Hematuria 07/19/2013  . Family history of colon cancer requiring screening colonoscopy 04/12/2013  . Bleeding gums 04/12/2013  . Unspecified vitamin D deficiency 04/12/2013  . Palpitations 03/09/2013  . Bleeding 03/09/2013  . Thyroid disease   . Dysplastic nevus     Past Medical History:  Diagnosis Date  . Bleeding disorder ([place]) 08/31/2013  . Bleeding gums   . Clotting disorder ([place])   . Dysplastic nevus    followed yearly by [facility].  . Family history of breast cancer   . Family history of colon cancer   . Family history of malignant neoplasm of digestive organ   . Hx of adenomatous polyp of colon 09/21/2014  . Hx of transfusion of packed red blood cells   . Restless legs syndrome (RLS) 05/18/2014  . Spinal stenosis of lumbar region 09/22/2015  . Thyroid disease    Post-partum  . Vertigo, intermittent 01/09/2015    Past Surgical History:  Procedure Laterality Date  . BREAST BIOPSY Left    Stereotactic needle biopsy-benign  . BREAST SURGERY Left 2007   removal of a severely dysplastic nevus vs. melanoma  . NASAL FRACTURE SURGERY  1995   post-op hemorrhage  . PARTIAL  HYSTERECTOMY  11/04/2004   DUB/adenomyosis. Ovaries intact.  [name]  . resection dysplastic nevus  2012    Social History   Tobacco Use  . Smoking status: Never Smoker  . Smokeless tobacco: Never Used  Substance Use Topics  . Alcohol use: Yes    Comment: 2 drinks per week   . Drug use: No    Family History  Problem Relation Age of Onset  . Colon cancer Mother 61  . Diabetes Mother   . Hypertension Mother   . Dementia Mother   . Stroke Father 42  . Hypertension Brother   . Heart disease Brother   . Thyroid disease Sister   . Colon cancer Sister 4  . COPD Sister   . Heart disease Sister  55       AMI  . Heart disease Brother        multiple AMI  . Esophageal cancer Maternal Uncle        dx 61s  . Breast cancer Sister        dx early 65s  . Bone cancer Half-Brother        paternal; d. late 69s  . Esophageal cancer Half-Brother        paternal; d. late 45s    No Known Allergies  Medication list has been reviewed and updated.  Current Outpatient Medications on File Prior to Visit  Medication Sig Dispense Refill  . Sulfamethoxazole-Trimethoprim (SEPTRA PO) Take by mouth. Total of 6 tablet for 3 days (2 times a day for 3 days) pt. States unsure of how many mg.    . Aminocaproic Acid 1000 MG TABS Take 2 tablets (2,000 mg total) by mouth 2 (two) times daily. Take for one week for surgcial procedures 60 tablet 0  . Cholecalciferol (VITAMIN D3) 1000 units CAPS Take 5,000 mg by mouth daily.     No current facility-administered medications on file prior to visit.    Review of Systems: As per HPI- otherwise negative.   Physical Examination: Vitals:   08/16/20 1352  BP: 120/66  Pulse: 64  Resp: 12  Temp: 98 F (36.7 C)  SpO2: 98%   Vitals:   08/16/20 1352  Weight: 161 lb 9.6 oz (73.3 kg)  Height: 5\' 5"  (1.651 m)   Body mass index is 26.89 kg/m. Ideal Body Weight: Weight in (lb) to have BMI = 25: 149.9  GEN: no acute distress.  Normal weight, looks well  HEENT: Atraumatic, Normocephalic.  Ears and Nose: No external deformity. CV: RRR, No M/G/R. No JVD. No thrill. No extra heart sounds. PULM: CTA B, no wheezes, crackles, rhonchi. No retractions. No resp. distress. No accessory muscle use. ABD: S, NT, ND, +BS. No rebound. No HSM. Belly is benign, no CVA tenderness  EXTR: No c/c/e PSYCH: Normally interactive. Conversant.   Results for orders placed or performed in visit on 08/16/20  POCT urinalysis dipstick  Result Value Ref Range   Color, UA yellow yellow   Clarity, UA clear clear   Glucose, UA negative negative mg/dL   Bilirubin, UA negative negative    Ketones, POC UA negative negative mg/dL   Spec Grav, UA 1.020 1.010 - 1.025   Blood, UA negative negative   pH, UA 6.0 5.0 - 8.0   Protein Ur, POC negative negative mg/dL   Urobilinogen, UA negative (A) 0.2 or 1.0 E.U./dL   Nitrite, UA Negative Negative   Leukocytes, UA Negative Negative    Assessment and Plan: Urinary frequency - Plan:  Urine Culture, POCT urinalysis dipstick  Influenza vaccine needed - Plan: Flu Vaccine QUAD 6+ mos PF IM (Fluarix Quad PF)  Vaginal atrophy  Pt here today with possible UTI Urine culture pending. She just finished up 3 days of septra.  Will give her another 3 days as she is still somewhat symptomatic, await culture  She is experiencing sx of vaginal atrophy with painful intercourse. We discussed trying a low dose topical estrogen for her.  We discussed possible increased breast cancer risk with vaginal estrogen- thought to be low but more research is needed.  She will try non hormonal lubricants first  This visit occurred during the SARS-CoV-2 public health emergency.  Safety protocols were in place, including screening questions prior to the visit, additional usage of staff PPE, and extensive cleaning of exam room while observing appropriate contact time as indicated for disinfecting solutions.    Signed Lamar Blinks, MD  Received urine culture as below- message to pt  Results for orders placed or performed in visit on 08/16/20  Urine Culture   Specimen: Urine  Result Value Ref Range   MICRO NUMBER: [id number]    SPECIMEN QUALITY: Adequate    Sample Source NOT GIVEN    STATUS: FINAL    Result: No Growth   POCT urinalysis dipstick  Result Value Ref Range   Color, UA yellow yellow   Clarity, UA clear clear   Glucose, UA negative negative mg/dL   Bilirubin, UA negative negative   Ketones, POC UA negative negative mg/dL   Spec Grav, UA 1.020 1.010 - 1.025   Blood, UA negative negative   pH, UA 6.0 5.0 - 8.0   Protein Ur, POC negative  negative mg/dL   Urobilinogen, UA negative (A) 0.2 or 1.0 E.U./dL   Nitrite, UA Negative Negative   Leukocytes, UA Negative Negative

## 2020-08-16 ENCOUNTER — Encounter: Payer: Self-pay | Admitting: Family Medicine

## 2020-08-16 ENCOUNTER — Other Ambulatory Visit: Payer: Self-pay

## 2020-08-16 ENCOUNTER — Ambulatory Visit (INDEPENDENT_AMBULATORY_CARE_PROVIDER_SITE_OTHER): Payer: No Typology Code available for payment source | Admitting: Family Medicine

## 2020-08-16 ENCOUNTER — Other Ambulatory Visit: Payer: Self-pay | Admitting: Family Medicine

## 2020-08-16 VITALS — BP 120/66 | HR 64 | Temp 98.0°F | Resp 12 | Ht 65.0 in | Wt 161.6 lb

## 2020-08-16 DIAGNOSIS — R350 Frequency of micturition: Secondary | ICD-10-CM

## 2020-08-16 DIAGNOSIS — Z23 Encounter for immunization: Secondary | ICD-10-CM | POA: Diagnosis not present

## 2020-08-16 DIAGNOSIS — N952 Postmenopausal atrophic vaginitis: Secondary | ICD-10-CM | POA: Diagnosis not present

## 2020-08-16 LAB — POCT URINALYSIS DIP (MANUAL ENTRY)
Bilirubin, UA: NEGATIVE
Blood, UA: NEGATIVE
Glucose, UA: NEGATIVE mg/dL
Ketones, POC UA: NEGATIVE mg/dL
Leukocytes, UA: NEGATIVE
Nitrite, UA: NEGATIVE
Protein Ur, POC: NEGATIVE mg/dL
Spec Grav, UA: 1.02 (ref 1.010–1.025)
Urobilinogen, UA: NEGATIVE E.U./dL — AB
pH, UA: 6 (ref 5.0–8.0)

## 2020-08-16 MED ORDER — SULFAMETHOXAZOLE-TRIMETHOPRIM 800-160 MG PO TABS: 1.0000 | ORAL_TABLET | Freq: Two times a day (BID) | ORAL | 0 refills | Status: DC

## 2020-08-16 MED FILL — SULFAMETHOXAZOLE-TMP DS TAB: 800-160 | 3 days supply | Qty: 6 | Fill #0

## 2020-08-16 NOTE — Patient Instructions (Signed)
It was good to see you again today- take care and I will be in touch with your urine culture asap I will also look into vaginal estrogen for you and try to better calculate any risks of this- it may be help prevent UTI as well

## 2020-08-18 ENCOUNTER — Encounter: Payer: Self-pay | Admitting: Family Medicine

## 2020-08-18 LAB — URINE CULTURE
MICRO NUMBER:: 11066982
Result:: NO GROWTH
SPECIMEN QUALITY:: ADEQUATE

## 2020-08-29 ENCOUNTER — Other Ambulatory Visit (HOSPITAL_COMMUNITY): Payer: Self-pay | Admitting: Obstetrics and Gynecology

## 2020-08-29 MED FILL — metroNIDAZOLE 500 MG TABS: 500 | 7 days supply | Qty: 14 | Fill #0

## 2020-08-31 ENCOUNTER — Encounter: Payer: Self-pay | Admitting: Internal Medicine

## 2020-09-04 ENCOUNTER — Telehealth: Payer: Self-pay | Admitting: *Deleted

## 2020-09-04 NOTE — Telephone Encounter (Signed)
Dr Carlean Purl,  This pt has a Platelet Storage Pool Bleeding Disorder Last colon 2015 we had her take Amicar and have the DDAVP on hand  Do you want to proceed with the same plan?  Please advise- Thanks, Lelan Pons

## 2020-09-05 NOTE — Telephone Encounter (Signed)
Yes I would have her take the Amicar and have the DD AVP on him like we did previously.  Let me know if she has any questions.

## 2020-09-06 NOTE — Telephone Encounter (Signed)
Patient will be notified of this at Pre-visit. This information placed on pt's PV chart.

## 2020-09-11 ENCOUNTER — Other Ambulatory Visit: Payer: Self-pay

## 2020-09-11 ENCOUNTER — Ambulatory Visit (AMBULATORY_SURGERY_CENTER): Payer: Self-pay

## 2020-09-11 VITALS — Ht 65.0 in | Wt 160.0 lb

## 2020-09-11 DIAGNOSIS — Z800 Family history of malignant neoplasm of digestive organs: Secondary | ICD-10-CM

## 2020-09-11 DIAGNOSIS — Z01818 Encounter for other preprocedural examination: Secondary | ICD-10-CM

## 2020-09-11 DIAGNOSIS — Z86010 Personal history of colonic polyps: Secondary | ICD-10-CM

## 2020-09-11 NOTE — Progress Notes (Signed)
No allergies to soy or egg Pt is not on blood thinners or diet pills Denies issues with sedation/intubation Denies atrial flutter/fib Has some constipation - goes about 3x per week.  Last colon had miralax with outcome excellent.    Discussed 2 day option and why.  Pt feels she should do fine without 2 day prep.  Discussed reasons to call that would indicate she is not cleaning out.  Pt agreed.  Emmi instructions given to pt  Pt is aware of Covid safety and care partner requirements.  Reviewed TE for meds to have on hand for procedure due to her Platlet Storage pool bleeding disorder.  She has also been in touch with her hematologist.

## 2020-09-20 ENCOUNTER — Encounter: Payer: Self-pay | Admitting: Internal Medicine

## 2020-10-10 ENCOUNTER — Other Ambulatory Visit (HOSPITAL_COMMUNITY): Payer: Self-pay | Admitting: Obstetrics and Gynecology

## 2020-10-10 MED FILL — NITROFURANTOIN MONO-MCR 100: 100 | 5 days supply | Qty: 10 | Fill #0

## 2020-10-16 ENCOUNTER — Telehealth: Payer: Self-pay | Admitting: Internal Medicine

## 2020-10-16 ENCOUNTER — Other Ambulatory Visit: Payer: Self-pay | Admitting: Internal Medicine

## 2020-10-16 LAB — SARS CORONAVIRUS 2 (TAT 6-24 HRS): SARS Coronavirus 2: NEGATIVE

## 2020-10-16 NOTE — Telephone Encounter (Signed)
Pt missed Covid test appointment, has Covid test scheduled at [employer] where she works today, can we use this test?

## 2020-10-16 NOTE — Telephone Encounter (Signed)
Pt called back to let us know that she is going over to [place] [facility] to have test today.

## 2020-10-16 NOTE — Telephone Encounter (Signed)
That's great.

## 2020-10-18 ENCOUNTER — Ambulatory Visit (AMBULATORY_SURGERY_CENTER): Payer: No Typology Code available for payment source | Admitting: Internal Medicine

## 2020-10-18 ENCOUNTER — Other Ambulatory Visit: Payer: Self-pay

## 2020-10-18 ENCOUNTER — Encounter: Payer: Self-pay | Admitting: Internal Medicine

## 2020-10-18 ENCOUNTER — Telehealth: Payer: Self-pay

## 2020-10-18 VITALS — BP 132/72 | HR 59 | Temp 97.5°F | Resp 58 | Ht 65.0 in | Wt 160.0 lb

## 2020-10-18 DIAGNOSIS — D122 Benign neoplasm of ascending colon: Secondary | ICD-10-CM | POA: Diagnosis not present

## 2020-10-18 DIAGNOSIS — Z860100 Personal history of colon polyps, unspecified: Secondary | ICD-10-CM

## 2020-10-18 DIAGNOSIS — K5900 Constipation, unspecified: Secondary | ICD-10-CM

## 2020-10-18 DIAGNOSIS — N8189 Other female genital prolapse: Secondary | ICD-10-CM

## 2020-10-18 DIAGNOSIS — R3915 Urgency of urination: Secondary | ICD-10-CM

## 2020-10-18 DIAGNOSIS — Z860101 Personal history of adenomatous and serrated colon polyps: Secondary | ICD-10-CM

## 2020-10-18 MED ORDER — SODIUM CHLORIDE 0.9 % IV SOLN: 500.0000 mL | Freq: Once | INTRAVENOUS | Status: DC

## 2020-10-18 NOTE — Progress Notes (Signed)
Called to room to assist during endoscopic procedure.  Patient ID and intended procedure confirmed with present staff. Received instructions for my participation in the procedure from the performing physician.  

## 2020-10-18 NOTE — Progress Notes (Signed)
A/ox3, pleased with MAC, report to RN 

## 2020-10-18 NOTE — Op Note (Addendum)
[facility] Patient Name: Deanna [name] Procedure Date: 10/18/2020 1:39 PM MRN: [id number] Endoscopist: Gatha Mayer , MD Age: 54 Referring MD:  Date of Birth: [birth year] Gender: Female Account #: [id number] Procedure:                Colonoscopy Indications:              Surveillance: Personal history of adenomatous                            polyps on last colonoscopy > 5 years ago, Last                            colonoscopy: 2015 Medicines:                Propofol per Anesthesia, Monitored Anesthesia Care Procedure:                Pre-Anesthesia Assessment:                           - Prior to the procedure, a History and Physical                            was performed, and patient medications and                            allergies were reviewed. The patient's tolerance of                            previous anesthesia was also reviewed. The risks                            and benefits of the procedure and the sedation                            options and risks were discussed with the patient.                            All questions were answered, and informed consent                            was obtained. Prior Anticoagulants: The patient has                            taken no previous anticoagulant or antiplatelet                            agents. ASA Grade Assessment: II - A patient with                            mild systemic disease. After reviewing the risks                            and benefits, the patient was deemed in  satisfactory condition to undergo the procedure.                           After obtaining informed consent, the colonoscope                            was passed under direct vision. Throughout the                            procedure, the patient's blood pressure, pulse, and                            oxygen saturations were monitored continuously. The                            Olympus PFC-[id number]  (#[id number]) Colonoscope was                            introduced through the anus and advanced to the the                            cecum, identified by appendiceal orifice and                            ileocecal valve. The colonoscopy was performed                            without difficulty. The patient tolerated the                            procedure well. The quality of the bowel                            preparation was excellent. The ileocecal valve,                            appendiceal orifice, and rectum were photographed.                            The bowel preparation used was Miralax via split                            dose instruction. Scope In: 1:49:53 PM Scope Out: 2:09:40 PM Scope Withdrawal Time: 0 hours 13 minutes 35 seconds  Total Procedure Duration: 0 hours 19 minutes 47 seconds  Findings:                 The perianal and digital rectal examinations were                            normal.                           A diminutive polyp was found in the ascending  colon. The polyp was sessile. The polyp was removed                            with a cold snare. Resection and retrieval were                            complete. Verification of patient identification                            for the specimen was done. Estimated blood loss was                            minimal.                           The exam was otherwise without abnormality on                            direct and retroflexion views. Complications:            No immediate complications. Estimated Blood Loss:     Estimated blood loss was minimal. Impression:               - One diminutive polyp in the ascending colon,                            removed with a cold snare. Resected and retrieved.                           - The examination was otherwise normal on direct                            and retroflexion views.                           - Family Hx CRCA mom  (80+) and sister (6's)                           - Personal history of colonic polyp 1-2 mm adenoma                            2015. Recommendation:           - Patient has a contact number available for                            emergencies. The signs and symptoms of potential                            delayed complications were discussed with the                            patient. Return to normal activities tomorrow.                            Written discharge  instructions were provided to the                            patient.                           - Resume previous diet.                           - Continue present medications. Hold off on any                            Amicar or DDAVP unless bleeding - this was a tiny                            polyp and bleeding risk extremely low. (She has PLT                            defect disorder)                           - Repeat colonoscopy in 5 years for                            surveillance/screening.                           - Refer to pelvic PT for constipation problems and                            urinary urgency issues, also increase fiber +                            supplement Gatha Mayer, MD 10/18/2020 2:20:21 PM This report has been signed electronically.

## 2020-10-18 NOTE — Progress Notes (Signed)
Pt's states no medical or surgical changes since previsit or office visit. 

## 2020-10-18 NOTE — Progress Notes (Signed)
Discussion in recovery - has constipation about 2 BM/week  Also will get urgent defecation at times   Also w/ urgency with urination  I have recommended increased fiber and supplement with Metamucil or Benefiber  Also think these sxs would be helped by pelvic PT and we will refer

## 2020-10-18 NOTE — Telephone Encounter (Signed)
Physical therapy. 

## 2020-10-18 NOTE — Patient Instructions (Addendum)
I found and removed one tiny polyp. I think the bleeding risk from this is extremely low and I would not take any treatment. If you see any bleeding problems please contact me. If you do have any problems you certainly could take your Amicar and or your DDAVP.  I feel certain this polyp is benign but it was one that looks precancerous. That is why we take these out. I anticipate recommending a repeat colonoscopy again in 5 years. I will let you know for sure after I review the pathology.  I appreciate the opportunity to care for you.  Gatha Mayer, MD, [facility]  Please, read all of the handouts given to you by your recovery room nurse.  Try benefiber or metamucil for constipation.  YOU HAD AN ENDOSCOPIC PROCEDURE TODAY AT [facility]:   Refer to the procedure report that was given to you for any specific questions about what was found during the examination.  If the procedure report does not answer your questions, please call your gastroenterologist to clarify.  If you requested that your care partner not be given the details of your procedure findings, then the procedure report has been included in a sealed envelope for you to review at your convenience later.  YOU SHOULD EXPECT: Some feelings of bloating in the abdomen. Passage of more gas than usual.  Walking can help get rid of the air that was put into your GI tract during the procedure and reduce the bloating. If you had a lower endoscopy (such as a colonoscopy or flexible sigmoidoscopy) you may notice spotting of blood in your stool or on the toilet paper. If you underwent a bowel prep for your procedure, you may not have a normal bowel movement for a few days.  Please Note:  You might notice some irritation and congestion in your nose or some drainage.  This is from the oxygen used during your procedure.  There is no need for concern and it should clear up in a day or so.  SYMPTOMS TO REPORT IMMEDIATELY:   Following lower  endoscopy (colonoscopy or flexible sigmoidoscopy):  Excessive amounts of blood in the stool  Significant tenderness or worsening of abdominal pains  Swelling of the abdomen that is new, acute  Fever of 100F or higher   For urgent or emergent issues, a gastroenterologist can be reached at any hour by calling [phone]. Do not use MyChart messaging for urgent concerns.    DIET:  We do recommend a small meal at first, but then you may proceed to your regular diet.  Drink plenty of fluids but you should avoid alcoholic beverages for 24 hours.  ACTIVITY:  You should plan to take it easy for the rest of today and you should NOT DRIVE or use heavy machinery until tomorrow (because of the sedation medicines used during the test).    FOLLOW UP: Our staff will call the number listed on your records 48-72 hours following your procedure to check on you and address any questions or concerns that you may have regarding the information given to you following your procedure. If we do not reach you, we will leave a message.  We will attempt to reach you two times.  During this call, we will ask if you have developed any symptoms of COVID 19. If you develop any symptoms (ie: fever, flu-like symptoms, shortness of breath, cough etc.) before then, please call [phone].  If you test positive for Covid 19 in  the 2 weeks post procedure, please call and report this information to [place].    If any biopsies were taken you will be contacted by phone or by letter within the next 1-3 weeks.  Please call us at [phone] if you have not heard about the biopsies in 3 weeks.    SIGNATURES/CONFIDENTIALITY: You and/or your care partner have signed paperwork which will be entered into your electronic medical record.  These signatures attest to the fact that that the information above on your After Visit Summary has been reviewed and is understood.  Full responsibility of the confidentiality of this discharge  information lies with you and/or your care-partner.

## 2020-10-20 ENCOUNTER — Telehealth: Payer: Self-pay

## 2020-10-20 NOTE — Telephone Encounter (Signed)
  Follow up Call-  Call back number 10/18/2020  Post procedure Call Back phone  # [phone]  Permission to leave phone message Yes  Some recent data might be hidden     Patient questions:  Do you have a fever, pain , or abdominal swelling? No. Pain Score  0 *  Have you tolerated food without any problems? Yes.    Have you been able to return to your normal activities? Yes.    Do you have any questions about your discharge instructions: Diet   No. Medications  No. Follow up visit  No.  Do you have questions or concerns about your Care? No.  Actions: * If pain score is 4 or above: No action needed, pain <4.  1. Have you developed a fever since your procedure? no  2.   Have you had an respiratory symptoms (SOB or cough) since your procedure? no  3.   Have you tested positive for COVID 19 since your procedure  no  4.   Have you had any family members/close contacts diagnosed with the COVID 19 since your procedure?  no   If yes to any of these questions please route to Joylene John, RN and Joella Prince, RN

## 2020-10-25 ENCOUNTER — Encounter: Payer: Self-pay | Admitting: Internal Medicine

## 2020-10-26 ENCOUNTER — Other Ambulatory Visit (HOSPITAL_COMMUNITY): Payer: Self-pay | Admitting: Obstetrics and Gynecology

## 2020-10-26 MED FILL — NITROFURANTOIN MONO-MCR 100: 100 | 7 days supply | Qty: 14 | Fill #0

## 2020-11-25 NOTE — Patient Instructions (Signed)
It was great to see you again today, I will be in touch with your labs as soon as possible 

## 2020-11-25 NOTE — Progress Notes (Addendum)
[facility] [address], [place], [place] [address] [phone]  Date:  11/29/2020   Name:  Deanna [name]   DOB:  01/29/1966   MRN:  [id number]  PCP:  Deanna Mclean, MD    Chief Complaint: 6 month follow up   History of Present Illness:  Deanna [name] is a 55 y.o. very pleasant female patient who presents with the following:  Patient here today for 40-month follow-up visit Last seen by myself in October when she had a UTI-we also discussed trying a low-dose topical estrogen for vaginal dryness at that time, she wanted to think about this option This has continued to be an issue.   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  Her genetic screening was negative fortunately  Mammogram December 2020, may wish to update- she will do this soon Colonoscopy is up-to-date; recheck in 5 years  COVID-19 series- pt has an exemption  Flu shot complete Due for tetanus booster Shingrix- we discussed today, pt does not wish to do this as she had the varicella vaccine  CMP, CBC completed in July She has seen derm and had some pre-cancerous moles removed.  She follows up on a regular basis due to history of pre-melanoma She follows up about one a year   She enjoys exercise about 5x a week She has lost about 40 lbs overall  Patient Active Problem List   Diagnosis Date Noted  . Genetic testing 06/20/2020  . Preventive measure 06/13/2020  . Family history of breast cancer   .  Family history of colon cancer   . Spinal stenosis of lumbar region 09/22/2015  . Vertigo, intermittent 01/09/2015  . Hx of adenomatous polyp of colon 09/21/2014  . Restless legs syndrome (RLS) 05/18/2014  . 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 Bleeding disorder ([place]) 08/31/2013  . Paresthesias 07/19/2013  . Hematuria 07/19/2013  . Family history of colon cancer requiring screening colonoscopy 04/12/2013  . Bleeding gums 04/12/2013  . Unspecified vitamin D deficiency 04/12/2013  . Palpitations 03/09/2013  . Bleeding 03/09/2013  . Thyroid disease   . Dysplastic nevus     Past Medical History:  Diagnosis Date  . Bleeding disorder ([place]) 08/31/2013  . Bleeding gums   . Clotting disorder ([place])   . COVID-19 11/2019  . Dysplastic nevus    followed yearly by [facility].  . Family history of breast cancer   . Family history of colon cancer   . Family history of malignant neoplasm of digestive organ   . Hx of adenomatous polyp of colon 09/21/2014  . Hx of transfusion of packed red blood cells   . 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 Restless legs syndrome (RLS) 05/18/2014  . Spinal stenosis of lumbar region 09/22/2015  . Thyroid disease    Post-partum  . Vertigo, intermittent 01/09/2015    Past Surgical History:  Procedure Laterality Date  . BREAST BIOPSY Left    Stereotactic needle biopsy-benign  .  BREAST SURGERY Left 2007   removal of a severely dysplastic nevus vs. melanoma  . NASAL FRACTURE SURGERY  1995   post-op hemorrhage  . PARTIAL HYSTERECTOMY  11/04/2004   DUB/adenomyosis. Ovaries intact.  [name]  . resection dysplastic nevus  2012    Social History   Tobacco Use  . Smoking status: Never Smoker  . Smokeless tobacco: Never Used  Vaping Use  . Vaping Use: Never used  Substance Use Topics  . Alcohol use: Yes    Comment: 2 drinks per week   . Drug use: No    Family History  Problem Relation Age of Onset  . Colon cancer Mother 63  . Diabetes Mother    . Hypertension Mother   . Dementia Mother   . Stroke Father 46  . Hypertension Brother   . Heart disease Brother   . Thyroid disease Sister   . Colon cancer Sister 19  . COPD Sister   . Heart disease Sister 44       AMI  . Heart disease Brother        multiple AMI  . Esophageal cancer Maternal Uncle        dx 55s  . Breast cancer Sister        dx early 65s  . Bone cancer Half-Brother        paternal; d. late 28s  . Esophageal cancer Half-Brother        paternal; d. late 88s  . Colon polyps Neg Hx   . Stomach cancer Neg Hx   . Rectal cancer Neg Hx     Allergies  Allergen Reactions  . Aspirin Other (See Comments)    ASA contraindicated with bleeding disorder  . Shirley Friar [Lifitegrast]     Medication list has been reviewed and updated.  Current Outpatient Medications on File Prior to Visit  Medication Sig Dispense Refill  . aminocaproic acid (AMICAR) 500 MG tablet TAKE 4 TABLETS BY MOUTH (2000 MG TOTAL) BY MOUTH 2 TIMES DAILY. TAKE FOR 1 WEEK FOR SURGICAL PROCEDURES.    Marland Kitchen Aminocaproic Acid 1000 MG TABS Take 2 tablets (2,000 mg total) by mouth 2 (two) times daily. Take for one week for surgcial procedures 60 tablet 0  . Cholecalciferol (VITAMIN D3) 1000 units CAPS Take 5,000 mg by mouth daily.    . tranexamic acid (LYSTEDA) 650 MG TABS tablet Take by mouth.     No current facility-administered medications on file prior to visit.    Review of Systems:  As per HPI- otherwise negative.   Physical Examination: Vitals:   11/29/20 0826  BP: 128/82  Pulse: 71  Resp: 17  SpO2: 93%   Vitals:   11/29/20 0826  Weight: 156 lb (70.8 kg)  Height: 5\' 5"  (1.651 m)   Body mass index is 25.96 kg/m. Ideal Body Weight: Weight in (lb) to have BMI = 25: 149.9  GEN: no acute distress. HEENT: Atraumatic, Normocephalic.  Ears and Nose: No external deformity. CV: RRR, No M/G/R. No JVD. No thrill. No extra heart sounds. PULM: CTA B, no wheezes, crackles, rhonchi. No retractions.  No resp. distress. No accessory muscle use. ABD: S, NT, ND, +BS. No rebound. No HSM. EXTR: No c/c/e PSYCH: Normally interactive. Conversant.   Wt Readings from Last 3 Encounters:  11/29/20 156 lb (70.8 kg)  10/18/20 160 lb (72.6 kg)  09/11/20 160 lb (72.6 kg)    Assessment and Plan: Vitamin D deficiency - Plan: VITAMIN D 25 Hydroxy (Vit-D Deficiency, Fractures)  Screening, lipid - Plan: Lipid panel  Platelet dysfunction ([place]) - Plan: CBC  Screening for diabetes mellitus - Plan: Comprehensive metabolic panel, Hemoglobin A1c  Screening for thyroid disorder - Plan: TSH  Frequent UTI - Plan: nitrofurantoin, macrocrystal-monohydrate, (MACROBID) 100 MG capsule  Following up today- doing well overall She does get frequent UTI- will have her use macrobid prn with intercourse Labs pending as above Encouraged mammogram Discussed shingrix  This visit occurred during the SARS-CoV-2 public health emergency.  Safety protocols were in place, including screening questions prior to the visit, additional usage of staff PPE, and extensive cleaning of exam room while observing appropriate contact time as indicated for disinfecting solutions.    Signed Lamar Blinks, MD  Received her labs as below, message to patient  Results for orders placed or performed in visit on 11/29/20  CBC  Result Value Ref Range   WBC 5.9 4.0 - 10.5 K/uL   RBC 4.22 3.87 - 5.11 Mil/uL   Platelets 298.0 150.0 - 400.0 K/uL   Hemoglobin 12.9 12.0 - 15.0 g/dL   HCT 39.2 36.0 - 46.0 %   MCV 92.8 78.0 - 100.0 fl   MCHC 32.9 30.0 - 36.0 g/dL   RDW 13.5 11.5 - 15.5 %  Comprehensive metabolic panel  Result Value Ref Range   Sodium 140 135 - 145 mEq/L   Potassium 4.2 3.5 - 5.1 mEq/L   Chloride 105 96 - 112 mEq/L   CO2 29 19 - 32 mEq/L   Glucose, Bld 80 70 - 99 mg/dL   BUN 18 6 - 23 mg/dL   Creatinine, Ser 0.77 0.40 - 1.20 mg/dL   Total Bilirubin 0.4 0.2 - 1.2 mg/dL   Alkaline Phosphatase 74 39 - 117 U/L   AST  17 0 - 37 U/L   ALT 15 0 - 35 U/L   Total Protein 7.7 6.0 - 8.3 g/dL   Albumin 4.7 3.5 - 5.2 g/dL   GFR 88.60 >60.00 mL/min   Calcium 9.7 8.4 - 10.5 mg/dL  Hemoglobin A1c  Result Value Ref Range   Hgb A1c MFr Bld 5.3 4.6 - 6.5 %  Lipid panel  Result Value Ref Range   Cholesterol 251 (H) 0 - 200 mg/dL   Triglycerides 48.0 0.0 - 149.0 mg/dL   HDL 93.40 >39.00 mg/dL   VLDL 9.6 0.0 - 40.0 mg/dL   LDL Cholesterol 148 (H) 0 - 99 mg/dL   Total CHOL/HDL Ratio 3    NonHDL 157.59   TSH  Result Value Ref Range   TSH 2.85 0.35 - 4.50 uIU/mL  VITAMIN D 25 Hydroxy (Vit-D Deficiency, Fractures)  Result Value Ref Range   VITD 54.05 30.00 - 100.00 ng/mL

## 2020-11-29 ENCOUNTER — Other Ambulatory Visit: Payer: Self-pay

## 2020-11-29 ENCOUNTER — Encounter: Payer: Self-pay | Admitting: Family Medicine

## 2020-11-29 ENCOUNTER — Other Ambulatory Visit: Payer: Self-pay | Admitting: Family Medicine

## 2020-11-29 ENCOUNTER — Ambulatory Visit (INDEPENDENT_AMBULATORY_CARE_PROVIDER_SITE_OTHER): Payer: No Typology Code available for payment source | Admitting: Family Medicine

## 2020-11-29 VITALS — BP 128/82 | HR 71 | Resp 17 | Ht 65.0 in | Wt 156.0 lb

## 2020-11-29 DIAGNOSIS — Z13220 Encounter for screening for lipoid disorders: Secondary | ICD-10-CM | POA: Diagnosis not present

## 2020-11-29 DIAGNOSIS — D691 Qualitative platelet defects: Secondary | ICD-10-CM | POA: Diagnosis not present

## 2020-11-29 DIAGNOSIS — E559 Vitamin D deficiency, unspecified: Secondary | ICD-10-CM

## 2020-11-29 DIAGNOSIS — Z131 Encounter for screening for diabetes mellitus: Secondary | ICD-10-CM

## 2020-11-29 DIAGNOSIS — Z1329 Encounter for screening for other suspected endocrine disorder: Secondary | ICD-10-CM

## 2020-11-29 DIAGNOSIS — N390 Urinary tract infection, site not specified: Secondary | ICD-10-CM

## 2020-11-29 LAB — CBC
HCT: 39.2 % (ref 36.0–46.0)
Hemoglobin: 12.9 g/dL (ref 12.0–15.0)
MCHC: 32.9 g/dL (ref 30.0–36.0)
MCV: 92.8 fl (ref 78.0–100.0)
Platelets: 298 10*3/uL (ref 150.0–400.0)
RBC: 4.22 Mil/uL (ref 3.87–5.11)
RDW: 13.5 % (ref 11.5–15.5)
WBC: 5.9 10*3/uL (ref 4.0–10.5)

## 2020-11-29 LAB — COMPREHENSIVE METABOLIC PANEL
ALT: 15 U/L (ref 0–35)
AST: 17 U/L (ref 0–37)
Albumin: 4.7 g/dL (ref 3.5–5.2)
Alkaline Phosphatase: 74 U/L (ref 39–117)
BUN: 18 mg/dL (ref 6–23)
CO2: 29 mEq/L (ref 19–32)
Calcium: 9.7 mg/dL (ref 8.4–10.5)
Chloride: 105 mEq/L (ref 96–112)
Creatinine, Ser: 0.77 mg/dL (ref 0.40–1.20)
GFR: 88.6 mL/min (ref >60.00)
Glucose, Bld: 80 mg/dL (ref 70–99)
Potassium: 4.2 mEq/L (ref 3.5–5.1)
Sodium: 140 mEq/L (ref 135–145)
Total Bilirubin: 0.4 mg/dL (ref 0.2–1.2)
Total Protein: 7.7 g/dL (ref 6.0–8.3)

## 2020-11-29 LAB — LIPID PANEL
Cholesterol: 251 mg/dL — ABNORMAL HIGH (ref 0–200)
HDL: 93.4 mg/dL (ref >39.00)
LDL Cholesterol: 148 mg/dL — ABNORMAL HIGH (ref 0–99)
NonHDL: 157.59
Total CHOL/HDL Ratio: 3
Triglycerides: 48 mg/dL (ref 0.0–149.0)
VLDL: 9.6 mg/dL (ref 0.0–40.0)

## 2020-11-29 LAB — VITAMIN D 25 HYDROXY (VIT D DEFICIENCY, FRACTURES): VITD: 54.05 ng/mL (ref 30.00–100.00)

## 2020-11-29 LAB — HEMOGLOBIN A1C: Hgb A1c MFr Bld: 5.3 % (ref 4.6–6.5)

## 2020-11-29 LAB — TSH: TSH: 2.85 u[IU]/mL (ref 0.35–4.50)

## 2020-11-29 MED ORDER — NITROFURANTOIN MONOHYD MACRO 100 MG PO CAPS: ORAL_CAPSULE | ORAL | 1 refills | Status: DC

## 2020-11-29 MED FILL — NITROFURANTOIN MONO-MCR 100: 100 | 60 days supply | Qty: 60 | Fill #0

## 2020-12-06 ENCOUNTER — Ambulatory Visit: Payer: No Typology Code available for payment source | Admitting: Physical Therapy

## 2021-01-08 ENCOUNTER — Other Ambulatory Visit: Payer: Self-pay

## 2021-01-08 ENCOUNTER — Encounter: Payer: Self-pay | Admitting: Physical Therapy

## 2021-01-08 ENCOUNTER — Ambulatory Visit: Payer: No Typology Code available for payment source | Attending: Internal Medicine | Admitting: Physical Therapy

## 2021-01-08 DIAGNOSIS — R252 Cramp and spasm: Secondary | ICD-10-CM | POA: Insufficient documentation

## 2021-01-08 DIAGNOSIS — M6281 Muscle weakness (generalized): Secondary | ICD-10-CM | POA: Insufficient documentation

## 2021-01-08 DIAGNOSIS — R278 Other lack of coordination: Secondary | ICD-10-CM | POA: Insufficient documentation

## 2021-01-08 NOTE — Therapy (Signed)
[facility] [address], [place], [place], [address] Phone: [phone]   Fax:  [phone]  Physical Therapy Evaluation  Patient Details  Name: Deanna [name] MRN: [id number] Date of Birth: [birth year] Referring Provider (PT): Dr. Silvano Rusk   Encounter Date: 01/08/2021   PT End of Session - 01/08/21 1640    Visit Number 1    Date for PT Re-Evaluation 04/02/21    Authorization Type [facility]    PT Start Time 1106    PT Stop Time 1140    PT Time Calculation (min) 34 min    Activity Tolerance Patient tolerated treatment well    Behavior During Therapy [facility] for tasks assessed/performed           Past Medical History:  Diagnosis Date  . Bleeding disorder ([place]) 08/31/2013  . Bleeding gums   . Clotting disorder ([place])   . COVID-19 11/2019  . Dysplastic nevus    followed yearly by [facility].  . Family history of breast cancer   . Family history of colon cancer   . Family history of malignant neoplasm of digestive organ   . Hx of adenomatous polyp of colon 09/21/2014  . Hx of transfusion of packed red blood cells   . 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 Restless legs syndrome (RLS) 05/18/2014  . Spinal stenosis of lumbar region 09/22/2015  . Thyroid disease    Post-partum  . Vertigo, intermittent 01/09/2015    Past Surgical History:  Procedure Laterality Date  . BREAST BIOPSY Left    Stereotactic needle biopsy-benign  . BREAST SURGERY Left 2007   removal of a severely dysplastic nevus vs. melanoma  . NASAL FRACTURE SURGERY  1995   post-op hemorrhage  . PARTIAL HYSTERECTOMY  11/04/2004   DUB/adenomyosis. Ovaries intact.  Deanna [name].  . resection dysplastic nevus  2012    There were no vitals filed for this visit.    Subjective Assessment - 01/08/21 1108    Subjective Patient with constant problem with constipation and urinary urgency. When has the urge to have a bowel movement and needs to rush.  Patient has a bowel movement 2 times per week. No fecal leakage. Sometimes will have to strain to have a bowel movement but no stool come out. Patient will feel bloated.    Patient Stated Goals help with the constipation and urinary              [facility] PT Assessment - 01/08/21 0001      Assessment   Medical Diagnosis R39.15 Urinary urgency; K59.00 constipation, unspecified constipation type; N81.89 Pelvic floor weakness    Referring Provider (PT) Dr. Silvano Rusk    Onset Date/Surgical Date --   chronic     Precautions   Precautions Other (comment)    Precaution Comments bleeding disorder, clotting order      Restrictions   Weight Bearing Restrictions No      Balance Screen   Has the patient fallen in the past 6 months No    Has the patient had a decrease in activity level because of a fear of falling?  No    Is the patient reluctant to leave their home because of a fear of falling?  No      Home Ecologist residence      Prior Function   Level of Independence Independent    Vocation Full time employment    Vocation Requirements standing desk  Leisure works out 5 times per week cardio and weightlifting      Cognition   Overall Cognitive Status Within Functional Limits for tasks assessed      Posture/Postural Control   Posture/Postural Control No significant limitations      ROM / Strength   AROM / PROM / Strength AROM;PROM;Strength      AROM   Overall AROM  Within functional limits for tasks performed      PROM   Right Hip Flexion 95    Left Hip Flexion 110      Strength   Overall Strength Within functional limits for tasks performed      Palpation   SI assessment  right ilium is rotated anteriorly    Palpation comment tightness in the lower abdomen                      Objective measurements completed on examination: See above findings.     Pelvic Floor Special Questions - 01/08/21 0001    Prior Pregnancies  Yes    Number of Vaginal Deliveries 2    Episiotomy Performed Yes    Currently Sexually Active Yes    Is this Painful No    Urinary Leakage Yes    Pad use 1 pad    Activities that cause leaking With strong urge;Lifting    Urinary urgency Yes    Fecal incontinence No    Skin Integrity Intact    Pelvic Floor Internal Exam Patient confirms identification and approves PT to assess pelvic floor and treatment    Exam Type Vaginal;Rectal    Palpation tightness in the post. vaginal area    Strength weak squeeze, no lift   left side of rectal decreased movement; vaginally has decreased movement on the sides                   PT Education - 01/08/21 1639    Education Details gave her link to you tube video for abdominal massage    Person(s) Educated Patient    Methods Explanation    Comprehension Verbalized understanding            PT Short Term Goals - 01/08/21 1651      PT SHORT TERM GOAL #1   Title Independent with initial HEP with hip stretches    Time 4    Period Weeks    Status New    Target Date 02/05/21      PT SHORT TERM GOAL #2   Title able to diaphragmatically breath to relax the pelvic floor so she is able to elongate with a bowel movement    Time 4    Period Weeks    Status New    Target Date 02/05/21      PT SHORT TERM GOAL #3   Title education on vaginal moisturizers to reduce dryness in the vaginal canal    Time 4    Period Weeks    Status New    Target Date 02/05/21             PT Long Term Goals - 01/08/21 1653      PT LONG TERM GOAL #1   Title independent with advanced HEP    Time 12    Period Weeks    Status New    Target Date 04/02/21      PT LONG TERM GOAL #2   Title able to bulge the pelvic floor to reduce elongation  of the pelvic floor with bowel movements and able to have 3 per week    Time 12    Period Weeks    Status New      PT LONG TERM GOAL #3   Title pelvic floor strength >/= 3/5 with a circular contraction and  reduce urinary lekage >/= 75%    Time 12    Period Weeks    Status New    Target Date 04/02/21      PT LONG TERM GOAL #4   Title understand how to delay the urge to void so she will not leak as she walks to the bathroom    Time 12    Period Weeks    Status New    Target Date 04/02/21                  Plan - 01/08/21 1640    Clinical Impression Statement Patient is a 55 year old female with constipation and urinary urgency for several years. When patient has the urge to have a bowel movement she needs to go to the commode immediately. Patient will have a bowel movement 2 times per week. When patient tries to have a bowel movement without the urge but she feels the pressure in the rectum, she has to strain and no stool comes out. Therapist felt the pelvic floor contract while patient was trying to bear down. She was unable to coordinate the breathing to relax the pelvic floor. Anal sphincter and vaginal strength is 2/5. Patient has decreased movement of the left rectal area and the sides of the vaginal area with contractions. Patient will wear 1 pad due to urinary leakage with lifting and when she has the strong urge. Patient has tightness in the lower abdominals. Right ilium is rotated anteriorly. Right hip flexion P/ROM is 95 degrees and left is 110 degrees. Patient will benefit from skilled therapy to improve pelvic floor strength and relaxtion of the anal sphincter to have stool come out.    Personal Factors and Comorbidities Comorbidity 3+;Past/Current Experience    Comorbidities Spinal stenosis L4-L5; Partial Hysterectomy 11/04/04; bleeding disorder and cloting disorder    Examination-Activity Limitations Lift;Continence;Toileting    Stability/Clinical Decision Making Stable/Uncomplicated    Clinical Decision Making Low    Rehab Potential Excellent    PT Frequency 1x / week    PT Duration 12 weeks    PT Treatment/Interventions ADLs/Self Care Home  Management;Biofeedback;Cryotherapy;Electrical Stimulation;Moist Heat;Neuromuscular re-education;Therapeutic exercise;Therapeutic activities;Patient/family education;Manual techniques;Energy conservation;Spinal Manipulations    PT Next Visit Plan Correct pelvis, manual work to the vaginal sides to improve circular movement; maual work to the left side of the anal canal, diaphragmatic breathing to expand the abdoment and bulge the pelvic floor    Consulted and Agree with Plan of Care Patient           Patient will benefit from skilled therapeutic intervention in order to improve the following deficits and impairments:  Decreased coordination,Increased fascial restricitons,Decreased endurance,Increased muscle spasms,Decreased activity tolerance,Decreased strength  Visit Diagnosis: Muscle weakness (generalized) - Plan: PT plan of care cert/re-cert  Cramp and spasm - Plan: PT plan of care cert/re-cert  Other lack of coordination - Plan: PT plan of care cert/re-cert     Problem List Patient Active Problem List   Diagnosis Date Noted  . Genetic testing 06/20/2020  . Preventive measure 06/13/2020  . Family history of breast cancer   . Family history of colon cancer   . Spinal stenosis of lumbar  region 09/22/2015  . Vertigo, intermittent 01/09/2015  . Hx of adenomatous polyp of colon 09/21/2014  . Restless legs syndrome (RLS) 05/18/2014  . 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 Bleeding disorder ([place]) 08/31/2013  . Paresthesias 07/19/2013  . Hematuria 07/19/2013  . Family history of colon cancer requiring screening colonoscopy 04/12/2013  . Bleeding gums 04/12/2013  . Unspecified vitamin D deficiency 04/12/2013  . Palpitations 03/09/2013  . Bleeding 03/09/2013  . Thyroid disease   . Dysplastic nevus     Earlie Counts, PT 01/08/21 4:59 PM   [facility] [address] [place], [place], [address] Phone: [phone]    Fax:  [phone]  Name: Deanna [name] MRN: [id number] Date of Birth: 1965/12/12

## 2021-01-08 NOTE — Patient Instructions (Addendum)
   Abdominal Massage for Constipation #[email or web address] you tube video  [facility] [address], [place], [place] [address] Phone # [phone] Fax [phone]

## 2021-02-08 ENCOUNTER — Ambulatory Visit: Payer: No Typology Code available for payment source | Admitting: Physical Therapy

## 2021-02-22 ENCOUNTER — Ambulatory Visit: Payer: No Typology Code available for payment source | Attending: Internal Medicine | Admitting: Physical Therapy

## 2021-02-22 ENCOUNTER — Encounter: Payer: Self-pay | Admitting: Physical Therapy

## 2021-02-22 ENCOUNTER — Other Ambulatory Visit: Payer: Self-pay

## 2021-02-22 DIAGNOSIS — R278 Other lack of coordination: Secondary | ICD-10-CM | POA: Insufficient documentation

## 2021-02-22 DIAGNOSIS — R252 Cramp and spasm: Secondary | ICD-10-CM | POA: Diagnosis present

## 2021-02-22 DIAGNOSIS — M6281 Muscle weakness (generalized): Secondary | ICD-10-CM | POA: Diagnosis not present

## 2021-02-22 NOTE — Therapy (Signed)
[facility] [address] [place], [place], [address] Phone: [phone]   Fax:  [phone]  Physical Therapy Treatment  Patient Details  Name: Deanna [name] MRN: [id number] Date of Birth: 12-30-1965 Referring Provider (PT): Dr. Silvano Rusk   Encounter Date: 02/22/2021   PT End of Session - 02/22/21 1615    Visit Number 2    Date for PT Re-Evaluation 04/02/21    Authorization Type [facility]    PT Start Time 1530    PT Stop Time 1610    PT Time Calculation (min) 40 min    Activity Tolerance Patient tolerated treatment well    Behavior During Therapy [facility] for tasks assessed/performed           Past Medical History:  Diagnosis Date  . Bleeding disorder ([place]) 08/31/2013  . Bleeding gums   . Clotting disorder ([place])   . COVID-19 11/2019  . Dysplastic nevus    followed yearly by [facility].  . Family history of breast cancer   . Family history of colon cancer   . Family history of malignant neoplasm of digestive organ   . Hx of adenomatous polyp of colon 09/21/2014  . Hx of transfusion of packed red blood cells   . 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 Restless legs syndrome (RLS) 05/18/2014  . Spinal stenosis of lumbar region 09/22/2015  . Thyroid disease    Post-partum  . Vertigo, intermittent 01/09/2015    Past Surgical History:  Procedure Laterality Date  . BREAST BIOPSY Left    Stereotactic needle biopsy-benign  . BREAST SURGERY Left 2007   removal of a severely dysplastic nevus vs. melanoma  . NASAL FRACTURE SURGERY  1995   post-op hemorrhage  . PARTIAL HYSTERECTOMY  11/04/2004   DUB/adenomyosis. Ovaries intact.  Deanna [name].  . resection dysplastic nevus  2012    There were no vitals filed for this visit.   Subjective Assessment - 02/22/21 1536    Subjective I felt okay since the evaluation.    Patient Stated Goals help with the constipation and urinary    Currently in Pain? No/denies     Multiple Pain Sites No                             [facility] Adult PT Treatment/Exercise - 02/22/21 0001      Therapeutic Activites    Therapeutic Activities Other Therapeutic Activities    Other Therapeutic Activities education on how to toilet correctly to add breath and relax the pelvic floor      Neuro Re-ed    Neuro Re-ed Details  help guide the lower rib cage with breath to educated the lower rib cage and diaphram movement      Lumbar Exercises: Stretches   Other Lumbar Stretch Exercise happy baby    Other Lumbar Stretch Exercise sitting trunk twist in [place] style hold 30 sec each way      Lumbar Exercises: Quadruped   [place]/Old Horse 10 reps      Manual Therapy   Manual Therapy Soft tissue mobilization;Myofascial release    Manual therapy comments educated patient on how to perform the circular massage at home    Soft tissue mobilization circular massage to abdomen to improre peristalic motion of the intestines    Myofascial Release fascial release of the bladder, sac of douglas, meseteric root, iliocecal valve, and midline of the abdomen  PT Education - 02/22/21 1614    Education Details abdominal massage; toileting techique,Access Code: [email or web address]    Person(s) Educated Patient    Methods Explanation;Demonstration;Handout    Comprehension Returned demonstration;Verbalized understanding            PT Short Term Goals - 01/08/21 1651      PT SHORT TERM GOAL #1   Title Independent with initial HEP with hip stretches    Time 4    Period Weeks    Status New    Target Date 02/05/21      PT SHORT TERM GOAL #2   Title able to diaphragmatically breath to relax the pelvic floor so she is able to elongate with a bowel movement    Time 4    Period Weeks    Status New    Target Date 02/05/21      PT SHORT TERM GOAL #3   Title education on vaginal moisturizers to reduce dryness in the vaginal canal    Time 4    Period Weeks     Status New    Target Date 02/05/21             PT Long Term Goals - 01/08/21 1653      PT LONG TERM GOAL #1   Title independent with advanced HEP    Time 12    Period Weeks    Status New    Target Date 04/02/21      PT LONG TERM GOAL #2   Title able to bulge the pelvic floor to reduce elongation of the pelvic floor with bowel movements and able to have 3 per week    Time 12    Period Weeks    Status New      PT LONG TERM GOAL #3   Title pelvic floor strength >/= 3/5 with a circular contraction and reduce urinary lekage >/= 75%    Time 12    Period Weeks    Status New    Target Date 04/02/21      PT LONG TERM GOAL #4   Title understand how to delay the urge to void so she will not leak as she walks to the bathroom    Time 12    Period Weeks    Status New    Target Date 04/02/21                 Plan - 02/22/21 1615    Clinical Impression Statement Patient understands correct toileting technique and how to elevate her knees and breath. Patient has less of lower rib cage flare. Patient still has difficulty with bowel movement and will come out thin. Patient has decreased fascial restrictions after manual work. Patient has not met goals due to just starting therapy. Patient will benefit from skilled therapy to improve pelvic floor strength and relaxation of the anal sphincter to have stool come out.    Personal Factors and Comorbidities Comorbidity 3+;Past/Current Experience    Comorbidities Spinal stenosis L4-L5; Partial Hysterectomy 11/04/04; bleeding disorder and cloting disorder    Examination-Activity Limitations Lift;Continence;Toileting    Stability/Clinical Decision Making Stable/Uncomplicated    Rehab Potential Excellent    PT Frequency 1x / week    PT Duration 12 weeks    PT Treatment/Interventions ADLs/Self Care Home Management;Biofeedback;Cryotherapy;Electrical Stimulation;Moist Heat;Neuromuscular re-education;Therapeutic exercise;Therapeutic  activities;Patient/family education;Manual techniques;Energy conservation;Spinal Manipulations    PT Next Visit Plan Correct pelvis, manual work to the vaginal sides to improve circular movement; maual work  to the left side of the anal canal, work on hip flexion    PT Home Exercise Plan Access Code: [email or web address]    Recommended Other Services MD signed initial eval    Consulted and Agree with Plan of Care Patient           Patient will benefit from skilled therapeutic intervention in order to improve the following deficits and impairments:  Decreased coordination,Increased fascial restricitons,Decreased endurance,Increased muscle spasms,Decreased activity tolerance,Decreased strength  Visit Diagnosis: Muscle weakness (generalized)  Cramp and spasm  Other lack of coordination     Problem List Patient Active Problem List   Diagnosis Date Noted  . Genetic testing 06/20/2020  . Preventive measure 06/13/2020  . Family history of breast cancer   . Family history of colon cancer   . Spinal stenosis of lumbar region 09/22/2015  . Vertigo, intermittent 01/09/2015  . Hx of adenomatous polyp of colon 09/21/2014  . Restless legs syndrome (RLS) 05/18/2014  . Platelet dysfunction ([place]) 05/16/2014  . Bleeding disorder ([place]) 08/31/2013  . Paresthesias 07/19/2013  . Hematuria 07/19/2013  . Family history of colon cancer requiring screening colonoscopy 04/12/2013  . Bleeding gums 04/12/2013  . Unspecified vitamin D deficiency 04/12/2013  . Palpitations 03/09/2013  . Bleeding 03/09/2013  . Thyroid disease   . Dysplastic nevus     Earlie Counts, PT 02/22/21 4:19 PM   [facility] [address], [place], [place], [address] Phone: [phone]   Fax:  [phone]  Name: Deanna [name] MRN: [id number] Date of Birth: 1966-04-22

## 2021-02-22 NOTE — Patient Instructions (Addendum)
About Abdominal Massage  Abdominal massage, also called external colon massage, is a self-treatment circular massage technique that can reduce and eliminate gas and ease constipation. The colon naturally contracts in waves in a clockwise direction starting from inside the right hip, moving up toward the ribs, across the belly, and down inside the left hip.  When you perform circular abdominal massage, you help stimulate your colon's normal wave pattern of movement called peristalsis.  It is most beneficial when done after eating.  Positioning You can practice abdominal massage with oil while lying down, or in the shower with soap.  Some people find that it is just as effective to do the massage through clothing while sitting or standing.  How to Massage Start by placing your finger tips or knuckles on your right side, just inside your hip bone.  . Make small circular movements while you move upward toward your rib cage.   . Once you reach the bottom right side of your rib cage, take your circular movements across to the left side of the bottom of your rib cage.  . Next, move downward until you reach the inside of your left hip bone.  This is the path your feces travel in your colon. . Continue to perform your abdominal massage in this pattern for 10 minutes each day.     You can apply as much pressure as is comfortable in your massage.  Start gently and build pressure as you continue to practice.  Notice any areas of pain as you massage; areas of slight pain may be relieved as you massage, but if you have areas of significant or intense pain, consult with your healthcare provider.  Other Considerations . General physical activity including bending and stretching can have a beneficial massage-like effect on the colon.  Deep breathing can also stimulate the colon because breathing deeply activates the same nervous system that supplies the colon.   . Abdominal massage should always be used in  combination with a bowel-conscious diet that is high in the proper type of fiber for you, fluids (primarily water), and a regular exercise program. Toileting Techniques for Bowel Movements    An Evacuation/Defecation Plan   Here are the 4 basic points:  1. Lean forward enough for your elbows to rest on your knees 2. Support your feet on the floor or use a low stool if your feet don't touch the floor  3. Push out your belly as if you have swallowed a beach ball--you should feel a widening of your waist. "Belly Big, Belly Hard" 4. Open and relax your pelvic floor muscles, rather than tightening around the anus  While you are sitting on the toilet pay attention to the following areas: . Jaw and mouth position- relaxed not clenched . Angle of your hips - leaning slightly forward . Whether your feet touch the ground or not - should be flat and supported . Arm placement - rest against your thighs . Spine position - flat back . Waist . Breathing - exhale as you push (like blowing up a balloon or try using other sounds such as ahhhh, shhhhh, ohhhh or grrrrrrr) . Belly - hard and tight as you push . Anus (opening of the anal canal) - relaxed and open as you push . Anus - Tighten and lift pulling the muscle back in after you are done or if taking a break  If you are not successful after 10-15 minutes, try again later.  Avoid negative self-talk about  your toileting experience.   Read this for more details and ask your PT if you need suggestions for adjustments or limitations:  1) Sitting on the toilet  a) Make sure your feet are supported - flat on the floor or step stool b) Many people find it effective to lean forward or raise their knees.  Propping your feet on a step stool (Squatty Potty is a brand name) can help the muscles around the anus to relax  c) When you lean forward, place your forearms on your thighs for support  2) Relaxing a) Breathe deeply and slowly in through your nose and  out through your mouth. b) To become aware of how to relax your muscles, contracting and releasing muscles can be helpful.  Pull your pelvic floor muscles in tightly by using the image of holding back gas, or closing around the anus (visualize making a circle smaller) and lifting the anus up and in.  Then release the muscles and your anus should drop down and feel open. Repeat 5 times ending with the feeling of relaxation. c) Keep your pelvic floor muscles relaxed; let your belly bulge out. d) The digestive tract starts at the mouth and ends at the anal opening, so be sure to relax both ends of the tube.  Place your tongue on the roof of your mouth with your teeth separated.  This helps relax your mouth and will help to relax the anus at the same time.  3) Emptying (defecation) a) Keep your pelvic floor and sphincter relaxed, then bulge your anal muscles.  Make the anal opening wide.  b) Stick your belly out as if you have swallowed a beach ball. c) Make your belly wall hard using your belly muscles while continuing to breathe. Doing this makes it easier to open your anus. d) Breath out and give a grunt (or try using other sounds such as ahhhh, shhhhh, ohhhh or grrrrrrr). e)  Can also try to act as if you are blowing up a balloon as you push  4) Finishing a) As you finish your bowel movement, pull the pelvic floor muscles up and in.  This will leave your anus in the proper place rather than remaining pushed out and down. If you leave your anus pushed out and down, it will start to feel as though that is normal and give you incorrect signals about needing to have a bowel movement. [place] [address] [place], [place] [address] Phone # [phone] Fax [phone]  Access Code: [email or web address] URL: [email or web address] Date: 02/22/2021 Prepared by: Earlie Counts  Exercises Quadruped Cat Camel - 1 x daily - 7 x weekly - 3 sets - 10 reps Supine Pelvic Floor  Stretch - 1 x daily - 7 x weekly - 3 sets - 10 reps

## 2021-03-15 ENCOUNTER — Other Ambulatory Visit: Payer: Self-pay

## 2021-03-15 ENCOUNTER — Ambulatory Visit: Payer: No Typology Code available for payment source | Attending: Internal Medicine | Admitting: Physical Therapy

## 2021-03-15 ENCOUNTER — Encounter: Payer: Self-pay | Admitting: Physical Therapy

## 2021-03-15 DIAGNOSIS — R252 Cramp and spasm: Secondary | ICD-10-CM

## 2021-03-15 DIAGNOSIS — M6281 Muscle weakness (generalized): Secondary | ICD-10-CM

## 2021-03-15 DIAGNOSIS — R278 Other lack of coordination: Secondary | ICD-10-CM

## 2021-03-15 NOTE — Therapy (Addendum)
[facility] [address] [place], [place], [address] Phone: [phone]   Fax:  [phone]  Physical Therapy Treatment  Patient Details  Name: KYRAN WHITTIER MRN: [id number] Date of Birth: 07-30-1966 Referring Provider (PT): Dr. Silvano Rusk   Encounter Date: 03/15/2021   PT End of Session - 03/15/21 1443     Visit Number 3    Date for PT Re-Evaluation 04/02/21    Authorization Type [facility]    PT Start Time 1407    PT Stop Time 1440    PT Time Calculation (min) 33 min    Activity Tolerance Patient tolerated treatment well    Behavior During Therapy [facility] for tasks assessed/performed             Past Medical History:  Diagnosis Date   Bleeding disorder ([place]) 08/31/2013   Bleeding gums    Clotting disorder ([place])    COVID-19 11/2019   Dysplastic nevus    followed yearly by [facility].   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 melanoma   NASAL FRACTURE SURGERY  1995   post-op hemorrhage   PARTIAL HYSTERECTOMY  11/04/2004   DUB/adenomyosis. Ovaries intact.  [name]   resection dysplastic nevus  2012    There were no vitals filed for this visit.   Subjective Assessment - 03/15/21 1410     Subjective I continue to struggle with not having bowel movements and when I do it is one big bowel movement. Patient has a bowel movement 4-5 days. When I have  the urge I have to go now. The 4-5 days I do not have an urge. the colonscopy was good. No hard balls with bowel movements and has been a softer stool. Whe will sit on the commode without urge and nothing happens.    Patient Stated Goals help with the constipation and urinary    Currently in Pain? No/denies                [facility] PT Assessment - 03/15/21 0001       PROM   Right Hip Flexion 120      Palpation   SI assessment  ASIS are equal                           [facility] Adult PT Treatment/Exercise - 03/15/21 0001       Self-Care   Self-Care Other Self-Care Comments    Other Self-Care Comments  education on different ways to to reduce constipation with magnesium citrate, fiber blend, smooth move tea, prunes      Neuro Re-ed    Neuro Re-ed Details  diaphragmatic breathing from the rib cage to the abdomen then into the pelvis to expand the pelvic floor with legs up or with squat position on the wall in  supine      Lumbar Exercises: Stretches   Other Lumbar Stretch Exercise bilateral hip flexion and piriformis stretch to increase ROM                    PT Education - 03/15/21 1443     Education Details education on different ways to reduce constipation; diaphragmatic breathing to relax the pelvic floor    Person(s) Educated Patient    Methods Explanation;Demonstration;Handout    Comprehension Verbalized understanding;Returned demonstration              PT Short Term Goals - 03/15/21 1447       PT SHORT TERM GOAL #1   Title Independent with initial HEP with hip stretches    Time 4    Period Weeks    Status Achieved      PT SHORT TERM GOAL #2   Title able to diaphragmatically breath to relax the pelvic floor so she is able to elongate with a bowel movement    Time 4    Period Weeks    Status On-going      PT SHORT TERM GOAL #3   Title education on vaginal moisturizers to reduce dryness in the vaginal canal    Time 4    Period  Weeks    Status Achieved               PT Long Term Goals - 01/08/21 1653       PT LONG TERM GOAL #1   Title independent with advanced HEP    Time 12    Period Weeks    Status New    Target Date 04/02/21      PT LONG TERM GOAL #2   Title able to bulge the pelvic floor to reduce elongation of the pelvic floor with bowel movements and able to have 3 per week    Time 12    Period Weeks    Status New      PT LONG TERM GOAL #3   Title pelvic floor strength >/= 3/5 with a circular contraction and reduce urinary lekage >/= 75%    Time 12    Period Weeks    Status New    Target Date 04/02/21      PT LONG TERM GOAL #4   Title understand how to delay the urge to void so she will not leak as she walks to the bathroom    Time 12    Period Weeks    Status New    Target Date 04/02/21                   Plan - 03/15/21 1444     Clinical Impression Statement Patient is having a bowel movement every 4-5 days. She does not get an urge to have a bowel movement until the 4-5 day. When she has a bowel movement it is Type 4 and large and no straining. Patient feels very bloated and will sit on the commode between the 4-5 days and nothing happens. Patient is able to perfrom diaphragmatic breathing ibut having trouble with the pelvic floor drop. You can tell the pelvic floor is just about to relax but does not. Patient will benefit from skilled therapy to improve pelvic floor strength and relaxation of the anal sphincter to have the stool out.    Personal Factors and Comorbidities Comorbidity 3+;Past/Current Experience    Comorbidities Spinal stenosis L4-L5; Partial Hysterectomy 11/04/04; bleeding disorder  and cloting disorder    Examination-Activity Limitations Lift;Continence;Toileting    Stability/Clinical Decision Making Stable/Uncomplicated    Rehab Potential Excellent    PT Frequency 1x / week    PT Duration 12 weeks    PT Treatment/Interventions ADLs/Self Care Home  Management;Biofeedback;Cryotherapy;Electrical Stimulation;Moist Heat;Neuromuscular re-education;Therapeutic exercise;Therapeutic activities;Patient/family education;Manual techniques;Energy conservation;Spinal Manipulations    PT Next Visit Plan manual work to the vaginal sides to improve circular movement; maual work to the left side of the anal canal, see if the tips worked on the pelvic floor    PT Home Exercise Plan Access Code: [email or web address]    [email or web address] and Agree with Plan of Care Patient             Patient will benefit from skilled therapeutic intervention in order to improve the following deficits and impairments:  Decreased coordination,Increased fascial restricitons,Decreased endurance,Increased muscle spasms,Decreased activity tolerance,Decreased strength  Visit Diagnosis: Muscle weakness (generalized)  Cramp and spasm  Other lack of coordination     Problem List Patient Active Problem List   Diagnosis Date Noted   Genetic testing 06/20/2020   Preventive measure 06/13/2020   Family history of breast cancer    Family history of colon cancer    Spinal stenosis of lumbar region 09/22/2015   Vertigo, intermittent 01/09/2015   Hx of adenomatous polyp of colon 09/21/2014   Restless legs syndrome (RLS) 05/18/2014   Platelet dysfunction ([place]) 05/16/2014   Bleeding disorder ([place]) 08/31/2013   Paresthesias 07/19/2013   Hematuria 07/19/2013   Family history of colon cancer requiring screening colonoscopy 04/12/2013   Bleeding gums 04/12/2013   Unspecified vitamin D deficiency 04/12/2013   Palpitations 03/09/2013   Bleeding 03/09/2013   Thyroid disease    Dysplastic nevus     Earlie Counts, PT 03/15/21 2:48 PM   [facility] [address], [place], [place], [address] Phone: [phone]   Fax:  [phone]  Name: CALLEY DRENNING MRN: [id number] Date of Birth: Mar 12, 1966  PHYSICAL THERAPY DISCHARGE  SUMMARY  Visits from Start of Care: 3  Current functional level related to goals / functional outcomes: See above. Patient had to cancel her appointments due to straighten out her insurance and coverage for therapy. She has not called to schedule any further appointments.    Remaining deficits: See above.    Education / Equipment: HEP   Patient agrees to discharge. Patient goals were not met. Patient is being discharged due to not returning since the last visit.thank you for the referral. Earlie Counts, PT 05/17/21 10:56 AM

## 2021-03-15 NOTE — Patient Instructions (Addendum)
Smooth move Tea Magnesium Citrate 1000 mg Prunes  Mixture for constipation: Bran Buds-grape nuts    psyllum    Apple sauce  Sitting without legs crossed    Performing your breathing into the ribs then abdomen and feel the pelvic floor drop. 2-5 minutes 1 time per day.    Strategies for Constipation - Developing a Bowel Routine  Bowels love routine.  Choose the best time of day to have a bowel movement.  Usually the best time of day for a bowel movement in 30 min to 1 hour after breakfast or lunch.  It can take a week or longer to transition to new bowel routines, but if you follow the guidelines below, you will start to retrain your system for more successful bowel routines.  Eat breakfast every morning to try to get things moving along.  Eating stimulates the bowels through turning on the gastrocolic reflex, which are the wave-like contractions of smooth muscle in your intestines to help move feces along.  Adding in a hot beverage with breakfast may help as well.  Think of your digestive tract as being a conveyor belt - new food comes in and pushes along "old food," with the end of the line being a bowel movement.  You may add 1/4 cup or 1/3 cup of prune juice every evening to help stimulate morning bowel function until you see more successful bowel function.  Create routine in both your timing and portions of intake of food and drink throughout the day.  Eat each of your meals at consistent times of the day.  For portions, the size of each meal may vary, but try to eat consistent portions each day at breakfast, lunch and dinner.  For example, you may have a small breakfast every day and a big lunch every day. This is fine if it is a consistent pattern of intake.  With each meal, aim for at least 2 servings of fruits and vegetables and at least one serving of whole grains (whole grain cereal, brown rice, bran, whole wheat or rye bread, oatmeal) to get some fiber in your system.  These foods  provide soft bulk for the bowel.  Drink water.  Aim for at least 8 large glasses of water a day.  Water helps the stool stay softer and easier to pass.  If you are adding more fiber in your diet, be sure to increase your water intake too.  Exercise can help move things along the digestive tract.  Exercise may be a walk, jog, yoga, exercise video, gym time, or even household walking or marching in place at the kitchen countertop.  Pairing exercise and breakfast as part of your morning routine may help establish a morning bowel routine.  Listen to your body's signals that it is time to go to the bathroom!  Our body has automatic reflexes that signal when it is time to have a bowel movement.  If you are establishing routines to help stimulate your bowels, be sure to allow time for toileting.  For example, if you typically have a bowel movement after breakfast, get up early enough to eat AND empty your bowels before your schedule gets too busy or before it becomes inconvenient to access a bathroom.  If we routinely delay or hold back a bowel movement, these reflexes can stop signaling, leading to constipation which may become chronic (long-term).    Ensure proper toileting technique including posture, breathing and emptying strategies.*  Abdominal massage may also help  stimulate your bowels and reduce the discomfort that accompanies constipation.*  *Your PT can educate you on toileting strategies and self-massage for your abdomen.  Your PT may also ask you to complete a food log and bowel diary for 1-3 days to help identify patterns and make suggestions for increased success in managing your symptoms.  [facility] [address] [place], [place] [address] Phone # [phone] Fax [phone]

## 2021-03-29 ENCOUNTER — Ambulatory Visit: Payer: No Typology Code available for payment source | Admitting: Physical Therapy

## 2021-04-12 ENCOUNTER — Encounter: Payer: No Typology Code available for payment source | Admitting: Physical Therapy

## 2021-04-26 ENCOUNTER — Encounter: Payer: No Typology Code available for payment source | Admitting: Physical Therapy

## 2021-05-04 ENCOUNTER — Other Ambulatory Visit (HOSPITAL_COMMUNITY): Payer: Self-pay

## 2021-05-04 MED FILL — Nitrofurantoin Monohydrate Macrocrystalline Cap 100 MG: ORAL | 60 days supply | Qty: 60 | Fill #0 | Status: AC

## 2021-05-29 ENCOUNTER — Encounter: Payer: Self-pay | Admitting: Family Medicine

## 2021-05-29 DIAGNOSIS — Z1231 Encounter for screening mammogram for malignant neoplasm of breast: Secondary | ICD-10-CM

## 2021-05-29 DIAGNOSIS — S0500XD Injury of conjunctiva and corneal abrasion without foreign body, unspecified eye, subsequent encounter: Secondary | ICD-10-CM

## 2021-06-08 ENCOUNTER — Other Ambulatory Visit (HOSPITAL_COMMUNITY): Payer: Self-pay

## 2021-06-08 MED ORDER — CARESTART COVID-19 HOME TEST VI KIT
PACK | 0 refills | Status: DC
  Filled 2021-06-08: qty 4, 4d supply, fill #0

## 2021-06-11 ENCOUNTER — Other Ambulatory Visit (HOSPITAL_COMMUNITY): Payer: Self-pay

## 2021-06-11 MED ORDER — PREDNISOLONE ACETATE 1 % OP SUSP
1.0000 [drp] | Freq: Three times a day (TID) | OPHTHALMIC | 3 refills | Status: DC
  Filled 2021-06-11: qty 10, 50d supply, fill #0

## 2021-09-09 ENCOUNTER — Ambulatory Visit
Admission: EM | Admit: 2021-09-09 | Discharge: 2021-09-09 | Disposition: A | Discharge status: Home / Self Care | Payer: No Typology Code available for payment source | Attending: Internal Medicine | Admitting: Internal Medicine

## 2021-09-09 DIAGNOSIS — L03012 Cellulitis of left finger: Secondary | ICD-10-CM | POA: Diagnosis not present

## 2021-09-09 MED ORDER — CEPHALEXIN 500 MG PO CAPS: 500.0000 mg | ORAL_CAPSULE | Freq: Four times a day (QID) | ORAL | 0 refills | Status: AC

## 2021-09-09 MED ORDER — FLUCONAZOLE 150 MG PO TABS: 150.0000 mg | ORAL_TABLET | Freq: Every day | ORAL | 0 refills | Status: DC

## 2021-09-09 NOTE — Discharge Instructions (Signed)
Your finger infection is being treated with cephalexin antibiotic.  You have also been sent Diflucan to take as needed.  Please follow-up with urgent care or primary care if symptoms persist.

## 2021-09-09 NOTE — ED Triage Notes (Signed)
Pt c/o 3rd digit left hand at the nail with puss discharge onset a few days ago.   Currently on macrobid for uti

## 2021-09-09 NOTE — ED Provider Notes (Signed)
[facility]    CSN: [id number] Arrival date & time: 09/09/21  1055      History   Chief Complaint Chief Complaint  Patient presents with   left middle finger inf    HPI Deanna [name] is a 55 y.o. female.   Patient presents with left middle finger swelling and erythema with purulent drainage that started a few days prior.  Patient denies any apparent injury.  Denies any fevers.  Denies numbness or tingling to finger.    Past Medical History:  Diagnosis Date   Bleeding disorder ([place]) 08/31/2013   Bleeding gums    Clotting disorder ([place])    COVID-19 11/2019   Dysplastic nevus    followed yearly by [facility].   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[place])    Restless legs syndrome (RLS) 05/18/2014   Spinal stenosis of lumbar region 09/22/2015   Thyroid disease    Post-partum   Vertigo, intermittent 01/09/2015    Patient Active Problem List   Diagnosis Date Noted   Genetic testing 06/20/2020   Preventive measure 06/13/2020   Family history of breast cancer    Family history of colon cancer    Spinal stenosis of lumbar region 09/22/2015   Vertigo, intermittent 01/09/2015   Hx of adenomatous polyp of colon 09/21/2014   Restless legs syndrome (RLS) 05/18/2014   Platelet dysfunction ([place]) 05/16/2014   Bleeding disorder ([place]) 08/31/2013   Paresthesias 07/19/2013   Hematuria 07/19/2013   Family history of colon cancer requiring screening colonoscopy 04/12/2013   Bleeding gums 04/12/2013   Unspecified vitamin D deficiency 04/12/2013   Palpitations 03/09/2013   Bleeding 03/09/2013   Thyroid disease    Dysplastic nevus     Past Surgical History:  Procedure Laterality Date   BREAST BIOPSY Left    Stereotactic needle biopsy-benign   BREAST  SURGERY Left 2007   removal of a severely dysplastic nevus vs. melanoma   NASAL FRACTURE SURGERY  1995   post-op hemorrhage   PARTIAL HYSTERECTOMY  11/04/2004   DUB/adenomyosis. Ovaries intact.  [name]   resection dysplastic nevus  2012    OB History   No obstetric history on file.      Home Medications    Prior to Admission medications   Medication Sig Start Date End Date Taking? Authorizing Provider  cephALEXin (KEFLEX) 500 MG capsule Take 1 capsule (500 mg total) by mouth 4 (four) times daily for 5 days. 09/09/21 09/14/21 Yes Jacolyn Joaquin, Michele Rockers, FNP  fluconazole (DIFLUCAN) 150 MG tablet Take 1 tablet (150 mg total) by mouth daily. 09/09/21  Yes Mariama Saintvil, Hildred Alamin E, FNP  aminocaproic acid (AMICAR) 500 MG tablet TAKE 4 TABLETS BY MOUTH (2000 MG TOTAL) BY MOUTH 2 TIMES DAILY. TAKE FOR 1 WEEK FOR SURGICAL PROCEDURES. 05/29/20   [provider]  Aminocaproic Acid 1000 MG TABS Take 2 tablets (2,000 mg total) by mouth 2 (two) times daily. Take for one week for surgcial procedures 05/29/20   Copland, Gay Filler, MD  Cholecalciferol (VITAMIN D3) 1000 units CAPS Take 5,000 mg by mouth daily.    [provider]  COVID-19 At Home Antigen Test [facility] COVID-19 HOME TEST) KIT Use as directed. 06/08/21   Jefm Bryant, RPH  nitrofurantoin,  macrocrystal-monohydrate, (MACROBID) 100 MG capsule TAKE 1 CAPSULE BY MOUTH AS NEEDED TO PREVENT URINARY TRACT INFECTION AFTER INTERCOURSE 11/29/20 11/29/21  Copland, Gay Filler, MD  nitrofurantoin, macrocrystal-monohydrate, (MACROBID) 100 MG capsule TAKE 1 CAPSULE BY MOUTH EVERY 12 HOURS FOR 7 DAYS. 10/26/20 10/26/21  Bobbye Charleston, MD  nitrofurantoin, macrocrystal-monohydrate, (MACROBID) 100 MG capsule TAKE 1 CAPSULE BY MOUTH EVERY 12 HOURS FOR 5 DAYS. 10/10/20 10/10/21  Rowland Lathe, MD  tranexamic acid (LYSTEDA) 650 MG TABS tablet Take by mouth. 06/15/20   [provider]    Family History Family History  Problem Relation Age of Onset    Colon cancer Mother 32   Diabetes Mother    Hypertension Mother    Dementia Mother    Stroke Father 62   Hypertension Brother    Heart disease Brother    Thyroid disease Sister    Colon cancer Sister 30   COPD Sister    Heart disease Sister 36       AMI   Heart disease Brother        multiple AMI   Esophageal cancer Maternal Uncle        dx 60s   Breast cancer Sister        dx early 65s   Bone cancer Half-Brother        paternal; d. late 88s   Esophageal cancer Half-Brother        paternal; d. late 47s   Colon polyps Neg Hx    Stomach cancer Neg Hx    Rectal cancer Neg Hx     Social History Social History   Tobacco Use   Smoking status: Never   Smokeless tobacco: Never  Vaping Use   Vaping Use: Never used  Substance Use Topics   Alcohol use: Yes    Comment: 2 drinks per week    Drug use: No     Allergies   Aspirin and Xiidra [lifitegrast]   Review of Systems Review of Systems Per HPI  Physical Exam Triage Vital Signs ED Triage Vitals  Enc Vitals Group     BP 09/09/21 1345 127/78     Pulse Rate 09/09/21 1345 68     Resp 09/09/21 1345 20     Temp 09/09/21 1345 98.4 F (36.9 C)     Temp Source 09/09/21 1345 Oral     SpO2 09/09/21 1345 97 %     Weight --      Height --      Head Circumference --      Peak Flow --      Pain Score 09/09/21 1352 0     Pain Loc --      Pain Edu? --      Excl. in [place]? --    No data found.  Updated Vital Signs BP 127/78 (BP Location: Right Arm)   Pulse 68   Temp 98.4 F (36.9 C) (Oral)   Resp 20   SpO2 97%   Visual Acuity Right Eye Distance:   Left Eye Distance:   Bilateral Distance:    Right Eye Near:   Left Eye Near:    Bilateral Near:     Physical Exam Constitutional:      General: She is not in acute distress.    Appearance: Normal appearance. She is not toxic-appearing or diaphoretic.  HENT:     Head: Normocephalic and atraumatic.  Eyes:     Extraocular Movements: Extraocular movements  intact.     Conjunctiva/sclera:  Conjunctivae normal.  Pulmonary:     Effort: Pulmonary effort is normal.  Skin:    General: Skin is warm and dry.     Comments: Erythema, mild swelling, purulent drainage noted to lateral portion of skin of left third digit surrounding nail.  Neurovascular intact.  Neurological:     General: No focal deficit present.     Mental Status: She is alert and oriented to person, place, and time. Mental status is at baseline.  Psychiatric:        Mood and Affect: Mood normal.        Behavior: Behavior normal.        Thought Content: Thought content normal.        Judgment: Judgment normal.     UC Treatments / Results  Labs (all labs ordered are listed, but only abnormal results are displayed) Labs Reviewed - No data to display  EKG   Radiology No results found.  Procedures Procedures (including critical care time)  Medications Ordered in UC Medications - No data to display  Initial Impression / Assessment and Plan / UC Course  I have reviewed the triage vital signs and the nursing notes.  Pertinent labs & imaging results that were available during my care of the patient were reviewed by me and considered in my medical decision making (see chart for details).     Differential diagnoses include paronychia or infected ingrown nail.  Patient has fake nails on so unable to visualize nail completely.  Will treat infection with cephalexin x5 days.  Warm Epson salt soaks.  Patient is neurovascularly intact and no red flags seen on exam.  No I&D warranted at this time as it is draining on its own and no place of induration.  Discussed strict return precautions.  Patient verbalized understanding and was agreeable plan.  Patient requesting medication to treat yeast infection as needed as patient is currently taking Macrobid antibiotic for urinary tract infection and is concerned that 2 antibiotics will cause yeast infection.  Diflucan sent for patient as needed.   Advised patient that it may be best for her to complete Macrobid antibiotic before starting cephalexin antibiotic.  Patient voiced understanding. Final Clinical Impressions(s) / UC Diagnoses   Final diagnoses:  Paronychia of finger of left hand     Discharge Instructions      Your finger infection is being treated with cephalexin antibiotic.  You have also been sent Diflucan to take as needed.  Please follow-up with urgent care or primary care if symptoms persist.     ED Prescriptions     Medication Sig Dispense Auth. Provider   cephALEXin (KEFLEX) 500 MG capsule Take 1 capsule (500 mg total) by mouth 4 (four) times daily for 5 days. 20 capsule [place], [place] E, [place]   fluconazole (DIFLUCAN) 150 MG tablet Take 1 tablet (150 mg total) by mouth daily. 1 tablet [place], Michele Rockers, [place]      PDMP not reviewed this encounter.   Teodora Medici, [place] 09/09/21 [phone]

## 2021-09-13 ENCOUNTER — Other Ambulatory Visit (HOSPITAL_COMMUNITY): Payer: Self-pay

## 2021-09-13 MED ORDER — MUPIROCIN 2 % EX OINT
1.0000 "application " | TOPICAL_OINTMENT | Freq: Two times a day (BID) | CUTANEOUS | 0 refills | Status: DC
  Filled 2021-09-13: qty 22, 10d supply, fill #0

## 2021-10-04 ENCOUNTER — Other Ambulatory Visit (HOSPITAL_BASED_OUTPATIENT_CLINIC_OR_DEPARTMENT_OTHER): Payer: Self-pay | Admitting: Family Medicine

## 2021-10-04 DIAGNOSIS — Z1231 Encounter for screening mammogram for malignant neoplasm of breast: Secondary | ICD-10-CM

## 2021-10-08 ENCOUNTER — Encounter (HOSPITAL_BASED_OUTPATIENT_CLINIC_OR_DEPARTMENT_OTHER): Payer: Self-pay

## 2021-10-08 ENCOUNTER — Other Ambulatory Visit: Payer: Self-pay | Admitting: Family Medicine

## 2021-10-08 ENCOUNTER — Other Ambulatory Visit: Payer: Self-pay

## 2021-10-08 ENCOUNTER — Encounter: Payer: Self-pay | Admitting: Family Medicine

## 2021-10-08 ENCOUNTER — Ambulatory Visit (HOSPITAL_BASED_OUTPATIENT_CLINIC_OR_DEPARTMENT_OTHER)
Admission: RE | Admit: 2021-10-08 | Discharge: 2021-10-08 | Disposition: A | Discharge status: Home / Self Care | Payer: No Typology Code available for payment source | Source: Ambulatory Visit

## 2021-10-08 DIAGNOSIS — R2231 Localized swelling, mass and lump, right upper limb: Secondary | ICD-10-CM

## 2021-10-08 DIAGNOSIS — Z1231 Encounter for screening mammogram for malignant neoplasm of breast: Secondary | ICD-10-CM

## 2021-11-29 ENCOUNTER — Ambulatory Visit
Admission: RE | Admit: 2021-11-29 | Discharge: 2021-11-29 | Disposition: A | Discharge status: Home / Self Care | Payer: 59 | Source: Ambulatory Visit | Attending: Family Medicine | Admitting: Family Medicine

## 2021-11-29 ENCOUNTER — Ambulatory Visit
Admission: RE | Admit: 2021-11-29 | Discharge: 2021-11-29 | Disposition: A | Discharge status: Home / Self Care | Payer: No Typology Code available for payment source | Source: Ambulatory Visit | Attending: Family Medicine | Admitting: Family Medicine

## 2021-11-29 DIAGNOSIS — R922 Inconclusive mammogram: Secondary | ICD-10-CM | POA: Diagnosis not present

## 2021-11-29 DIAGNOSIS — N644 Mastodynia: Secondary | ICD-10-CM | POA: Diagnosis not present

## 2021-11-29 DIAGNOSIS — R2231 Localized swelling, mass and lump, right upper limb: Secondary | ICD-10-CM

## 2021-12-11 ENCOUNTER — Other Ambulatory Visit (HOSPITAL_COMMUNITY): Payer: Self-pay

## 2021-12-11 ENCOUNTER — Encounter: Payer: Self-pay | Admitting: Family Medicine

## 2021-12-11 DIAGNOSIS — N390 Urinary tract infection, site not specified: Secondary | ICD-10-CM

## 2021-12-11 MED ORDER — NITROFURANTOIN MONOHYD MACRO 100 MG PO CAPS
ORAL_CAPSULE | ORAL | 2 refills | Status: DC
  Filled 2021-12-11 – 2021-12-19 (×2): qty 60, 60d supply, fill #0

## 2021-12-19 ENCOUNTER — Other Ambulatory Visit (HOSPITAL_COMMUNITY): Payer: Self-pay

## 2021-12-20 ENCOUNTER — Other Ambulatory Visit (HOSPITAL_COMMUNITY): Payer: Self-pay

## 2022-02-19 ENCOUNTER — Other Ambulatory Visit (HOSPITAL_COMMUNITY): Payer: Self-pay

## 2022-02-19 MED ORDER — AMOXICILLIN 500 MG PO CAPS
ORAL_CAPSULE | ORAL | 0 refills | Status: DC
  Filled 2022-02-19: qty 31, 10d supply, fill #0

## 2022-03-26 ENCOUNTER — Other Ambulatory Visit (HOSPITAL_COMMUNITY): Payer: Self-pay

## 2022-03-26 ENCOUNTER — Telehealth: Payer: 59 | Admitting: Nurse Practitioner

## 2022-03-26 DIAGNOSIS — W57XXXA Bitten or stung by nonvenomous insect and other nonvenomous arthropods, initial encounter: Secondary | ICD-10-CM

## 2022-03-26 DIAGNOSIS — S30861A Insect bite (nonvenomous) of abdominal wall, initial encounter: Secondary | ICD-10-CM

## 2022-03-26 MED ORDER — DOXYCYCLINE HYCLATE 100 MG PO TABS
100.0000 mg | ORAL_TABLET | Freq: Two times a day (BID) | ORAL | 0 refills | Status: DC
  Filled 2022-03-26: qty 42, 21d supply, fill #0

## 2022-03-26 NOTE — Progress Notes (Signed)
E-Visit for Tick Bite  Thank you for describing your tick bite, Here is how we plan to help! Based on the information that you shared with me it looks like you have An infected or complicated tick bite that requires a longer course of antibiotics and will need for you to schedule a follow-up visit with a provider.  In most cases a tick bite is painless and does not itch.  Most tick bites in which the tick is quickly removed do not require prescriptions. Ticks can transmit several diseases if they are infected and remain attacked to your skin. Therefore the length that the tick was attached and any symptoms you have experienced after the bite are import to accurately develop your custom treatment plan. In most cases a single dose of doxycycline may prevent the development of a more serious condition.  Based on your information I have Provided a home care guide for tick bites and  instructions on when to call for help. and Your symptoms indicate that you need a longer course of antibiotics and a follow up visit with a provider. I have sent doxycycline 100 mg twice a day for 21 days to the pharmacy that you selected. You will need to schedule a follow up visit with your provider. If you do not have a primary care provider you may use our telehealth physicians on the web at [place]  are associated with illness?  The Wood Tick (dog tick) is the size of a watermelon seed and can sometimes transmit [facility] spotted fever and [place] tick fever.   The Deer Tick (black-legged tick) is between the size of a poppy seed (pin head) and an apple seed, and can sometimes transmit Lyme disease.  A brown to black tick with a white splotch on its back is likely a female Amblyomma americanum ([place] tick). This tick has been associated with Southern Tick Associated illness ( STARI)  Lyme disease has become the most common tick-borne illness in the [place]. The risk of Lyme  disease following a recognized deer tick bite is estimated to be 1%.  The majority of cases of Lyme disease start with a bull's eye rash at the site of the tick bite. The rash can occur days to weeks (typically 7-10 days) after a tick bite. Treatment with antibiotics is indicated if this rash appears. Flu-like symptoms may accompany the rash, including: fever, chills, headaches, muscle aches, and fatigue. Removing ticks promptly may prevent tick borne disease.  What can be used to prevent Tick Bites?  Insect repellant with at leas 20% DEET. Wearing long pants with sock and shoes. Avoiding tall grass and heavily wooded areas. Checking your skin after being outdoors. Shower with a washcloth after outdoor exposures.  HOME CARE ADVICE FOR TICK BITE  Wood Tick Removal:  Use a pair of tweezers and grasp the wood tick close to the skin (on its head). Pull the wood tick straight upward without twisting or crushing it. Maintain a steady pressure until it releases its grip.   If tweezers aren't available, use fingers, a loop of thread around the jaws, or a needle between the jaws for traction.  Note: covering the tick with petroleum jelly, nail polish or rubbing alcohol doesn't work. Neither does touching the tick with a hot or cold object. Tiny Deer Tick Removal:   Needs to be scraped off with a knife blade or credit card edge. Place tick in a sealed container (e.g. glass jar,  zip lock plastic bag), in case your doctor wants to see it. Tick's Head Removal:  If the wood tick's head breaks off in the skin, it must be removed. Clean the skin. Then use a sterile needle to uncover the head and lift it out or scrape it off.  If a very small piece of the head remains, the skin will eventually slough it off. Antibiotic Ointment:  Wash the wound and your hands with soap and water after removal to prevent catching any tick disease.  Apply an over the counter antibiotic ointment (e.g. bacitracin) to the bite  once. Expected Course: Tick bites normally don't itch or hurt. That's why they often go unnoticed. Call Your Doctor If:  You can't remove the tick or the tick's head Fever, a severe head ache, or rash occur in the next 2 weeks Bite begins to look infected Lyme's disease is common in your area You have not had a tetanus in the last 10 years Your current symptoms become worse    MAKE SURE YOU  Understand these instructions. Will watch your condition. Will get help right away if you are not doing well or get worse.    Thank you for choosing an e-visit.  Your e-visit answers were reviewed by a board certified advanced clinical practitioner to complete your personal care plan. Depending upon the condition, your plan could have included both over the counter or prescription medications.  Please review your pharmacy choice. Make sure the pharmacy is open so you can pick up prescription now. If there is a problem, you may contact your provider through [employer] and have the prescription routed to another pharmacy.  Your safety is important to [place]. If you have drug allergies check your prescription carefully.   For the next 24 hours you can use MyChart to ask questions about today's visit, request a non-urgent call back, or ask for a work or school excuse. You will get an email in the next two days asking about your experience. I hope that your e-visit has been valuable and will speed your recovery.   Meds ordered this encounter  Medications   doxycycline (VIBRA-TABS) 100 MG tablet    Sig: Take 1 tablet (100 mg total) by mouth 2 (two) times daily for 21 days.    Dispense:  42 tablet    Refill:  0     I spent approximately 7 minutes reviewing the patient's history, current symptoms and coordinating their plan of care today.

## 2022-04-10 ENCOUNTER — Other Ambulatory Visit (HOSPITAL_COMMUNITY): Payer: Self-pay

## 2022-05-19 NOTE — Progress Notes (Unsigned)
[facility] [address] [place], [place] [address] [phone]  Date:  05/22/2022   Name:  LATONYIA LOPATA   DOB:  [birth year]   MRN:  [id number]  PCP:  Darreld Mclean, MD    Chief Complaint: No chief complaint on file.   History of Present Illness:  DENECIA BRUNETTE is a 56 y.o. very pleasant female patient who presents with the following:  Patient seen today for physical Most recent visit with myself was in January 2022 History of platelet disorder, uses DDAVP nasal spray as needed.  She has seen [facility] hematology in the past, has not needed frequent follow-up.  She is a prior Scientist, physiological for [employer]  Has declined Shingrix due to being vaccinated against varicella Tetanus Most recent labs January 2022 She did a mammogram in January of this year, diagnostic due to concern of a mass in her right axilla.  This was thought to be benign  Colonoscopy 2021 Never smoker Patient Active Problem List   Diagnosis Date Noted   Genetic testing 06/20/2020   Preventive measure 06/13/2020   Family history of breast cancer    Family history of colon cancer    Spinal stenosis of lumbar region 09/22/2015   Vertigo, intermittent 01/09/2015   Hx of adenomatous polyp of colon 09/21/2014   Restless legs syndrome (RLS) 05/18/2014   Platelet dysfunction (HCC) 05/16/2014   Bleeding disorder ([place]) 08/31/2013   Paresthesias 07/19/2013   Hematuria 07/19/2013   Family history of colon cancer requiring screening colonoscopy 04/12/2013   Bleeding gums 04/12/2013   Unspecified vitamin D deficiency 04/12/2013   Palpitations 03/09/2013   Bleeding 03/09/2013   Thyroid disease    Dysplastic nevus     Past Medical History:  Diagnosis Date   Bleeding disorder ([place]) 08/31/2013   Bleeding gums    Clotting disorder ([place])    COVID-19 11/2019   Dysplastic nevus    followed yearly by [facility].   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 melanoma   NASAL FRACTURE SURGERY  1995   post-op hemorrhage   PARTIAL HYSTERECTOMY  11/04/2004   DUB/adenomyosis. Ovaries intact.  [name]   resection dysplastic nevus  2012    Social History   Tobacco Use   Smoking status: Never   Smokeless tobacco: Never  Vaping Use   Vaping Use: Never used  Substance Use Topics   Alcohol use: Yes    Comment: 2 drinks per week    Drug use: No    Family History  Problem Relation Age of Onset   Colon cancer Mother 76   Diabetes Mother    Hypertension Mother    Dementia Mother    Stroke Father 63   Hypertension Brother    Heart disease Brother    Thyroid disease  Sister    Colon cancer Sister 66   COPD Sister    Heart disease Sister 44       AMI   Heart disease Brother        multiple AMI   Esophageal cancer Maternal Uncle        dx 14s   Breast cancer Sister        dx early 51s   Bone cancer Half-Brother        paternal; d. late 73s   Esophageal cancer Half-Brother        paternal; d. late 40s   Colon polyps Neg Hx    Stomach cancer Neg Hx    Rectal cancer Neg Hx     Allergies  Allergen Reactions   Aspirin Other (See Comments)    ASA contraindicated with bleeding disorder   Xiidra [Lifitegrast]     Medication list has been reviewed and updated.  Current Outpatient Medications on File Prior to Visit  Medication Sig Dispense Refill    aminocaproic acid (AMICAR) 500 MG tablet TAKE 4 TABLETS BY MOUTH (2000 MG TOTAL) BY MOUTH 2 TIMES DAILY. TAKE FOR 1 WEEK FOR SURGICAL PROCEDURES.     Aminocaproic Acid 1000 MG TABS Take 2 tablets (2,000 mg total) by mouth 2 (two) times daily. Take for one week for surgcial procedures 60 tablet 0   amoxicillin (AMOXIL) 500 MG capsule Take 2 capsules immediately, then take 1 capsule 3 times daily until all are taken. 31 capsule 0   Cholecalciferol (VITAMIN D3) 1000 units CAPS Take 5,000 mg by mouth daily.     COVID-19 At Home Antigen Test [facility] COVID-19 HOME TEST) KIT Use as directed. 4 each 0   doxycycline (VIBRA-TABS) 100 MG tablet Take 1 tablet (100 mg total) by mouth 2 (two) times daily. 42 tablet 0   fluconazole (DIFLUCAN) 150 MG tablet Take 1 tablet (150 mg total) by mouth daily. 1 tablet 0   mupirocin ointment (BACTROBAN) 2 % Apply a small amount topically to affected area 2 (two) times daily. 22 g 0   nitrofurantoin, macrocrystal-monohydrate, (MACROBID) 100 MG capsule TAKE 1 CAPSULE BY MOUTH AS NEEDED TO PREVENT URINARY TRACT INFECTION AFTER INTERCOURSE 60 capsule 2   tranexamic acid (LYSTEDA) 650 MG TABS tablet Take by mouth.     No current facility-administered medications on file prior to visit.    Review of Systems:  As per HPI- otherwise negative.   Physical Examination: There were no vitals filed for this visit. There were no vitals filed for this visit. There is no height or weight on file to calculate BMI. Ideal Body Weight:    GEN: no acute distress. HEENT: Atraumatic, Normocephalic.  Ears and Nose: No external deformity. CV: RRR, No M/G/R. No JVD. No thrill. No extra heart sounds. PULM: CTA B, no wheezes, crackles, rhonchi. No retractions. No resp. distress. No accessory muscle use. ABD: S, NT, ND, +BS. No rebound. No HSM. EXTR: No c/c/e PSYCH: Normally interactive. Conversant.    Assessment and Plan: *** Physical exam today.  Encouraged healthy diet and  exercise routine Signed Lamar Blinks, MD

## 2022-05-19 NOTE — Patient Instructions (Incomplete)
It was great to see you again today, I will be in touch with your labs

## 2022-05-22 ENCOUNTER — Encounter: Payer: Self-pay | Admitting: Family Medicine

## 2022-05-22 ENCOUNTER — Other Ambulatory Visit (HOSPITAL_COMMUNITY): Payer: Self-pay

## 2022-05-22 ENCOUNTER — Ambulatory Visit (INDEPENDENT_AMBULATORY_CARE_PROVIDER_SITE_OTHER): Payer: 59 | Admitting: Family Medicine

## 2022-05-22 VITALS — BP 110/62 | HR 98 | Temp 97.7°F | Resp 18 | Ht 65.0 in | Wt 169.6 lb

## 2022-05-22 DIAGNOSIS — R232 Flushing: Secondary | ICD-10-CM

## 2022-05-22 DIAGNOSIS — Z23 Encounter for immunization: Secondary | ICD-10-CM

## 2022-05-22 DIAGNOSIS — Z13220 Encounter for screening for lipoid disorders: Secondary | ICD-10-CM | POA: Diagnosis not present

## 2022-05-22 DIAGNOSIS — Z8249 Family history of ischemic heart disease and other diseases of the circulatory system: Secondary | ICD-10-CM | POA: Diagnosis not present

## 2022-05-22 DIAGNOSIS — Z1329 Encounter for screening for other suspected endocrine disorder: Secondary | ICD-10-CM | POA: Diagnosis not present

## 2022-05-22 DIAGNOSIS — E559 Vitamin D deficiency, unspecified: Secondary | ICD-10-CM | POA: Diagnosis not present

## 2022-05-22 DIAGNOSIS — R635 Abnormal weight gain: Secondary | ICD-10-CM

## 2022-05-22 DIAGNOSIS — Z131 Encounter for screening for diabetes mellitus: Secondary | ICD-10-CM

## 2022-05-22 DIAGNOSIS — N390 Urinary tract infection, site not specified: Secondary | ICD-10-CM

## 2022-05-22 DIAGNOSIS — Z0000 Encounter for general adult medical examination without abnormal findings: Secondary | ICD-10-CM

## 2022-05-22 DIAGNOSIS — D691 Qualitative platelet defects: Secondary | ICD-10-CM

## 2022-05-22 LAB — LIPID PANEL
Cholesterol: 246 mg/dL — ABNORMAL HIGH (ref 0–200)
HDL: 84.2 mg/dL (ref >39.00)
LDL Cholesterol: 148 mg/dL — ABNORMAL HIGH (ref 0–99)
NonHDL: 162.22
Total CHOL/HDL Ratio: 3
Triglycerides: 70 mg/dL (ref 0.0–149.0)
VLDL: 14 mg/dL (ref 0.0–40.0)

## 2022-05-22 LAB — VITAMIN D 25 HYDROXY (VIT D DEFICIENCY, FRACTURES): VITD: 39.96 ng/mL (ref 30.00–100.00)

## 2022-05-22 LAB — COMPREHENSIVE METABOLIC PANEL
ALT: 11 U/L (ref 0–35)
AST: 17 U/L (ref 0–37)
Albumin: 4.6 g/dL (ref 3.5–5.2)
Alkaline Phosphatase: 79 U/L (ref 39–117)
BUN: 14 mg/dL (ref 6–23)
CO2: 31 mEq/L (ref 19–32)
Calcium: 9.4 mg/dL (ref 8.4–10.5)
Chloride: 103 mEq/L (ref 96–112)
Creatinine, Ser: 0.67 mg/dL (ref 0.40–1.20)
GFR: 99.35 mL/min (ref >60.00)
Glucose, Bld: 78 mg/dL (ref 70–99)
Potassium: 4.2 mEq/L (ref 3.5–5.1)
Sodium: 141 mEq/L (ref 135–145)
Total Bilirubin: 0.4 mg/dL (ref 0.2–1.2)
Total Protein: 7.6 g/dL (ref 6.0–8.3)

## 2022-05-22 LAB — CBC
HCT: 39.7 % (ref 36.0–46.0)
Hemoglobin: 13.1 g/dL (ref 12.0–15.0)
MCHC: 32.9 g/dL (ref 30.0–36.0)
MCV: 90.4 fl (ref 78.0–100.0)
Platelets: 292 10*3/uL (ref 150.0–400.0)
RBC: 4.4 Mil/uL (ref 3.87–5.11)
RDW: 13.3 % (ref 11.5–15.5)
WBC: 5 10*3/uL (ref 4.0–10.5)

## 2022-05-22 LAB — HEMOGLOBIN A1C: Hgb A1c MFr Bld: 5.4 % (ref 4.6–6.5)

## 2022-05-22 LAB — FOLLICLE STIMULATING HORMONE: FSH: 124 m[IU]/mL

## 2022-05-22 LAB — TSH: TSH: 2.59 u[IU]/mL (ref 0.35–5.50)

## 2022-05-22 MED ORDER — NITROFURANTOIN MONOHYD MACRO 100 MG PO CAPS
100.0000 mg | ORAL_CAPSULE | ORAL | 2 refills | Status: AC | PRN
  Filled 2022-05-22 – 2022-12-18 (×3): qty 60, 60d supply, fill #0

## 2022-05-25 ENCOUNTER — Telehealth (HOSPITAL_BASED_OUTPATIENT_CLINIC_OR_DEPARTMENT_OTHER): Payer: Self-pay

## 2022-05-26 ENCOUNTER — Telehealth: Payer: 59 | Admitting: Nurse Practitioner

## 2022-05-26 DIAGNOSIS — J208 Acute bronchitis due to other specified organisms: Secondary | ICD-10-CM

## 2022-05-26 DIAGNOSIS — B9689 Other specified bacterial agents as the cause of diseases classified elsewhere: Secondary | ICD-10-CM

## 2022-05-26 MED ORDER — PREDNISONE 20 MG PO TABS: 40.0000 mg | ORAL_TABLET | Freq: Every day | ORAL | 0 refills | Status: AC

## 2022-05-26 MED ORDER — PROMETHAZINE-DM 6.25-15 MG/5ML PO SYRP: 5.0000 mL | ORAL_SOLUTION | Freq: Four times a day (QID) | ORAL | 0 refills | Status: DC | PRN

## 2022-05-26 MED ORDER — AZITHROMYCIN 250 MG PO TABS: ORAL_TABLET | ORAL | 0 refills | Status: AC

## 2022-05-26 NOTE — Patient Instructions (Signed)
Deanna [name], thank you for joining Gildardo Pounds, NP for today's virtual visit.  While this provider is not your primary care provider (PCP), if your PCP is located in our provider database this encounter information will be shared with them immediately following your visit.  Consent: (Patient) Deanna [name] provided verbal consent for this virtual visit at the beginning of the encounter.  Current Medications:  Current Outpatient Medications:    azithromycin (ZITHROMAX) 250 MG tablet, Take 2 tablets on day 1, then 1 tablet daily on days 2 through 5, Disp: 6 tablet, Rfl: 0   predniSONE (DELTASONE) 20 MG tablet, Take 2 tablets (40 mg total) by mouth daily with breakfast for 5 days., Disp: 10 tablet, Rfl: 0   promethazine-dextromethorphan (PROMETHAZINE-DM) 6.25-15 MG/5ML syrup, Take 5 mLs by mouth 4 (four) times daily as needed for cough., Disp: 240 mL, Rfl: 0   aminocaproic acid (AMICAR) 500 MG tablet, TAKE 4 TABLETS BY MOUTH (2000 MG TOTAL) BY MOUTH 2 TIMES DAILY. TAKE FOR 1 WEEK FOR SURGICAL PROCEDURES., Disp: , Rfl:    Cholecalciferol (VITAMIN D3) 1000 units CAPS, Take 5,000 mg by mouth daily., Disp: , Rfl:    nitrofurantoin, macrocrystal-monohydrate, (MACROBID) 100 MG capsule, Take 1 capsule (100 mg total) by mouth as needed to prevent urinary tract infection after intercourse, Disp: 60 capsule, Rfl: 2   tranexamic acid (LYSTEDA) 650 MG TABS tablet, Take by mouth., Disp: , Rfl:    Medications ordered in this encounter:  Meds ordered this encounter  Medications   azithromycin (ZITHROMAX) 250 MG tablet    Sig: Take 2 tablets on day 1, then 1 tablet daily on days 2 through 5    Dispense:  6 tablet    Refill:  0    Order Specific Question:   Supervising Provider    Answer:   Noemi Chapel [[id number]]   promethazine-dextromethorphan (PROMETHAZINE-DM) 6.25-15 MG/5ML syrup    Sig: Take 5 mLs by mouth 4 (four) times daily as needed for cough.    Dispense:  240 mL    Refill:  0     Order Specific Question:   Supervising Provider    Answer:   MILLER, BRIAN [[id number]]   predniSONE (DELTASONE) 20 MG tablet    Sig: Take 2 tablets (40 mg total) by mouth daily with breakfast for 5 days.    Dispense:  10 tablet    Refill:  0    Order Specific Question:   Supervising Provider    Answer:   Sabra Heck, BRIAN [[id number]]     *If you need refills on other medications prior to your next appointment, please contact your pharmacy*  Follow-Up: Call back or seek an in-person evaluation if the symptoms worsen or if the condition fails to improve as anticipated.  Other Instructions INSTRUCTIONS: use a humidifier for nasal congestion Drink plenty of fluids, rest and wash hands frequently to avoid the spread of infection Alternate tylenol and Motrin for relief of fever    If you have been instructed to have an in-person evaluation today at a local Urgent Care facility, please use the link below. It will take you to a list of all of our available [facility], including address, phone number and hours of operation. Please do not delay care.  [facility]  If you or a family member do not have a primary care provider, use the link below to schedule a visit and establish care. When you choose a [facility] primary  care physician or advanced practice provider, you gain a long-term partner in health. Find a Primary Care Provider  Learn more about [facility] in-office and virtual care options: [facility]

## 2022-05-26 NOTE — Progress Notes (Signed)
Virtual Visit Consent   Deanna [name], you are scheduled for a virtual visit with a [facility] provider today. Just as with appointments in the office, your consent must be obtained to participate. Your consent will be active for this visit and any virtual visit you may have with one of our providers in the next 365 days. If you have a MyChart account, a copy of this consent can be sent to you electronically.  As this is a virtual visit, video technology does not allow for your provider to perform a traditional examination. This may limit your provider's ability to fully assess your condition. If your provider identifies any concerns that need to be evaluated in person or the need to arrange testing (such as labs, EKG, etc.), we will make arrangements to do so. Although advances in technology are sophisticated, we cannot ensure that it will always work on either your end or our end. If the connection with a video visit is poor, the visit may have to be switched to a telephone visit. With either a video or telephone visit, we are not always able to ensure that we have a secure connection.  By engaging in this virtual visit, you consent to the provision of healthcare and authorize for your insurance to be billed (if applicable) for the services provided during this visit. Depending on your insurance coverage, you may receive a charge related to this service.  I need to obtain your verbal consent now. Are you willing to proceed with your visit today? Deanna [name] has provided verbal consent on 05/26/2022 for a virtual visit (video or telephone). Gildardo Pounds, NP  Date: 05/26/2022 10:51 AM  Virtual Visit via Video Note   I, Gildardo Pounds, connected with  Deanna [name]  ([id number], 05/12/1966) on 05/26/22 at 10:45 AM EDT by a video-enabled telemedicine application and verified that I am speaking with the correct person using two identifiers.  Location: Patient: Virtual Visit Location  Patient: Home Provider: Virtual Visit Location Provider: Home Office   I discussed the limitations of evaluation and management by telemedicine and the availability of in person appointments. The patient expressed understanding and agreed to proceed.    History of Present Illness: Deanna [name] is a 56 y.o. who identifies as a female who was assigned female at birth, and is being seen today for bronchitis.  Acute Bronchitis: Patient presents for presents evaluation of productive cough with sputum described as thick and purulent Symptoms began 3 weeks ago and are gradually worsening since that time.  Past history is significant for no history of pneumonia or bronchitis. She is not a current or former smoker. Does not have a history of allergies. Home medications: Mucinex D, tessalon perles with little relief. Denies fever, malaise, sinus pressure, sore throat or headache. COVID text negative x2.    Problems:  Patient Active Problem List   Diagnosis Date Noted   Genetic testing 06/20/2020   Preventive measure 06/13/2020   Family history of breast cancer    Family history of colon cancer    Spinal stenosis of lumbar region 09/22/2015   Vertigo, intermittent 01/09/2015   Hx of adenomatous polyp of colon 09/21/2014   Restless legs syndrome (RLS) 05/18/2014   Platelet dysfunction ([place]) 05/16/2014   Bleeding disorder ([place]) 08/31/2013   Paresthesias 07/19/2013   Hematuria 07/19/2013   Family history of colon cancer requiring screening colonoscopy 04/12/2013   Bleeding gums 04/12/2013   Unspecified vitamin D deficiency 04/12/2013  Palpitations 03/09/2013   Bleeding 03/09/2013   Thyroid disease    Dysplastic nevus     Allergies:  Allergies  Allergen Reactions   Aspirin Other (See Comments)    ASA contraindicated with bleeding disorder   Xiidra [Lifitegrast]    Medications:  Current Outpatient Medications:    azithromycin (ZITHROMAX) 250 MG tablet, Take 2 tablets on day 1, then 1  tablet daily on days 2 through 5, Disp: 6 tablet, Rfl: 0   predniSONE (DELTASONE) 20 MG tablet, Take 2 tablets (40 mg total) by mouth daily with breakfast for 5 days., Disp: 10 tablet, Rfl: 0   promethazine-dextromethorphan (PROMETHAZINE-DM) 6.25-15 MG/5ML syrup, Take 5 mLs by mouth 4 (four) times daily as needed for cough., Disp: 240 mL, Rfl: 0   aminocaproic acid (AMICAR) 500 MG tablet, TAKE 4 TABLETS BY MOUTH (2000 MG TOTAL) BY MOUTH 2 TIMES DAILY. TAKE FOR 1 WEEK FOR SURGICAL PROCEDURES., Disp: , Rfl:    Cholecalciferol (VITAMIN D3) 1000 units CAPS, Take 5,000 mg by mouth daily., Disp: , Rfl:    nitrofurantoin, macrocrystal-monohydrate, (MACROBID) 100 MG capsule, Take 1 capsule (100 mg total) by mouth as needed to prevent urinary tract infection after intercourse, Disp: 60 capsule, Rfl: 2   tranexamic acid (LYSTEDA) 650 MG TABS tablet, Take by mouth., Disp: , Rfl:   Observations/Objective: Patient is well-developed, well-nourished in no acute distress.  Resting comfortably at home.  Head is normocephalic, atraumatic.  No labored breathing.  Speech is clear and coherent with logical content.  Patient is alert and oriented at baseline.    Assessment and Plan: 1. Acute bronchitis, bacterial - azithromycin (ZITHROMAX) 250 MG tablet; Take 2 tablets on day 1, then 1 tablet daily on days 2 through 5  Dispense: 6 tablet; Refill: 0 - promethazine-dextromethorphan (PROMETHAZINE-DM) 6.25-15 MG/5ML syrup; Take 5 mLs by mouth 4 (four) times daily as needed for cough.  Dispense: 240 mL; Refill: 0 - predniSONE (DELTASONE) 20 MG tablet; Take 2 tablets (40 mg total) by mouth daily with breakfast for 5 days.  Dispense: 10 tablet; Refill: 0 INSTRUCTIONS: use a humidifier for nasal congestion Drink plenty of fluids, rest and wash hands frequently to avoid the spread of infection Alternate tylenol and Motrin for relief of fever   Follow Up Instructions: I discussed the assessment and treatment plan with  the patient. The patient was provided an opportunity to ask questions and all were answered. The patient agreed with the plan and demonstrated an understanding of the instructions.  A copy of instructions were sent to the patient via MyChart unless otherwise noted below.   The patient was advised to call back or seek an in-person evaluation if the symptoms worsen or if the condition fails to improve as anticipated.  Time:  I spent 11 minutes with the patient via telehealth technology discussing the above problems/concerns.    Gildardo Pounds, NP

## 2022-05-30 ENCOUNTER — Other Ambulatory Visit (HOSPITAL_COMMUNITY): Payer: Self-pay

## 2022-05-31 ENCOUNTER — Encounter: Payer: Self-pay | Admitting: Nurse Practitioner

## 2022-10-02 DIAGNOSIS — D2271 Melanocytic nevi of right lower limb, including hip: Secondary | ICD-10-CM | POA: Diagnosis not present

## 2022-10-02 DIAGNOSIS — D2272 Melanocytic nevi of left lower limb, including hip: Secondary | ICD-10-CM | POA: Diagnosis not present

## 2022-10-02 DIAGNOSIS — L814 Other melanin hyperpigmentation: Secondary | ICD-10-CM | POA: Diagnosis not present

## 2022-10-02 DIAGNOSIS — D225 Melanocytic nevi of trunk: Secondary | ICD-10-CM | POA: Diagnosis not present

## 2022-10-02 DIAGNOSIS — D1801 Hemangioma of skin and subcutaneous tissue: Secondary | ICD-10-CM | POA: Diagnosis not present

## 2022-10-02 DIAGNOSIS — L821 Other seborrheic keratosis: Secondary | ICD-10-CM | POA: Diagnosis not present

## 2022-10-02 DIAGNOSIS — D485 Neoplasm of uncertain behavior of skin: Secondary | ICD-10-CM | POA: Diagnosis not present

## 2022-11-05 ENCOUNTER — Other Ambulatory Visit (HOSPITAL_COMMUNITY): Payer: Self-pay

## 2022-11-05 DIAGNOSIS — N760 Acute vaginitis: Secondary | ICD-10-CM | POA: Diagnosis not present

## 2022-11-05 DIAGNOSIS — Z113 Encounter for screening for infections with a predominantly sexual mode of transmission: Secondary | ICD-10-CM | POA: Diagnosis not present

## 2022-11-05 DIAGNOSIS — Z01419 Encounter for gynecological examination (general) (routine) without abnormal findings: Secondary | ICD-10-CM | POA: Diagnosis not present

## 2022-11-05 DIAGNOSIS — R87811 Vaginal high risk human papillomavirus (HPV) DNA test positive: Secondary | ICD-10-CM | POA: Diagnosis not present

## 2022-11-05 DIAGNOSIS — Z1272 Encounter for screening for malignant neoplasm of vagina: Secondary | ICD-10-CM | POA: Diagnosis not present

## 2022-11-05 MED ORDER — ESTRADIOL 0.1 MG/GM VA CREA
TOPICAL_CREAM | VAGINAL | 4 refills | Status: DC
  Filled 2022-11-05: qty 42.5, 90d supply, fill #0

## 2022-11-07 ENCOUNTER — Other Ambulatory Visit (HOSPITAL_COMMUNITY): Payer: Self-pay

## 2022-11-07 MED ORDER — METRONIDAZOLE 0.75 % VA GEL
VAGINAL | 0 refills | Status: DC
  Filled 2022-11-07: qty 70, 5d supply, fill #0

## 2022-11-08 ENCOUNTER — Other Ambulatory Visit (HOSPITAL_COMMUNITY): Payer: Self-pay

## 2022-11-08 MED ORDER — METRONIDAZOLE 500 MG PO TABS
500.0000 mg | ORAL_TABLET | Freq: Two times a day (BID) | ORAL | 0 refills | Status: DC
  Filled 2022-11-08: qty 14, 7d supply, fill #0

## 2022-11-11 LAB — RESULTS CONSOLE HPV: CHL HPV: POSITIVE

## 2022-11-11 LAB — HM PAP SMEAR

## 2022-11-14 ENCOUNTER — Other Ambulatory Visit (HOSPITAL_BASED_OUTPATIENT_CLINIC_OR_DEPARTMENT_OTHER): Payer: Self-pay | Admitting: Obstetrics and Gynecology

## 2022-11-14 DIAGNOSIS — R339 Retention of urine, unspecified: Secondary | ICD-10-CM | POA: Diagnosis not present

## 2022-11-14 DIAGNOSIS — R87622 Low grade squamous intraepithelial lesion on cytologic smear of vagina (LGSIL): Secondary | ICD-10-CM | POA: Diagnosis not present

## 2022-11-14 DIAGNOSIS — R102 Pelvic and perineal pain: Secondary | ICD-10-CM

## 2022-11-18 ENCOUNTER — Ambulatory Visit (HOSPITAL_BASED_OUTPATIENT_CLINIC_OR_DEPARTMENT_OTHER)
Admission: RE | Admit: 2022-11-18 | Discharge: 2022-11-18 | Disposition: A | Discharge status: Home / Self Care | Payer: 59 | Source: Ambulatory Visit | Attending: Obstetrics and Gynecology | Admitting: Obstetrics and Gynecology

## 2022-11-18 DIAGNOSIS — R102 Pelvic and perineal pain: Secondary | ICD-10-CM | POA: Diagnosis not present

## 2022-12-09 ENCOUNTER — Other Ambulatory Visit (HOSPITAL_COMMUNITY): Payer: Self-pay

## 2022-12-09 ENCOUNTER — Other Ambulatory Visit: Payer: Self-pay

## 2022-12-16 ENCOUNTER — Other Ambulatory Visit (HOSPITAL_COMMUNITY): Payer: Self-pay

## 2022-12-18 ENCOUNTER — Other Ambulatory Visit (HOSPITAL_COMMUNITY): Payer: Self-pay

## 2023-01-21 DIAGNOSIS — L821 Other seborrheic keratosis: Secondary | ICD-10-CM | POA: Diagnosis not present

## 2023-06-25 DIAGNOSIS — M47814 Spondylosis without myelopathy or radiculopathy, thoracic region: Secondary | ICD-10-CM | POA: Diagnosis not present

## 2023-06-25 DIAGNOSIS — M9901 Segmental and somatic dysfunction of cervical region: Secondary | ICD-10-CM | POA: Diagnosis not present

## 2023-06-25 DIAGNOSIS — M9902 Segmental and somatic dysfunction of thoracic region: Secondary | ICD-10-CM | POA: Diagnosis not present

## 2023-06-25 DIAGNOSIS — M6240 Contracture of muscle, unspecified site: Secondary | ICD-10-CM | POA: Diagnosis not present

## 2023-06-26 DIAGNOSIS — M9902 Segmental and somatic dysfunction of thoracic region: Secondary | ICD-10-CM | POA: Diagnosis not present

## 2023-06-26 DIAGNOSIS — M9901 Segmental and somatic dysfunction of cervical region: Secondary | ICD-10-CM | POA: Diagnosis not present

## 2023-06-26 DIAGNOSIS — M47814 Spondylosis without myelopathy or radiculopathy, thoracic region: Secondary | ICD-10-CM | POA: Diagnosis not present

## 2023-06-26 DIAGNOSIS — M6240 Contracture of muscle, unspecified site: Secondary | ICD-10-CM | POA: Diagnosis not present

## 2023-06-30 DIAGNOSIS — M9901 Segmental and somatic dysfunction of cervical region: Secondary | ICD-10-CM | POA: Diagnosis not present

## 2023-06-30 DIAGNOSIS — M9902 Segmental and somatic dysfunction of thoracic region: Secondary | ICD-10-CM | POA: Diagnosis not present

## 2023-06-30 DIAGNOSIS — M47814 Spondylosis without myelopathy or radiculopathy, thoracic region: Secondary | ICD-10-CM | POA: Diagnosis not present

## 2023-06-30 DIAGNOSIS — M6240 Contracture of muscle, unspecified site: Secondary | ICD-10-CM | POA: Diagnosis not present

## 2023-07-02 DIAGNOSIS — M9901 Segmental and somatic dysfunction of cervical region: Secondary | ICD-10-CM | POA: Diagnosis not present

## 2023-07-02 DIAGNOSIS — M6240 Contracture of muscle, unspecified site: Secondary | ICD-10-CM | POA: Diagnosis not present

## 2023-07-02 DIAGNOSIS — M9902 Segmental and somatic dysfunction of thoracic region: Secondary | ICD-10-CM | POA: Diagnosis not present

## 2023-07-02 DIAGNOSIS — M47814 Spondylosis without myelopathy or radiculopathy, thoracic region: Secondary | ICD-10-CM | POA: Diagnosis not present

## 2023-07-09 ENCOUNTER — Other Ambulatory Visit: Payer: Self-pay | Admitting: Family Medicine

## 2023-07-09 DIAGNOSIS — M9902 Segmental and somatic dysfunction of thoracic region: Secondary | ICD-10-CM | POA: Diagnosis not present

## 2023-07-09 DIAGNOSIS — M6240 Contracture of muscle, unspecified site: Secondary | ICD-10-CM | POA: Diagnosis not present

## 2023-07-09 DIAGNOSIS — M47814 Spondylosis without myelopathy or radiculopathy, thoracic region: Secondary | ICD-10-CM | POA: Diagnosis not present

## 2023-07-09 DIAGNOSIS — Z1231 Encounter for screening mammogram for malignant neoplasm of breast: Secondary | ICD-10-CM

## 2023-07-09 DIAGNOSIS — M9901 Segmental and somatic dysfunction of cervical region: Secondary | ICD-10-CM | POA: Diagnosis not present

## 2023-07-15 ENCOUNTER — Ambulatory Visit
Admission: RE | Admit: 2023-07-15 | Discharge: 2023-07-15 | Disposition: A | Discharge status: Home / Self Care | Payer: 59 | Source: Ambulatory Visit

## 2023-07-15 DIAGNOSIS — Z1231 Encounter for screening mammogram for malignant neoplasm of breast: Secondary | ICD-10-CM

## 2023-07-15 DIAGNOSIS — M47814 Spondylosis without myelopathy or radiculopathy, thoracic region: Secondary | ICD-10-CM | POA: Diagnosis not present

## 2023-07-15 DIAGNOSIS — M9902 Segmental and somatic dysfunction of thoracic region: Secondary | ICD-10-CM | POA: Diagnosis not present

## 2023-07-15 DIAGNOSIS — M6240 Contracture of muscle, unspecified site: Secondary | ICD-10-CM | POA: Diagnosis not present

## 2023-07-15 DIAGNOSIS — M9901 Segmental and somatic dysfunction of cervical region: Secondary | ICD-10-CM | POA: Diagnosis not present

## 2023-07-16 IMAGING — MG DIGITAL DIAGNOSTIC BILAT W/ TOMO W/ CAD
6 of 12 series · 6 of 36 positions shown · non-contrast
Comparison: Previous exam(s).

CLINICAL DATA: 55-year-old female presenting for evaluation of a
palpable lump in the right axilla, and focal pain in the upper-outer
quadrant of the right breast for 2 months.

EXAM:
DIGITAL DIAGNOSTIC BILATERAL MAMMOGRAM WITH TOMOSYNTHESIS AND CAD;
ULTRASOUND RIGHT BREAST LIMITED
TECHNIQUE: Bilateral digital diagnostic mammography and breast tomosynthesis
was performed. The images were evaluated with computer-aided
detection.; Targeted ultrasound examination of the right breast was
performed

[R CC synth-2D]
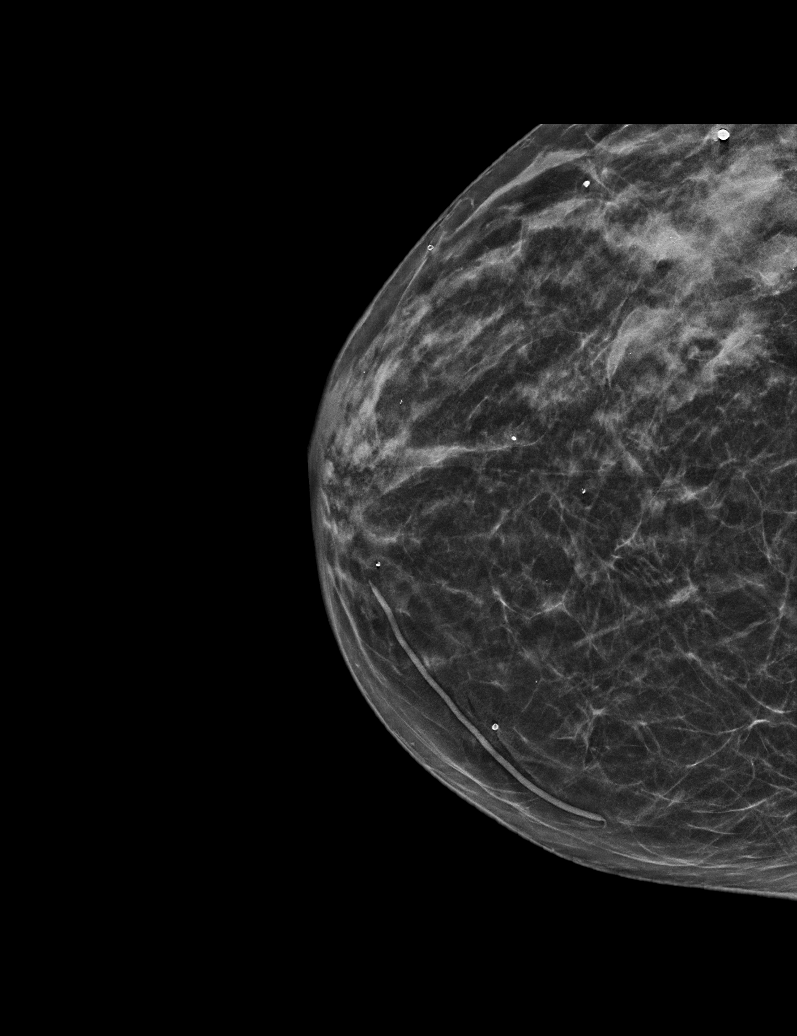

[L MLO synth-2D]
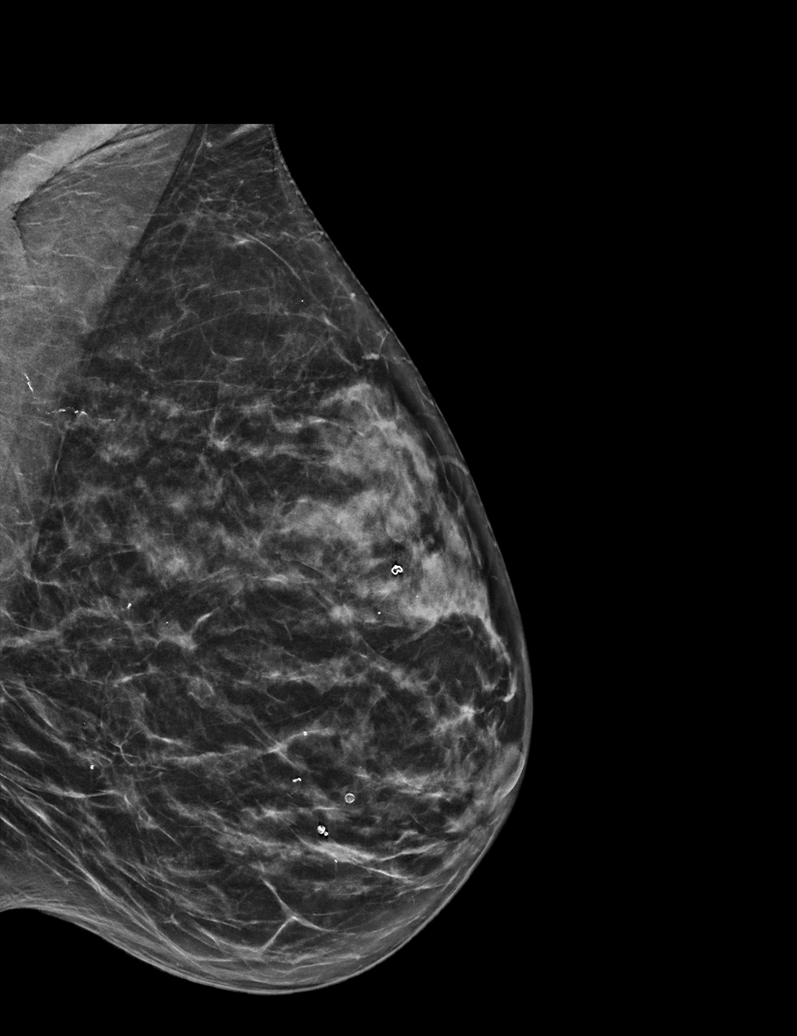

[R TAN synth-2D]
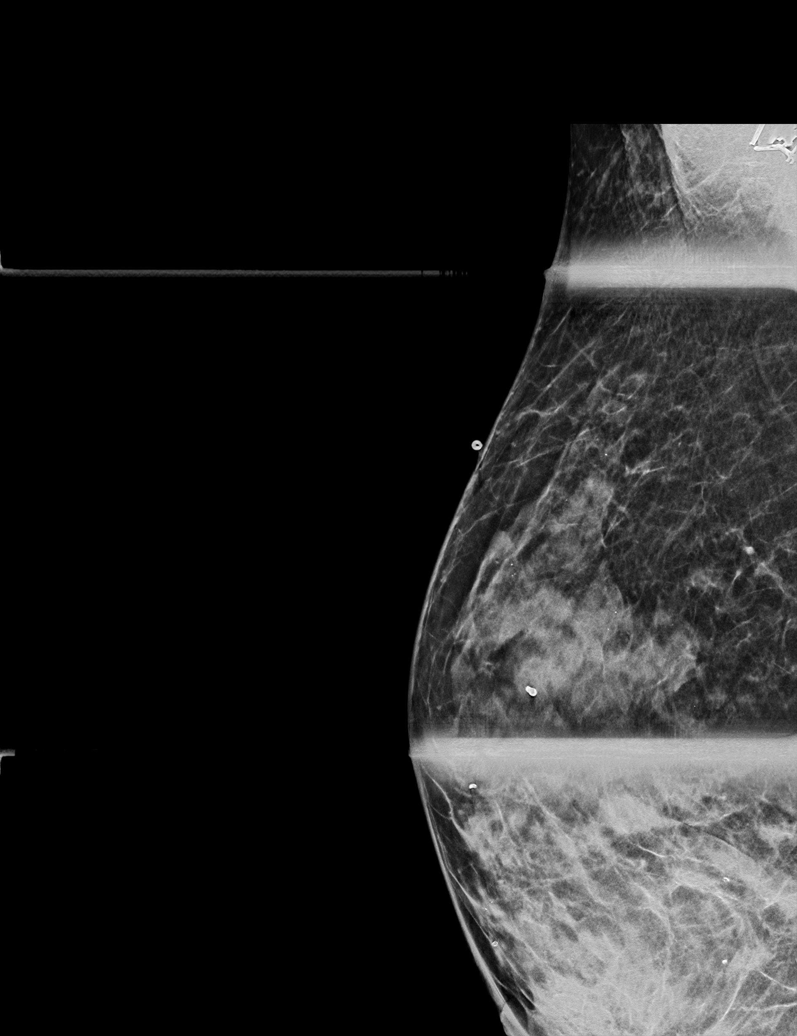

[R MLO synth-2D (1 of 2)]
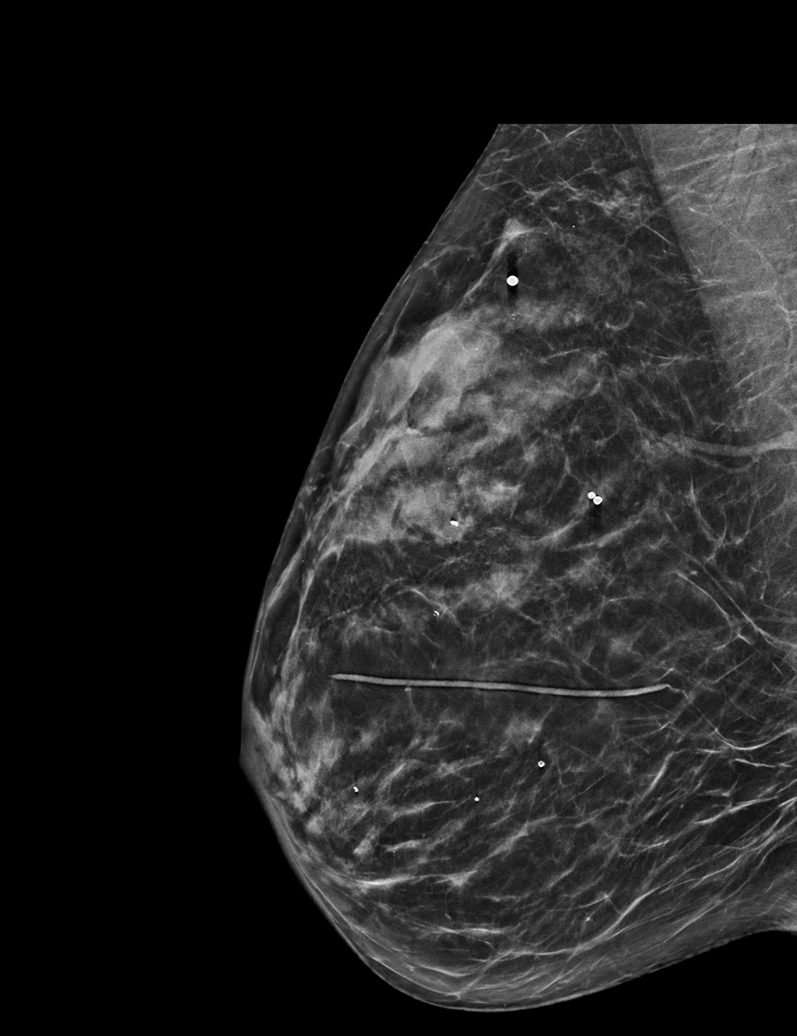

[R MLO synth-2D (2 of 2)]
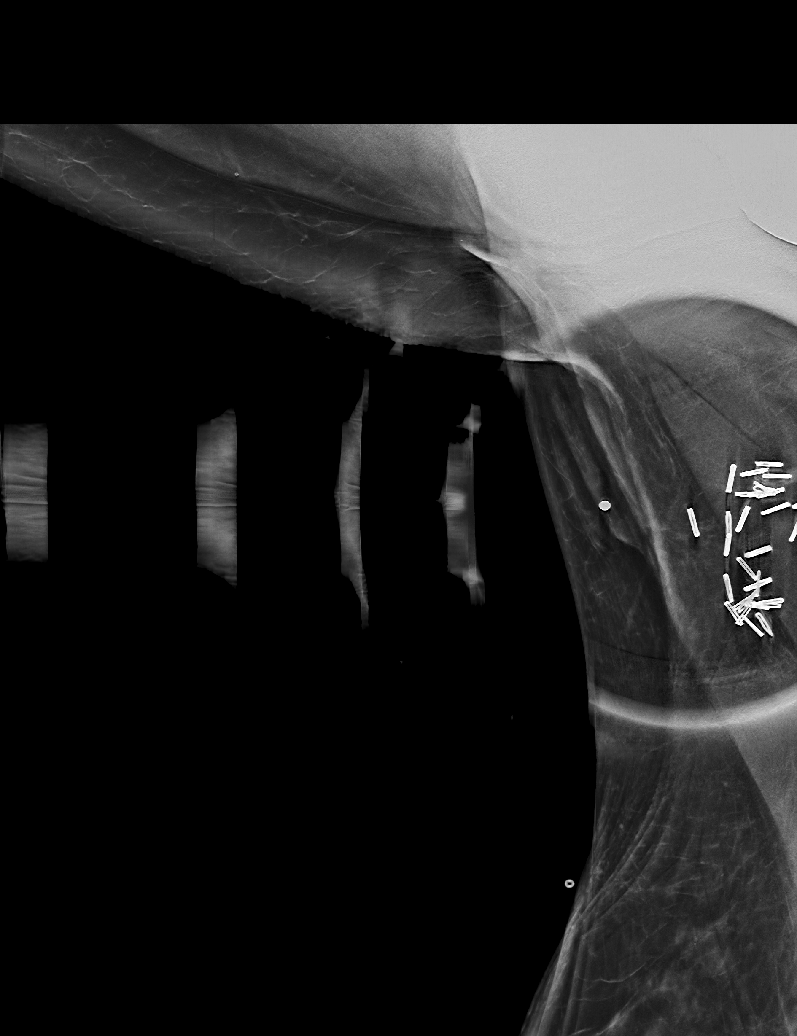

[L CC synth-2D]
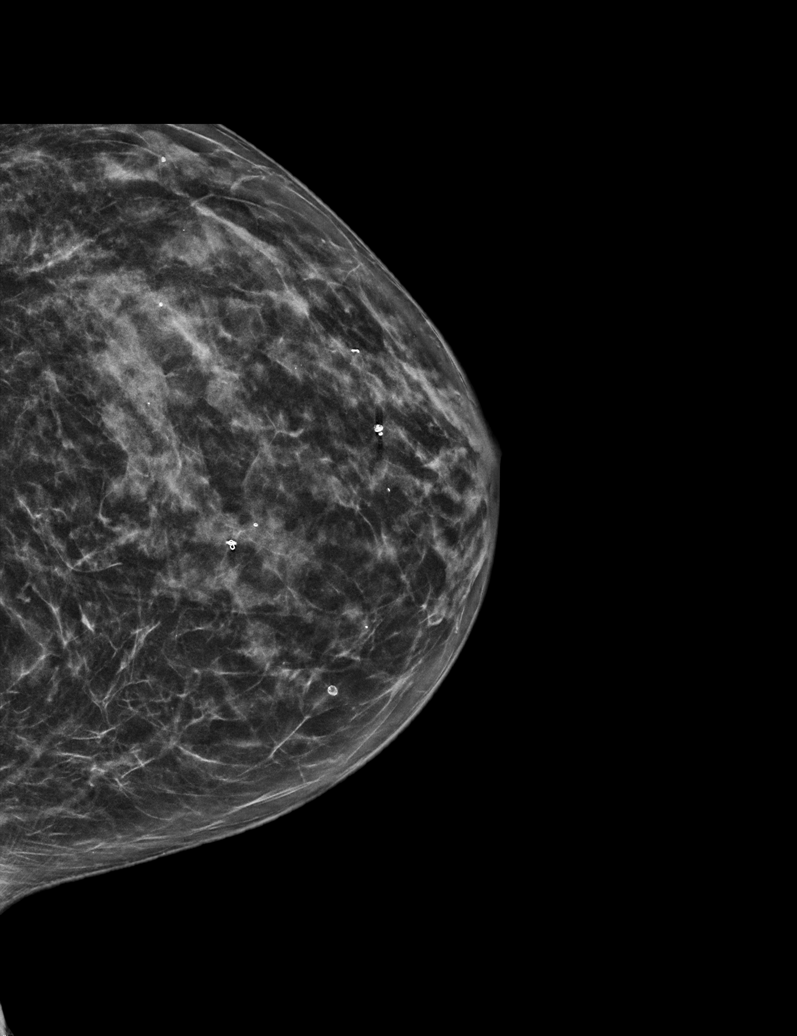

[6 of 36 positions shown; findings below may reference images not displayed]

ACR Breast Density Category c: The breast tissue is heterogeneously
dense, which may obscure small masses.
FINDINGS: Spot compression tomosynthesis images were performed over the site
of focal pain in the upper-outer right breast, and at the site of
the small palpable lump in the right axilla. No suspicious findings
are identified deep to either of these markers. There are numerous
surgical clips within the right axilla from a prior lymph node
dissection. No suspicious calcifications, masses or areas of
distortion are seen in the bilateral breasts.

Physical exam of the palpable site in the right axilla demonstrates
no discrete hard palpable lumps.

Ultrasound targeted to the site of tenderness in the right breast at
11 o'clock, 10 cm from the nipple demonstrates normal dense
fibroglandular tissue. No suspicious masses or areas of shadowing
are identified.

Ultrasound of the right axilla demonstrates a benign-appearing lymph
node with a preserved fatty hilum and thin cortical ribbon. No
suspicious masses or abnormal lymph nodes are identified.
IMPRESSION: 1. There are no suspicious mammographic or targeted sonographic
abnormalities at the site of focal pain in the upper-outer right
breast.

2. There are no suspicious mammographic or targeted sonographic
abnormalities at the palpable site in the right axilla.

3.  No mammographic evidence of malignancy in the bilateral breasts.

RECOMMENDATION:
1. Clinical follow-up recommended for the tender area of concern in
the right breast and the palpable site of concern in the right
axilla. Any further workup should be based on clinical grounds.

2.  Screening mammogram in one year.(Code:[id number]-H-AKS)

I have discussed the findings and recommendations with the patient.
If applicable, a reminder letter will be sent to the patient
regarding the next appointment.

BI-RADS CATEGORY  1: Negative.

## 2023-07-17 DIAGNOSIS — M9902 Segmental and somatic dysfunction of thoracic region: Secondary | ICD-10-CM | POA: Diagnosis not present

## 2023-07-17 DIAGNOSIS — M47814 Spondylosis without myelopathy or radiculopathy, thoracic region: Secondary | ICD-10-CM | POA: Diagnosis not present

## 2023-07-17 DIAGNOSIS — M9901 Segmental and somatic dysfunction of cervical region: Secondary | ICD-10-CM | POA: Diagnosis not present

## 2023-07-17 DIAGNOSIS — M6240 Contracture of muscle, unspecified site: Secondary | ICD-10-CM | POA: Diagnosis not present

## 2023-07-18 ENCOUNTER — Other Ambulatory Visit (HOSPITAL_COMMUNITY): Payer: Self-pay

## 2023-07-18 ENCOUNTER — Other Ambulatory Visit: Payer: Self-pay

## 2023-07-18 ENCOUNTER — Emergency Department (HOSPITAL_BASED_OUTPATIENT_CLINIC_OR_DEPARTMENT_OTHER)
Admission: EM | Admit: 2023-07-18 | Discharge: 2023-07-18 | Disposition: A | Discharge status: Home / Self Care | Payer: 59 | Attending: Emergency Medicine | Admitting: Emergency Medicine

## 2023-07-18 ENCOUNTER — Telehealth: Payer: Self-pay

## 2023-07-18 ENCOUNTER — Emergency Department (HOSPITAL_BASED_OUTPATIENT_CLINIC_OR_DEPARTMENT_OTHER): Payer: 59

## 2023-07-18 ENCOUNTER — Ambulatory Visit: Payer: 59 | Admitting: Physician Assistant

## 2023-07-18 VITALS — BP 128/84 | HR 68 | Temp 98.2°F | Resp 18 | Wt 175.0 lb

## 2023-07-18 DIAGNOSIS — W2209XA Striking against other stationary object, initial encounter: Secondary | ICD-10-CM | POA: Insufficient documentation

## 2023-07-18 DIAGNOSIS — R229 Localized swelling, mass and lump, unspecified: Secondary | ICD-10-CM

## 2023-07-18 DIAGNOSIS — S0990XA Unspecified injury of head, initial encounter: Secondary | ICD-10-CM

## 2023-07-18 DIAGNOSIS — H539 Unspecified visual disturbance: Secondary | ICD-10-CM | POA: Diagnosis not present

## 2023-07-18 DIAGNOSIS — I6502 Occlusion and stenosis of left vertebral artery: Secondary | ICD-10-CM | POA: Diagnosis not present

## 2023-07-18 DIAGNOSIS — R519 Headache, unspecified: Secondary | ICD-10-CM

## 2023-07-18 DIAGNOSIS — H538 Other visual disturbances: Secondary | ICD-10-CM | POA: Diagnosis not present

## 2023-07-18 LAB — COMPREHENSIVE METABOLIC PANEL
ALT: 14 U/L (ref 0–44)
AST: 19 U/L (ref 15–41)
Albumin: 4.1 g/dL (ref 3.5–5.0)
Alkaline Phosphatase: 61 U/L (ref 38–126)
Anion gap: 6 (ref 5–15)
BUN: 13 mg/dL (ref 6–20)
CO2: 29 mmol/L (ref 22–32)
Calcium: 9.1 mg/dL (ref 8.9–10.3)
Chloride: 102 mmol/L (ref 98–111)
Creatinine, Ser: 0.77 mg/dL (ref 0.44–1.00)
GFR, Estimated: >60 mL/min (ref >60)
Glucose, Bld: 86 mg/dL (ref 70–99)
Potassium: 4 mmol/L (ref 3.5–5.1)
Sodium: 137 mmol/L (ref 135–145)
Total Bilirubin: 0.5 mg/dL (ref 0.3–1.2)
Total Protein: 7.4 g/dL (ref 6.5–8.1)

## 2023-07-18 LAB — CBC
HCT: 38.3 % (ref 36.0–46.0)
Hemoglobin: 12.6 g/dL (ref 12.0–15.0)
MCH: 29.7 pg (ref 26.0–34.0)
MCHC: 32.9 g/dL (ref 30.0–36.0)
MCV: 90.3 fL (ref 80.0–100.0)
Platelets: 264 10*3/uL (ref 150–400)
RBC: 4.24 MIL/uL (ref 3.87–5.11)
RDW: 12.8 % (ref 11.5–15.5)
WBC: 4.6 10*3/uL (ref 4.0–10.5)
nRBC: 0 % (ref 0.0–0.2)

## 2023-07-18 LAB — SEDIMENTATION RATE: Sed Rate: 10 mm/h (ref 0–22)

## 2023-07-18 MED ORDER — ONDANSETRON 4 MG PO TBDP
4.0000 mg | ORAL_TABLET | Freq: Three times a day (TID) | ORAL | 0 refills | Status: DC | PRN
  Filled 2023-07-18: qty 20, 7d supply, fill #0

## 2023-07-18 MED ORDER — IOHEXOL 350 MG/ML SOLN
100.0000 mL | Freq: Once | INTRAVENOUS | Status: AC | PRN
  Administered 2023-07-18: 75 mL via INTRAVENOUS

## 2023-07-18 NOTE — ED Triage Notes (Signed)
Patient presents to ED via POV from home. Here after hitting her head on a counter after bending down to pick something up. Denies LOC. Denies being on blood thinners.

## 2023-07-18 NOTE — ED Provider Notes (Signed)
[place] [facility] Provider Note   CSN: [id number] Arrival date & time: 07/18/23  1136     History {Add pertinent medical, surgical, social history, OB history to HPI:1} Chief Complaint  Patient presents with   Head Injury    Deanna [name] is a 57 y.o. female.   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  Patient initially went to her PCP today who sent her here.  She states couple weeks ago she stood up and hit her head on the counter.  She did not fall or lose consciousness.  She was not seen at the time.  She has had intermittent headaches since then, usually right-sided.  They have not worsened.  However today after she got a shower she noticed a slight bulging to her right temple over an area where she has prominent superficial veins.  It did not hurt, was slightly red.  It is since resolved.  She has mild headache in the right side of her head.  She has not had any fevers or chills or trauma since the initial injury.  She was seen at her PCP who sent her here for evaluation.  She has had some intermittent vision changes in her right eye.  She feels like sometimes her right eye becomes blurry.  Been going on for several weeks since the injury, she is not sure if she has had symptoms in her left eye she has worse vision in her left eye at baseline.  No weakness or numbness.     Home Medications Prior to Admission medications   Medication Sig Start Date End Date Taking? Authorizing Provider  aminocaproic acid (AMICAR) 500 MG tablet TAKE 4 TABLETS BY MOUTH (2000 MG TOTAL) BY MOUTH 2 TIMES DAILY. TAKE FOR 1 WEEK FOR SURGICAL PROCEDURES. 05/29/20   [provider]  Cholecalciferol (VITAMIN D3) 1000 units CAPS Take 5,000 mg by mouth daily.    [provider]  estradiol (ESTRACE) 0.1 MG/GM vaginal cream Insert pea size amount into vagina every night for 2 weeks,  then twice weekly for maintenance 11/05/22     nitrofurantoin, macrocrystal-monohydrate, (MACROBID) 100 MG capsule Take 1 capsule (100 mg total) by mouth as needed to prevent urinary tract infection after intercourse 05/22/22   Copland, Gwenlyn Found, MD  tranexamic acid (LYSTEDA) 650 MG TABS tablet Take by mouth. 06/15/20   [provider]      Allergies    Aspirin and Xiidra [lifitegrast]    Review of Systems   Review of Systems Review of systems completed and notable as per HPI.  ROS otherwise negative.   Physical Exam Updated Vital Signs BP (!) 149/85 (BP Location: Left Arm)   Pulse 61   Temp 98.1 F (36.7 C) (Oral)   Resp 17   SpO2 100%  Physical Exam Vitals and nursing note reviewed.  Constitutional:      General: She is not in acute distress.    Appearance: She is well-developed.  HENT:     Head: Normocephalic and atraumatic.     Comments: Patient has prominent superficial vessels bilaterally over the temples which she states are chronic and not acutely changed.  No hematoma or skin changes.  No temporal artery tenderness.    Mouth/Throat:     Mouth: Mucous membranes are moist.     Pharynx: Oropharynx is clear.  Eyes:     Extraocular Movements: Extraocular movements intact.  Conjunctiva/sclera: Conjunctivae normal.     Pupils: Pupils are equal, round, and reactive to light.  Cardiovascular:     Rate and Rhythm: Normal rate and regular rhythm.     Heart sounds: No murmur heard. Pulmonary:     Effort: Pulmonary effort is normal. No respiratory distress.     Breath sounds: Normal breath sounds.  Abdominal:     Palpations: Abdomen is soft.     Tenderness: There is no abdominal tenderness. There is no guarding or rebound.  Musculoskeletal:        General: No swelling.     Cervical back: Normal range of motion and neck supple. No rigidity.     Right lower leg: No edema.     Left lower leg: No edema.  Skin:    General: Skin is warm and dry.     Capillary  Refill: Capillary refill takes less than 2 seconds.  Neurological:     General: No focal deficit present.     Mental Status: She is alert and oriented to person, place, and time. Mental status is at baseline.     Cranial Nerves: No cranial nerve deficit.     Sensory: No sensory deficit.     Motor: No weakness.     Coordination: Coordination normal.     Deep Tendon Reflexes: Reflexes normal.  Psychiatric:        Mood and Affect: Mood normal.     ED Results / Procedures / Treatments   Labs (all labs ordered are listed, but only abnormal results are displayed) Labs Reviewed - No data to display  EKG None  Radiology No results found.  Procedures Procedures  {Document cardiac monitor, telemetry assessment procedure when appropriate:1}  Medications Ordered in ED Medications - No data to display  ED Course/ Medical Decision Making/ A&P   {   Click here for ABCD2, HEART and other calculatorsREFRESH Note before signing :1}                              Medical Decision Making  Medical Decision Making:   Deanna [name] is a 57 y.o. female who presented to the ED today with headache, vision changes after recent head trauma.  Vital signs reviewed.  Exam she is well-appearing.  She does have prominent vasculature over her temples bilaterally.  As she should be a picture of her temple earlier which did show a slightly enlarged vessel.  This is since resolved and was not associate with any trauma today.  She also reports recent new headaches as well as some occasional vision changes in her right eye since her recent head trauma.  She is history of qualitative platelet disorder which would put her at risk for intracranial bleeding from trauma.  Unclear if this could have been some sort of arterial abnormality as well.  I consider denies arteritis, she is relatively young for this, however given arterial abnormality near her temple and new headache with vision changes in the right eye will  obtain ESR as well as CT scan.   {crccomplexity:27900} Reviewed and confirmed nursing documentation for past medical history, family history, social history.  Reassessment and Plan:   ***    Patient's presentation is most consistent with {EM COPA:27473}     {Document critical care time when appropriate:1} {Document review of labs and clinical decision tools ie heart score, Chads2Vasc2 etc:1}  {Document your independent review of radiology images, and any  outside records:1} {Document your discussion with family members, caretakers, and with consultants:1} {Document social determinants of health affecting pt's care:1} {Document your decision making why or why not admission, treatments were needed:1} Final Clinical Impression(s) / ED Diagnoses Final diagnoses:  None    Rx / DC Orders ED Discharge Orders     None

## 2023-07-18 NOTE — Telephone Encounter (Signed)
Pt is scheduled to see Alfredia Ferguson today 07/18/23 at 11 am. She also has a pending CPE with Dr Patsy Lager on 08/04/23 at 9:40 am.    Patient Name: Deanna [name] DOB: [birth year] Age: 57 Y 2 M 9 D [id number] Client [name] - Client Client Site [facility] - Day Provider Copland, Shanda Bumps- MD Contact Type Call Who Is Calling Patient / Member / Family / Caregiver Call Type Triage / Clinical Relationship To Patient Self Return Phone Number [phone] (Primary) Chief Complaint HEAD INJURY - and not acting right. Change in behaviour after hitting head. Reason for Call Symptomatic / Request for Health Information Initial Comment Caller states she is having an issues, she is needing to be seen today. Caller states she has a bleeding disorder and yesterday she was not feeling well but the last couple weeks she has been having some headaches. Caller states she has some swelling last with with a mild headache and nausea. Caller states 2 to 3 weeks ago she hit her counter top and she had a goose egg. Translation No Nurse Assessment Nurse: Andrey Campanile, RN, Marylene Land Date/Time (Eastern Time): 07/18/2023 8:07:03 AM Confirm and document reason for call. If symptomatic, describe symptoms. ---Caller states she has a bleeding(platelet) disorder and yesterday she was not feeling well but the last couple weeks she has been having some headaches. Caller states she has some swelling last with with a mild headache and nausea. Quarter size swelling on right side of temple, no injury. Caller states 2 to 3 weeks ago she hit her counter top and she had a goose egg. Intermittent sharp pains on right side of head since then and pain behind her eye.States he did not see an MD afterwards. Does the patient have any new or worsening symptoms? ---Yes Will a triage be completed? ---Yes Related visit to physician within the last 2 weeks? ---No Does the PT have any  chronic conditions? (i.e. diabetes, asthma, this includes High risk factors for pregnancy, etc.) ---Yes List chronic conditions. ---Platelet Disorder. Is this a behavioral health or substance abuse call? ---No PLEASE NOTE: All timestamps contained within this report are represented as [place] Standard Time. CONFIDENTIALTY NOTICE: This fax transmission is intended only for the addressee. It contains information that is legally privileged, confidential or otherwise protected from use or disclosure. If you are not the intended recipient, you are strictly prohibited from reviewing, disclosing, copying using or disseminating any of this information or taking any action in reliance on or regarding this information. If you have received this fax in error, please notify us immediately by telephone so that we can arrange for its return to [place]. Phone: [phone], Toll-Free: [phone], Fax: [phone] Page: 2 of 2 Call Id: [id number] Guidelines Guideline Title Affirmed Question Affirmed Notes Nurse Date/Time Lamount Cohen Time) Headache Severe pain in one eye Andrey Campanile, RN, Marylene Land 07/18/2023 8:12:28 AM Disp. Time Lamount Cohen Time) Disposition Final User 07/18/2023 8:06:08 AM Send to Urgent Marrian Salvage, [place] 07/18/2023 8:21:13 AM Go to ED Now Yes Andrey Campanile, RN, Marylene Land Final Disposition 07/18/2023 8:21:13 AM Go to ED Now Yes Andrey Campanile, RN, Rosalyn Charters Disagree/Comply Disagree Caller Understands Yes PreDisposition Did not know what to do Care Advice Given Per Guideline GO TO ED NOW: * You need to be seen in the Emergency Department. Comments User: Donney Dice, RN Date/Time Lamount Cohen Time): 07/18/2023 8:21:42 AM Called office, gave report, transferred caller to [facility]. Referrals GO TO FACILITY REFUSED

## 2023-07-18 NOTE — ED Notes (Signed)
D/c paperwork reviewed with pt, including prescriptions and follow up care.  All questions and/or concerns addressed at time of d/c.  No further needs expressed. . Pt verbalized understanding, Ambulatory without assistance to ED exit, NAD.

## 2023-07-18 NOTE — ED Provider Notes (Signed)
4:15 PM Assumed care of patient from off-going team. For more details, please see note from same day.  In brief, this is a 57 y.o. female w/ h/o qualitative platelet disorder who had head trauma last week, since then with intermittent unilateral visual changes headaches in R eye. Has swollen R temporal artery, which is now resolved. ESR is resolved, no temporal TTP, low c/f GCA.   Plan/Dispo at time of sign-out & ED Course since sign-out: [ ]  CTA. If okay could f/u with ophtho o/p  BP 126/88   Pulse 65   Temp 98.1 F (36.7 C) (Oral)   Resp 18   SpO2 99%    ED Course:   Clinical Course as of 07/18/23 1615  Fri Jul 18, 2023  1556 CT Angio Head Neck W WO CM 1. No acute intracranial abnormality. 2. No intracranial large vessel occlusion. 3. 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 [HN]  1612 Discussed with patient.  She did normal CT head and neck which is very reassuring.  She states her headache is just mild.  Headaches always located on the right side and are throbbing in nature.  Her intermittent monocular visual changes she describes as a "shade coming down" of blurry vision over her right eye.  However this is intermittent and does not last very long when it occurs.  She has never had this happen before.  She does have differences in her vision between the right and the left and her right eye is her "good eye."  Because of the description of the visual changes I did perform a bedside ultrasound which did not demonstrate any retinal detachment or hemorrhage, posterior vitreous detachment or hemorrhage.  It is possible patient is having postconcussive symptoms with headache and visual changes however would be unusual to have only monocular visual changes.  I recommend that patient follow-up with her ophthalmologist for visual evaluation and with her PCP for possible postconcussive syndrome or migraines.  I also suggested she could stay well-hydrated and because she feels  queasy today will give her a Zofran ODT prescription to go home and ensure she stays well-hydrated.  Also suggest that she can follow with a neurologist or headache specialist if her symptoms continue or do not improve. DC w/ discharge instructions/return precautions. All questions answered to patient's satisfaction.   [HN]    Clinical Course User Index [HN] Loetta Rough, MD    Dispo: DC ------------------------------- Vivi Barrack, MD Emergency Medicine  This note was created using dictation software, which may contain spelling or grammatical errors.   Loetta Rough, MD 07/18/23 [phone]

## 2023-07-18 NOTE — Progress Notes (Signed)
Established patient visit   Patient: Deanna [name]   DOB: [birth year]   57 y.o. Female  MRN: [id number] Visit Date: 07/18/2023  Today's healthcare provider: Alfredia Ferguson, PA-C   Cc. Head injury, fatigue, headache, vision changes  Subjective    HPI  Discussed the use of AI scribe software for clinical note transcription with the patient, who gave verbal consent to proceed.  History of Present Illness   The patient, with a known qualitative platelet bleeding disorder, presents with persistent head pain and visual disturbances following a head bump that occurred two to three weeks ago. The patient reports experiencing sharp, intermittent pains in the head, primarily in the region where the head was hit. on the right front/temporal area. The pain sometimes radiates to the scalp and behind the eye. Accompanying these pains are mild headaches 2/10 that have been persistent since the incident.  The patient also reports a sensation of something covering the top of their right eye, which they describe as if "someone's kind of shade covering the top of my eye." This sensation, which occurs intermittently throughout the day, impedes their vision, particularly when working on the computer. Not present all the time.  In addition to these symptoms, the patient has been feeling drained, washed out, and queasy since the small head injury. They also experienced some dizziness the night before the consultation.    The patient also reports a sudden swelling in a blood vessel on their right temple, which they noticed after a shower. The swelling, which was about the size of a nickel, gradually subsided by the next morning.       Medications: Outpatient Medications Prior to Visit  Medication Sig   aminocaproic acid (AMICAR) 500 MG tablet TAKE 4 TABLETS BY MOUTH (2000 MG TOTAL) BY MOUTH 2 TIMES DAILY. TAKE FOR 1 WEEK FOR SURGICAL PROCEDURES.   Cholecalciferol (VITAMIN D3) 1000 units CAPS Take  5,000 mg by mouth daily.   estradiol (ESTRACE) 0.1 MG/GM vaginal cream Insert pea size amount into vagina every night for 2 weeks, then twice weekly for maintenance   nitrofurantoin, macrocrystal-monohydrate, (MACROBID) 100 MG capsule Take 1 capsule (100 mg total) by mouth as needed to prevent urinary tract infection after intercourse   tranexamic acid (LYSTEDA) 650 MG TABS tablet Take by mouth.   [DISCONTINUED] metroNIDAZOLE (FLAGYL) 500 MG tablet Take 1 tablet (500 mg total) by mouth 2 (two) times daily for 7 days   [DISCONTINUED] metroNIDAZOLE (METROGEL) 0.75 % vaginal gel Insert 1 applicatorful every day by vaginal route at bedtime for 5 days.   [DISCONTINUED] promethazine-dextromethorphan (PROMETHAZINE-DM) 6.25-15 MG/5ML syrup Take 5 mLs by mouth 4 (four) times daily as needed for cough.   No facility-administered medications prior to visit.    Review of Systems  Constitutional:  Positive for fatigue. Negative for fever.  Eyes:  Positive for visual disturbance.  Respiratory:  Negative for cough and shortness of breath.   Cardiovascular:  Negative for chest pain and leg swelling.  Gastrointestinal:  Positive for nausea. Negative for abdominal pain.  Neurological:  Positive for headaches. Negative for dizziness.      Objective    BP 128/84 (BP Location: Left Arm, Patient Position: Sitting, Cuff Size: Normal)   Pulse 68   Temp 98.2 F (36.8 C) (Oral)   Resp 18   Wt 175 lb (79.4 kg)   SpO2 99%   BMI 29.12 kg/m    Physical Exam Vitals reviewed.  Constitutional:  Appearance: She is not ill-appearing.  HENT:     Head: Normocephalic.  Eyes:     Extraocular Movements: Extraocular movements intact.     Conjunctiva/sclera: Conjunctivae normal.     Pupils: Pupils are equal, round, and reactive to light.     Comments: No nystagmus appreciated  Cardiovascular:     Rate and Rhythm: Normal rate.  Pulmonary:     Effort: Pulmonary effort is normal. No respiratory distress.   Skin:    Comments: Very mild edema to Rt temple compared to Left  nontender  Neurological:     General: No focal deficit present.     Mental Status: She is alert and oriented to person, place, and time.     Sensory: No sensory deficit.     Motor: No weakness.     Coordination: Coordination normal.     Comments: Grip strength, upper extremity sensation, facial sensation equal and normal  Facial movements intact and appropriate    Psychiatric:        Mood and Affect: Mood normal.        Behavior: Behavior normal.      No results found for any visits on 07/18/23.  Assessment & Plan     1. Mild closed head injury, initial encounter 2. Vision disturbance Mild headache, nausea, and vision changes following a head injury 2-3 weeks ago. Concern due to patient's qualitative bleeding disorder. No neuro deficits appreciated.  -Refer to ER for urgent head CT scan to rule out intracranial bleeding. Vision changes described as a 'shade over the top of her right eye', concern for  amaurosis fugax. -Refer to Neurology for further evaluation and possible MRI.  3. Soft tissue swelling Sudden onset of swelling in the scalp at her right temple, not at the site of head injury. Swelling has since resolved. -Observe for recurrence. If persistent, consider further evaluation for possible vasculitis.   Pt was walked down to the emergency room today by Rosita Kea, CMA.  Referrals for neuro were placed, but advised need ED visit for head CT r/o bleed.  I, Alfredia Ferguson, PA-C have reviewed all documentation for this visit. The documentation on  07/18/23   for the exam, diagnosis, procedures, and orders are all accurate and complete.    Alfredia Ferguson, PA-C  [facility] [phone] (phone) [phone] (fax)  [facility]

## 2023-07-18 NOTE — Discharge Instructions (Addendum)
Thank you for coming to [facility] Emergency Department. You were seen for intermittent right sided visual disturbances and right-sided headache. We did an exam, labs, and imaging, and these showed no acute findings.  We also did an ultrasound of your eye that did not demonstrate any evidence of retinal detachment or posterior vitreous detachment.  Please follow-up with your primary care physician within 1 to 2 weeks for further workup and evaluation.  Please follow-up with your ophthalmologist within 1 week for evaluation of your right visual disturbances. You can take tylenol 1g every 8 hours for headache.  We have also prescribed Zofran 4 mg under the tongue as needed every 6-8 hours for nausea and vomiting.  Please make sure you stay well-hydrated at home.  Do not hesitate to return to the ED or call 911 if you experience: -Worsening symptoms -Worsening visual changes or visual changes that occur in both eyes -Black vision, double vision -Severe acute onset "thunderclap" headache -Numbness tingling, asymmetric weakness, facial droop, slurred speech, confusion -Lightheadedness, passing out -Fevers/chills -Anything else that concerns you

## 2023-07-22 DIAGNOSIS — M9902 Segmental and somatic dysfunction of thoracic region: Secondary | ICD-10-CM | POA: Diagnosis not present

## 2023-07-22 DIAGNOSIS — M9901 Segmental and somatic dysfunction of cervical region: Secondary | ICD-10-CM | POA: Diagnosis not present

## 2023-07-22 DIAGNOSIS — M47814 Spondylosis without myelopathy or radiculopathy, thoracic region: Secondary | ICD-10-CM | POA: Diagnosis not present

## 2023-07-22 DIAGNOSIS — M6240 Contracture of muscle, unspecified site: Secondary | ICD-10-CM | POA: Diagnosis not present

## 2023-07-24 DIAGNOSIS — M9902 Segmental and somatic dysfunction of thoracic region: Secondary | ICD-10-CM | POA: Diagnosis not present

## 2023-07-24 DIAGNOSIS — M47814 Spondylosis without myelopathy or radiculopathy, thoracic region: Secondary | ICD-10-CM | POA: Diagnosis not present

## 2023-07-24 DIAGNOSIS — M9901 Segmental and somatic dysfunction of cervical region: Secondary | ICD-10-CM | POA: Diagnosis not present

## 2023-07-24 DIAGNOSIS — M6240 Contracture of muscle, unspecified site: Secondary | ICD-10-CM | POA: Diagnosis not present

## 2023-07-28 ENCOUNTER — Encounter: Payer: 59 | Admitting: Family Medicine

## 2023-08-04 ENCOUNTER — Ambulatory Visit (INDEPENDENT_AMBULATORY_CARE_PROVIDER_SITE_OTHER): Payer: 59 | Admitting: Family Medicine

## 2023-08-04 DIAGNOSIS — Z1329 Encounter for screening for other suspected endocrine disorder: Secondary | ICD-10-CM

## 2023-08-04 DIAGNOSIS — Z13220 Encounter for screening for lipoid disorders: Secondary | ICD-10-CM

## 2023-08-04 DIAGNOSIS — D691 Qualitative platelet defects: Secondary | ICD-10-CM

## 2023-08-04 DIAGNOSIS — Z538 Procedure and treatment not carried out for other reasons: Secondary | ICD-10-CM

## 2023-08-04 DIAGNOSIS — Z131 Encounter for screening for diabetes mellitus: Secondary | ICD-10-CM

## 2023-08-04 DIAGNOSIS — Z0000 Encounter for general adult medical examination without abnormal findings: Secondary | ICD-10-CM

## 2023-08-06 ENCOUNTER — Ambulatory Visit (INDEPENDENT_AMBULATORY_CARE_PROVIDER_SITE_OTHER): Payer: 59 | Admitting: Neurology

## 2023-08-06 ENCOUNTER — Other Ambulatory Visit (HOSPITAL_COMMUNITY): Payer: Self-pay

## 2023-08-06 ENCOUNTER — Encounter: Payer: Self-pay | Admitting: Neurology

## 2023-08-06 VITALS — BP 135/87 | HR 68 | Ht 65.0 in | Wt 179.0 lb

## 2023-08-06 DIAGNOSIS — R519 Headache, unspecified: Secondary | ICD-10-CM

## 2023-08-06 DIAGNOSIS — G43109 Migraine with aura, not intractable, without status migrainosus: Secondary | ICD-10-CM

## 2023-08-06 DIAGNOSIS — G4486 Cervicogenic headache: Secondary | ICD-10-CM | POA: Diagnosis not present

## 2023-08-06 DIAGNOSIS — R0789 Other chest pain: Secondary | ICD-10-CM

## 2023-08-06 DIAGNOSIS — G8929 Other chronic pain: Secondary | ICD-10-CM | POA: Diagnosis not present

## 2023-08-06 DIAGNOSIS — R351 Nocturia: Secondary | ICD-10-CM | POA: Diagnosis not present

## 2023-08-06 DIAGNOSIS — H538 Other visual disturbances: Secondary | ICD-10-CM

## 2023-08-06 DIAGNOSIS — M542 Cervicalgia: Secondary | ICD-10-CM

## 2023-08-06 MED ORDER — NURTEC 75 MG PO TBDP
75.0000 mg | ORAL_TABLET | Freq: Once | ORAL | 1 refills | Status: DC | PRN
Start: 2023-08-06 — End: 2023-08-21
  Filled 2023-08-06: qty 10, 30d supply, fill #0

## 2023-08-06 NOTE — Progress Notes (Signed)
Subjective:    Patient ID: Deanna [name] is a 57 y.o. female.  HPI    Huston Foley, MD, PhD [facility] [address], [address] [address] [place], [place] [address]  Dear Deanna [name],  I saw your patient, Deanna [name], upon your kind request in my neurologic clinic today for initial consultation of her recurrent headache.  The patient is unaccompanied today.  As you know, [name] is a 57 year old female with an underlying medical history of clotting disorder, restless leg syndrome, chronic neck pain, lumbar spinal stenosis, thyroid disease, vertigo, and overweight state, who reports recurrent headaches for months. She has intermittent headaches, that often start in the back of her head and radiate forward, sometimes she has right-sided headaches and eye pain, these headaches are often associated with visual disturbance, such as visual field defect.  She has not seen her ophthalmologist yet, she is a little overdue for her eye exam, saw her eye doctor about 2 years ago, she does not currently use any prescription eyeglasses.  Sometimes the headache is associated with nausea and throbbing pain and she has had associated chest pain which radiates to the shoulder.  She has not addressed her chest pain with your office yet.  She has not seen a cardiologist.  She has seen a chiropractor for chronic back pain but not a spine specialist.  Years ago she saw a spine specialist for her lower back and had received epidural steroid injections in the distant past.  She has a history of sciatica as well.  She has not fallen recently.  She bumped her head recently on a [place] countertop, no loss of consciousness.  She started having some right-sided headaches after that which have essentially improved.  She had some dizziness which has improved.  She also had an incidence of swelling in the right temporal area which was a soft tissue swelling which subsided after a few days.  She did not  have any pain in the temple at the time.  She does not take any migraine medication, she did not grow up having migraines.  She does not have a family history of migraines.  She does not sleep very well, is not opposed to pursuing a sleep study at this time.  She has nocturia about once or twice per average night.  She has woken up with a headache in the morning.  She has noticed a mild increase in her diastolic blood pressure whenever he gets checked which is not typical for her.  She denies any sudden onset one-sided weakness or numbness or tingling or droopy face or slurring of speech.   I reviewed your office note from 07/18/2023.  She reported intermittent visual disturbance and a head injury a few weeks prior.  She was sent to the emergency room for imaging.  She presented to the emergency room at med center in [facility] on 07/18/2023 and I reviewed the emergency room records.  She had a CT angiogram of the head and neck with and without contrast on 07/18/2023 and I reviewed the results:   IMPRESSION: 1. No acute intracranial abnormality. 2. No intracranial large vessel occlusion. 3. 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    She was advised to follow-up with ophthalmology and PCP.  She was treated symptomatically with Zofran ODT.    Her ESR on 07/18/2023 was unremarkable at 10.  I had evaluated her several years ago for her sleep disorder.  She did  not pursue sleep testing at the time.    In the past, she was followed in this clinic by Dr. Anne Hahn for low back pain and paresthesias as well as restless leg symptoms.  10/21/2016: 57 year old right-handed woman with an underlying medical history of restless leg syndrome, lumbar spinal stenosis with back pain for which she has been seeing my colleague Dr. Anne Hahn, intermittent vertigo, and obesity, who reports snoring, excessive daytime somnolence and morning headaches. Her Epworth sleepiness score is 13 out of 24 today, her  fatigue score is 53 out of 63. She does not wake up rested. I reviewed your office note from 09/18/2016. She has a Bedtime of around 11 PM, wakeup time is around 6. She does not wake up rested. She has occasional morning headaches which are dull, achy, non-migrainous. She has a brother with obstructive sleep apnea who uses a CPAP machine. Her mother had a sleep study and was told she has restless leg syndrome. Patient herself has restless leg symptoms and a tendency to move her feet before falling asleep has been told that she jerks her legs in sleep. She lives alone. She is divorced, she has 2 grown kids, both in college. She is a nonsmoker, drinks alcohol infrequently, about once a week and caffeine in the form of coffee, 2 cups per day and occasional sodas, typically no tea. She has been on Lexapro low-dose for the past year for situational anxiety which has helped. However, in the past year she has gained about 20 pounds. She denies any night sweats night nocturia. On weekends she tries to sleep in longer but does not necessarily wake up better rested. Her peak his sleep-related complaint is her nonrestorative sleep and daytime sleepiness. She works full-time. She has been on prescription strength vitamin D but otherwise does not take any other prescription medications. She has had epidural steroid injection for low back pain which have helped.    Her Past Medical History Is Significant For: Past Medical History:  Diagnosis Date   Bleeding disorder (HCC) 08/31/2013   Bleeding gums    Clotting disorder (HCC)    COVID-19 11/2019   Dysplastic nevus    followed yearly by [facility].   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HCC)    Restless legs syndrome (RLS) 05/18/2014   Spinal stenosis  of lumbar region 09/22/2015   Thyroid disease    Post-partum   Vertigo, intermittent 01/09/2015    Her Past Surgical History Is Significant For: Past Surgical History:  Procedure Laterality Date   BREAST BIOPSY Left    Stereotactic needle biopsy-benign   BREAST SURGERY Left 2007   removal of a severely dysplastic nevus vs. melanoma   NASAL FRACTURE SURGERY  1995   post-op hemorrhage   PARTIAL HYSTERECTOMY  11/04/2004   DUB/adenomyosis. Ovaries intact.  Arlyce Dice.   resection dysplastic nevus  2012    Her Family History Is Significant For: Family History  Problem Relation Age of Onset   Colon cancer Mother 24   Diabetes Mother    Hypertension Mother    Dementia Mother    Stroke Father 69   Thyroid disease Sister    Colon cancer Sister 61   COPD Sister    Heart disease Sister 35  AMI   Breast cancer Sister        dx early 44s   Hypertension Brother    Heart disease Brother    Heart disease Brother        multiple AMI   Esophageal cancer Maternal Uncle        dx 73s   Bone cancer Half-Brother        paternal; d. late 35s   Esophageal cancer Half-Brother        paternal; d. late 28s   Colon polyps Neg Hx    Stomach cancer Neg Hx    Rectal cancer Neg Hx    Migraines Neg Hx     Her Social History Is Significant For: Social History   Socioeconomic History   Marital status: Divorced    Spouse name: Not on file   Number of children: 2   Years of education: AS   Highest education level: Tax adviser degree: occupational, Scientist, product/process development, or vocational program  Occupational History    Employer: [employer]    Comment: Engineer, manufacturing for hospitalist  Tobacco Use   Smoking status: Never   Smokeless tobacco: Never  Vaping Use   Vaping status: Never Used  Substance and Sexual Activity   Alcohol use: Yes    Comment: 2 drinks per week    Drug use: No   Sexual activity: Yes    Birth control/protection: None  Other Topics Concern   Not on file  Social History Narrative    Marital status: married x 23 years; happily married; no abuse. Update 08/06/2023 has fiance'       Children: 2 children (19, 17); no grandchildren.      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      Tobacco:  None      Alcohol: none      Drugs: none      Exercise:  Walking, strength training daily.      Seatbelt: 100%      Guns: locked up        Sunscreen:  SPF 30.   Patient is right handed.   Patient drinks 2 cups caffeine daily.   Social Determinants of Health   Financial Resource Strain: Low Risk  (07/18/2023)   Overall Financial Resource Strain (CARDIA)    Difficulty of Paying Living Expenses: Not hard at all  Food Insecurity: No Food Insecurity (07/18/2023)   Hunger Vital Sign    Worried About Running Out of Food in the Last Year: Never true    Ran Out of Food in the Last Year: Never true  Transportation Needs: No Transportation Needs (07/18/2023)   PRAPARE - Administrator, Civil Service (Medical): No    Lack of Transportation (Non-Medical): No  Physical Activity: Insufficiently Active (07/18/2023)   Exercise Vital Sign    Days of Exercise per Week: 3 days    Minutes of Exercise per Session: 30 min  Stress: Stress Concern Present (07/18/2023)   [employer] of Occupational Health - Occupational Stress Questionnaire    Feeling of Stress : Rather much  Social Connections: Moderately Integrated (07/18/2023)   Social Connection and Isolation Panel [NHANES]    Frequency of Communication with Friends and Family: More than three times a week    Frequency of Social Gatherings with Friends and Family: Once a week    Attends Religious Services: More than 4 times per year    Active Member of [employer] or Organizations: Yes    Attends [employer]  or Organization Meetings: More than 4 times per year    Marital Status: Divorced    Her Allergies Are:  Allergies  Allergen Reactions   Aspirin Other (See Comments)    ASA contraindicated with bleeding disorder    Benay Spice [[employer]]   :   Her Current Medications Are:  Outpatient Encounter Medications as of 08/06/2023  Medication Sig   aminocaproic acid (AMICAR) 500 MG tablet TAKE 4 TABLETS BY MOUTH (2000 MG TOTAL) BY MOUTH 2 TIMES DAILY. TAKE FOR 1 WEEK FOR SURGICAL PROCEDURES.   Cholecalciferol (VITAMIN D3) 1000 units CAPS Take 5,000 mg by mouth daily.   estradiol (ESTRACE) 0.1 MG/GM vaginal cream Insert pea size amount into vagina every night for 2 weeks, then twice weekly for maintenance   nitrofurantoin, macrocrystal-monohydrate, (MACROBID) 100 MG capsule Take 1 capsule (100 mg total) by mouth as needed to prevent urinary tract infection after intercourse   ondansetron (ZOFRAN-ODT) 4 MG disintegrating tablet Dissolve 1 tablet (4 mg total) in mouth every 8 (eight) hours as needed for nausea or vomiting.   tranexamic acid (LYSTEDA) 650 MG TABS tablet Take by mouth.   No facility-administered encounter medications on file as of 08/06/2023.  :   Review of Systems:  Out of a complete 14 point review of systems, all are reviewed and negative with the exception of these symptoms as listed below:  Review of Systems  Neurological:        Patient is here alone for referral for headaches, vision changes after she hit her head on a counter. She has improved. She went to the ER for evaluation after seeing primary care. She states she was having sharp pains in the right frontoparietal area but they have pretty much resolved. She states she also discussed with primary care that it is not uncommon to have some distension of veins on the sides of her head some with headaches. One day she got out of the shower and had a nickel to quarter-sized bulbous swelling in the right temple which stayed through the night into the next morning. 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     Objective:  Neurological Exam  Physical Exam Physical Examination:    Vitals:   08/06/23 1426  BP: 135/87  Pulse: 68    General Examination: The patient is a very pleasant 57 y.o. female in no acute distress. She appears well-developed and well-nourished and well groomed.   HEENT: Normocephalic, atraumatic, pupils are equal, round and reactive to light, no significant photophobia, funduscopic exam benign.  Extraocular tracking is good without limitation to gaze excursion or nystagmus noted. Hearing is grossly intact. Face is symmetric with normal facial animation. Speech is clear with no dysarthria noted. There is no hypophonia. There is no lip, neck/head, jaw or voice tremor. Neck is supple with full range of passive and active motion. There are no carotid bruits on auscultation. Oropharynx exam reveals: mild mouth dryness, adequate dental hygiene and mild airway crowding noted.  Tongue protrudes centrally and palate elevates symmetrically.    Chest: Clear to auscultation without wheezing, rhonchi or crackles noted.  Heart: S1+S2+0, regular and normal without murmurs, rubs or gallops noted.   Abdomen: Soft, non-tender and non-distended.  Extremities: There is no pitting edema in the distal lower extremities bilaterally.   Skin: Warm and dry without trophic changes noted.   Musculoskeletal: exam reveals no obvious joint deformities.   Neurologically:  Mental status: The patient  is awake, alert and oriented in all 4 spheres. Her immediate and remote memory, attention, language skills and fund of knowledge are appropriate. There is no evidence of aphasia, agnosia, apraxia or anomia. Speech is clear with normal prosody and enunciation. Thought process is linear. Mood is normal and affect is normal.  Cranial nerves II - XII are as described above under HEENT exam.  Motor exam: Normal bulk, strength and tone is noted. There is no obvious action or resting tremor.  Fine motor skills and coordination: Intact finger taps, hand movements and rapid alternating  patting in both upper extremities, normal foot taps and foot agility bilaterally.    Cerebellar testing: No dysmetria or intention tremor. There is no truncal or gait ataxia.  Normal finger-to-nose, normal heel-to-shin bilaterally. Reflexes 1+ throughout, toes are downgoing bilaterally. Sensory exam: intact to light touch in the upper and lower extremities.  Gait, station and balance: She stands easily. No veering to one side is noted. No leaning to one side is noted. Posture is age-appropriate and stance is narrow based. Gait shows normal stride length and normal pace. No problems turning are noted.  Normal tandem walk. Romberg negative.  Assessment and Plan:  In summary, Zunaira H Garmany is a very pleasant 57 y.o.-year old female with an underlying medical history of clotting disorder, restless leg syndrome, chronic neck pain, lumbar spinal stenosis, thyroid disease, vertigo, and overweight state, who presents for evaluation of her recurrent headaches of several months duration.  She likely has a combination headache including headaches secondary to neck pain or cervicogenic headaches, migraine headaches with visual aura described, stress related or tension headaches, underlying obstructive sleep apnea not excluded and also a possible contributor to headaches, strain on the eyes also possible.  We had a long disc today.  Neurological exam is nonfocal.  Recent ER visit was reviewed and CT angiogram results were reviewed.  She is advised to follow-up with your office and discuss her neck pain for the possibility of a referral to a spine specialist and discuss her chest pain for the possibility of a referral to cardiology.  She is advised to be cautious with neck manipulation through a chiropractor.  She did not notice any improvement with chiropractic manipulation.  She reports that she had loss of neck curvature.  She is advised to consider physical therapy, this can be done through orthopedics or your  office.  She is advised to stay well-hydrated and well rested.   These are the recommendations and talking points today, patient was also given these instructions in writing in her MyChart after visit summary.   Your neuro exam is benign.  Your headache is very possibly a combination of headache including migraine headaches, neck related headaches, sleep apnea related headaches also a possibility.  We will proceed with a home sleep test to evaluate you for obstructive sleep apnea.  If you have obstructive sleep apnea I will likely recommend treatment with a CPAP or AutoPap machine.  Strain on the eyes can also cause headaches as you know. We will do a brain scan, called MRI and call you with the test results. We will have to schedule you for this on a separate date. This test requires authorization from your insurance, and we will take care of the insurance process. Please remember, common headache triggers are: sleep deprivation, dehydration, overheating, stress, hypoglycemia or skipping meals and blood sugar fluctuations, excessive pain medications or excessive alcohol use or caffeine withdrawal. Some people have food triggers such  as aged cheese, orange juice or chocolate, especially dark chocolate, or MSG (monosodium glutamate). Try to avoid these headache triggers as much possible. It may be helpful to keep a headache diary to figure out what makes your headaches worse or brings them on and what alleviates them. Some people report headache onset after exercise but studies have shown that regular exercise may actually prevent headaches from coming. If you have exercise-induced headaches, please make sure that you drink plenty of fluid before and after exercising and that you do not over do it and do not overheat.  For migraine acute treatment: We will try you on Nurtec 75 mg strength:  Take 1 pill at onset of migraine headache, no more than 1 pill in 24 hours. May cause sedation and nausea.  Please  schedule a full, dilated eye exam with your ophthalmologist.  Strain on the eyes can also cause headaches. Talk to your PCP about your chest pain. They may want you to see a cardiologist.  Also talk to your PCP about your neck pain, they may recommend that you see a spine specialist.      We will plan a follow-up after testing.  We will keep her posted as to her test results by phone call.    Thank you very much for allowing me to participate in the care of this nice patient. If I can be of any further assistance to you please do not hesitate to call me at [phone]. This was an extended visit of over 1 hour with significant record review involved and addressing multiple problems. Sincerely,   Huston Foley, MD, PhD

## 2023-08-06 NOTE — Progress Notes (Signed)
Appt canceled!

## 2023-08-06 NOTE — Patient Instructions (Addendum)
It was nice to meet you today.   Here is what we discussed today:   Your neuro exam is benign.  Your headache is very possibly a combination of headache including migraine headaches, neck related headaches, sleep apnea related headaches also a possibility.  We will proceed with a home sleep test to evaluate you for obstructive sleep apnea.  If you have obstructive sleep apnea I will likely recommend treatment with a CPAP or AutoPap machine.  Strain on the eyes can also cause headaches as you know. We will do a brain scan, called MRI and call you with the test results. We will have to schedule you for this on a separate date. This test requires authorization from your insurance, and we will take care of the insurance process. Please remember, common headache triggers are: sleep deprivation, dehydration, overheating, stress, hypoglycemia or skipping meals and blood sugar fluctuations, excessive pain medications or excessive alcohol use or caffeine withdrawal. Some people have food triggers such as aged cheese, orange juice or chocolate, especially dark chocolate, or MSG (monosodium glutamate). Try to avoid these headache triggers as much possible. It may be helpful to keep a headache diary to figure out what makes your headaches worse or brings them on and what alleviates them. Some people report headache onset after exercise but studies have shown that regular exercise may actually prevent headaches from coming. If you have exercise-induced headaches, please make sure that you drink plenty of fluid before and after exercising and that you do not over do it and do not overheat.  For migraine acute treatment: We will try you on Nurtec 75 mg strength: Take 1 pill at onset of migraine headache, no more than 1 pill in 24 hours. May cause sedation and nausea.  Please schedule a full, dilated eye exam with your ophthalmologist.  Strain on the eyes can also cause headaches. Talk to your PCP about your chest pain.  They may want you to see a cardiologist.  Also talk to your PCP about your neck pain, they may recommend that you see a spine specialist.

## 2023-08-07 ENCOUNTER — Telehealth: Payer: Self-pay | Admitting: Neurology

## 2023-08-07 DIAGNOSIS — M47814 Spondylosis without myelopathy or radiculopathy, thoracic region: Secondary | ICD-10-CM | POA: Diagnosis not present

## 2023-08-07 DIAGNOSIS — M9902 Segmental and somatic dysfunction of thoracic region: Secondary | ICD-10-CM | POA: Diagnosis not present

## 2023-08-07 DIAGNOSIS — M9901 Segmental and somatic dysfunction of cervical region: Secondary | ICD-10-CM | POA: Diagnosis not present

## 2023-08-07 DIAGNOSIS — M6240 Contracture of muscle, unspecified site: Secondary | ICD-10-CM | POA: Diagnosis not present

## 2023-08-07 NOTE — Telephone Encounter (Signed)
Called patient to schedule her MRI at [facility] but her VM was full.

## 2023-08-12 ENCOUNTER — Telehealth: Payer: Self-pay | Admitting: Neurology

## 2023-08-12 DIAGNOSIS — M9901 Segmental and somatic dysfunction of cervical region: Secondary | ICD-10-CM | POA: Diagnosis not present

## 2023-08-12 DIAGNOSIS — M9902 Segmental and somatic dysfunction of thoracic region: Secondary | ICD-10-CM | POA: Diagnosis not present

## 2023-08-12 DIAGNOSIS — M47814 Spondylosis without myelopathy or radiculopathy, thoracic region: Secondary | ICD-10-CM | POA: Diagnosis not present

## 2023-08-12 DIAGNOSIS — M6240 Contracture of muscle, unspecified site: Secondary | ICD-10-CM | POA: Diagnosis not present

## 2023-08-12 NOTE — Telephone Encounter (Signed)
Dr Ernestina Patches from [facility] is asking for a call to discuss the results to pt's CT scan.  She is asking to be called on tomorrow around either 8:00. 12 noon or around 3pm. Office # [phone]

## 2023-08-12 NOTE — Telephone Encounter (Signed)
Please call patient's chiropractic office to get more information,are they asking about the results of an existing CT scan or is this a new CT scan they did?  I do not see any new imaging records in the chart.  I have ordered a brain MRI.  I recommended that patient see a spine specialist for her neck pain.  She was to discuss this with PCP.  See patient instructions for reference in her AVS as well.

## 2023-08-13 NOTE — Telephone Encounter (Signed)
I spoke with Dr Ernestina Patches at [facility]. She states the patient is concerned about the moderate vertebral artery stenosis seen on CT angio. She has spoken to the patient since the OV with Dr Frances Furbish. Pt believed Dr Frances Furbish was aware but did not know further thoughts/recommendations. I did mention Dr Frances Furbish ordered MRI brain and recommended patient see spine specialist for her neck pain. Dr Ernestina Patches is asking is there further testing that needs to be done? Dr Ernestina Patches states she is not doing any manual manipulation of neck but is asking if she is ok to proceed with cervical distraction and acupuncture. She states the patient has reported dizziness, headaches and her BP is slightly elevated in 120s and 130s compared to typical mid 110s/60.

## 2023-08-13 NOTE — Telephone Encounter (Signed)
I called Dr Greggory Keen office back and left message with Angie for Dr Ernestina Patches to call us when able to tomorrow. We may receive a call as early as 8 am.

## 2023-08-13 NOTE — Telephone Encounter (Signed)
I had an extensive discussion already with the patient.  Please see my after visit instructions for reference as well again.  I am okay with acupuncture but I am not familiar with cervical distraction technique.  I typically do not recommend cervical manipulation.  I recommend that she talk to her primary care about a referral to spine specialist.  I also recommend that she talk to primary care about dizziness and blood pressure elevation.  Essentially no new recommendations.

## 2023-08-14 DIAGNOSIS — M9901 Segmental and somatic dysfunction of cervical region: Secondary | ICD-10-CM | POA: Diagnosis not present

## 2023-08-14 DIAGNOSIS — M6240 Contracture of muscle, unspecified site: Secondary | ICD-10-CM | POA: Diagnosis not present

## 2023-08-14 DIAGNOSIS — M47814 Spondylosis without myelopathy or radiculopathy, thoracic region: Secondary | ICD-10-CM | POA: Diagnosis not present

## 2023-08-14 DIAGNOSIS — M9902 Segmental and somatic dysfunction of thoracic region: Secondary | ICD-10-CM | POA: Diagnosis not present

## 2023-08-14 NOTE — Telephone Encounter (Signed)
Currently no further w/u or recommendations, other than what is already planned and what was discussed during visit. See visit note for details.

## 2023-08-14 NOTE — Telephone Encounter (Signed)
Spoke to Dr. Ernestina Patches, and relayed the message per Dr. Frances Furbish.  She had qeustion to know what was the # one thing she would recommend doing, relating to moderate vertebral stenosis (what specifically what trying to rule out.

## 2023-08-14 NOTE — Telephone Encounter (Signed)
I called and spoke to Dr. Ernestina Patches,  I relayed Dr. Teofilo Pod note.  She asked of cardiology referral was made , I did not see one.  She will relay to pt  she appreciated call back.

## 2023-08-21 ENCOUNTER — Other Ambulatory Visit: Payer: Self-pay | Admitting: *Deleted

## 2023-08-21 ENCOUNTER — Telehealth: Payer: Self-pay

## 2023-08-21 ENCOUNTER — Other Ambulatory Visit (HOSPITAL_COMMUNITY): Payer: Self-pay

## 2023-08-21 MED ORDER — RIZATRIPTAN BENZOATE 5 MG PO TBDP
5.0000 mg | ORAL_TABLET | ORAL | 0 refills | Status: DC | PRN
  Filled 2023-08-21: qty 10, 30d supply, fill #0

## 2023-08-21 NOTE — Telephone Encounter (Signed)
I don't see a contraindication to trying a triptan at this time.  She can try Maxalt orally disintegrating tab, 5 mg: take 1 pill early on when you suspect a migraine attack come on.  She can take another pill after 2 hours, no more than 2 pills in 24 hours, no more than 3 pills a week.  Please advise her of the following: Most people who take triptans do not have any serious side-effects. However, they can cause drowsiness (remember to not drive or use heavy machinery when drowsy), nausea, dizziness, dry mouth. Less common side effects include strange sensations, such as tightness in your chest or throat, tingling, flushing, and feelings of heaviness or pressure in areas such as the face, limbs, and chest. These in the chest can mimic heart related pain (angina) and may cause alarm, but usually these sensations are not harmful or a sign of a heart attack. However, if you develop intense chest pain or sensations of discomfort, you should stop taking your medication and consult with me or your PCP or go to the nearest urgent care facility or ER or call 911.  Rx sent to pharm.

## 2023-08-21 NOTE — Telephone Encounter (Signed)
I called the pt and LVM (ok per DPR) about the maxalt being prescribed due to insurance requirements for Nurtec. I went over side effects and instructions. I also sent the patient a mychart message.

## 2023-08-21 NOTE — Telephone Encounter (Signed)
*  [facility]  Prior Authorization form/request asks a question that requires your assistance. Please see the question below and advise accordingly.   Has the patient had a trial of or contraindication to ONE triptan (e.g., Imitrex [sumatriptan], Maxalt [rizatriptan])  CMM Key: [id number]

## 2023-08-22 DIAGNOSIS — M9901 Segmental and somatic dysfunction of cervical region: Secondary | ICD-10-CM | POA: Diagnosis not present

## 2023-08-22 DIAGNOSIS — M9902 Segmental and somatic dysfunction of thoracic region: Secondary | ICD-10-CM | POA: Diagnosis not present

## 2023-08-22 DIAGNOSIS — M47814 Spondylosis without myelopathy or radiculopathy, thoracic region: Secondary | ICD-10-CM | POA: Diagnosis not present

## 2023-08-22 DIAGNOSIS — M6240 Contracture of muscle, unspecified site: Secondary | ICD-10-CM | POA: Diagnosis not present

## 2023-08-26 NOTE — Patient Instructions (Signed)
It was great to see you again today, I will be in touch with your labs as soon as possible Recommend flu shot, COVID booster this fall if not done already

## 2023-08-26 NOTE — Progress Notes (Unsigned)
[facility] [address] [place], [place] [address] [phone]  Date:  08/27/2023   Name:  Deanna [name]   DOB:  September 08, 1966   MRN:  [id number]  PCP:  Pearline Cables, MD    Chief Complaint: No chief complaint on file.   History of Present Illness:  Deanna [name] is a 57 y.o. very pleasant female patient who presents with the following:  Patient seen today for physical exam Most recent visit with myself was in July of last year  History of platelet disorder, uses DDAVP nasal spray as needed.  She has seen [facility] hematology in the past, has not needed frequent follow-up.   She is an Production designer, theatre/television/film for [employer]  She generally gets plenty of exercise, never smoker We discussed doing a CT coronary calcium last year, but I do not think this got done  Blood work on chart from September, CMP and CBC.  Otherwise labs were done a year ago  Cervical cancer screening Colon cancer screening-colonoscopy 2021 Mammogram completed last month She is vaccinated against varicella, does not need Shingrix Flu vaccine COVID booster Tetanus up-to-date  Vaginal estrogen cream Nitrofurantoin as needed Maxalt as needed for headache  She was seen last month in the ER with blurred vision, was later seen by neurology, Dr. Frances Furbish saw her on 10/2: In summary, Deanna [name] is a very pleasant 57 y.o.-year old female with an underlying medical history of clotting disorder, restless leg syndrome, chronic neck pain, lumbar spinal stenosis, thyroid disease, vertigo, and overweight state, who presents for evaluation of her recurrent headaches of several months duration.  She likely has a combination headache including headaches secondary to neck pain or cervicogenic headaches, migraine headaches with visual aura described, stress related or tension headaches, underlying obstructive sleep apnea not excluded and also a  possible contributor to headaches, strain on the eyes also possible.  We had a long disc today.  Neurological exam is nonfocal.  Recent ER visit was reviewed and CT angiogram results were reviewed.  She is advised to follow-up with your office and discuss her neck pain for the possibility of a referral to a spine specialist and discuss her chest pain for the possibility of a referral to cardiology.  She is advised to be cautious with neck manipulation through a chiropractor.  She did not notice any improvement with chiropractic manipulation.  She reports that she had loss of neck curvature.  She is advised to consider physical therapy, this can be done through orthopedics or your office.  She is advised to stay well-hydrated and well rested.    Patient Active Problem List   Diagnosis Date Noted   Genetic testing 06/20/2020   Preventive measure 06/13/2020   Family history of breast cancer    Family history of colon cancer    Spinal stenosis of lumbar region 09/22/2015   Vertigo, intermittent 01/09/2015   Hx of adenomatous polyp of colon 09/21/2014   Restless legs syndrome (RLS) 05/18/2014   Platelet dysfunction (HCC) 05/16/2014   Bleeding disorder (HCC) 08/31/2013   Paresthesias 07/19/2013   Hematuria 07/19/2013   Family history of colon cancer requiring screening colonoscopy 04/12/2013   Bleeding gums 04/12/2013   Vitamin D deficiency 04/12/2013   Palpitations 03/09/2013   Bleeding 03/09/2013   Thyroid disease    Dysplastic nevus     Past Medical History:  Diagnosis Date   Bleeding disorder (HCC) 08/31/2013  Bleeding gums    Clotting disorder (HCC)    COVID-19 11/2019   Dysplastic nevus    followed yearly by [facility].   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HCC)     Restless legs syndrome (RLS) 05/18/2014   Spinal stenosis of lumbar region 09/22/2015   Thyroid disease    Post-partum   Vertigo, intermittent 01/09/2015    Past Surgical History:  Procedure Laterality Date   BREAST BIOPSY Left    Stereotactic needle biopsy-benign   BREAST SURGERY Left 2007   removal of a severely dysplastic nevus vs. melanoma   NASAL FRACTURE SURGERY  1995   post-op hemorrhage   PARTIAL HYSTERECTOMY  11/04/2004   DUB/adenomyosis. Ovaries intact.  Arlyce Dice.   resection dysplastic nevus  2012    Social History   Tobacco Use   Smoking status: Never   Smokeless tobacco: Never  Vaping Use   Vaping status: Never Used  Substance Use Topics   Alcohol use: Yes    Comment: 2 drinks per week    Drug use: No    Family History  Problem Relation Age of Onset   Colon cancer Mother 60   Diabetes Mother    Hypertension Mother    Dementia Mother    Stroke Father 47   Thyroid disease Sister    Colon cancer Sister 71   COPD Sister    Heart disease Sister 4       AMI   Breast cancer Sister        dx early 80s   Hypertension Brother    Heart disease Brother    Heart disease Brother        multiple AMI   Esophageal cancer Maternal Uncle        dx 40s   Bone cancer Half-Brother        paternal; d. late 73s   Esophageal cancer Half-Brother        paternal; d. late 46s   Colon polyps Neg Hx    Stomach cancer Neg Hx    Rectal cancer Neg Hx    Migraines Neg Hx     Allergies  Allergen Reactions   Aspirin Other (See Comments)    ASA contraindicated with bleeding disorder   Xiidra [Lifitegrast]     Medication list has been reviewed and updated.  Current Outpatient Medications on File Prior to Visit  Medication Sig Dispense Refill   aminocaproic acid (AMICAR) 500 MG tablet TAKE 4 TABLETS BY MOUTH (2000 MG TOTAL) BY MOUTH 2 TIMES DAILY. TAKE FOR 1 WEEK FOR SURGICAL PROCEDURES.     Cholecalciferol (VITAMIN D3) 1000 units CAPS Take 5,000 mg by mouth daily.      estradiol (ESTRACE) 0.1 MG/GM vaginal cream Insert pea size amount into vagina every night for 2 weeks, then twice weekly for maintenance 42.5 g 4   nitrofurantoin, macrocrystal-monohydrate, (MACROBID) 100 MG capsule Take 1 capsule (100 mg total) by mouth as needed to prevent urinary tract infection after intercourse 60 capsule 2   ondansetron (ZOFRAN-ODT) 4 MG disintegrating tablet Dissolve 1 tablet (4 mg total) in mouth every 8 (eight) hours as needed for nausea or vomiting. 20 tablet 0   rizatriptan (MAXALT-MLT) 5 MG disintegrating tablet Take 1 tablet (5  mg total) by mouth as needed for migraine. May repeat in 2 hours if needed. No more than 2 pills/24 hours, no more than 3 pills/week. 10 tablet 0   tranexamic acid (LYSTEDA) 650 MG TABS tablet Take by mouth.     No current facility-administered medications on file prior to visit.    Review of Systems:  As per HPI- otherwise negative.   Physical Examination: There were no vitals filed for this visit. There were no vitals filed for this visit. There is no height or weight on file to calculate BMI. Ideal Body Weight:    GEN: no acute distress. HEENT: Atraumatic, Normocephalic.  Ears and Nose: No external deformity. CV: RRR, No M/G/R. No JVD. No thrill. No extra heart sounds. PULM: CTA B, no wheezes, crackles, rhonchi. No retractions. No resp. distress. No accessory muscle use. ABD: S, NT, ND, +BS. No rebound. No HSM. EXTR: No c/c/e PSYCH: Normally interactive. Conversant.    Assessment and Plan: *** Physical exam today.  Encouraged healthy diet and exercise routine Will plan further follow- up pending labs.  Signed Abbe Amsterdam, MD

## 2023-08-27 ENCOUNTER — Other Ambulatory Visit (HOSPITAL_COMMUNITY): Payer: Self-pay

## 2023-08-27 ENCOUNTER — Encounter: Payer: Self-pay | Admitting: Family Medicine

## 2023-08-27 ENCOUNTER — Ambulatory Visit: Payer: 59 | Admitting: Family Medicine

## 2023-08-27 VITALS — BP 122/70 | HR 63 | Temp 97.8°F | Resp 18 | Ht 65.0 in | Wt 175.2 lb

## 2023-08-27 DIAGNOSIS — F4322 Adjustment disorder with anxiety: Secondary | ICD-10-CM

## 2023-08-27 DIAGNOSIS — Z0000 Encounter for general adult medical examination without abnormal findings: Secondary | ICD-10-CM | POA: Diagnosis not present

## 2023-08-27 DIAGNOSIS — D691 Qualitative platelet defects: Secondary | ICD-10-CM

## 2023-08-27 DIAGNOSIS — Z131 Encounter for screening for diabetes mellitus: Secondary | ICD-10-CM | POA: Diagnosis not present

## 2023-08-27 DIAGNOSIS — Z1329 Encounter for screening for other suspected endocrine disorder: Secondary | ICD-10-CM

## 2023-08-27 DIAGNOSIS — R5383 Other fatigue: Secondary | ICD-10-CM | POA: Diagnosis not present

## 2023-08-27 DIAGNOSIS — Z13220 Encounter for screening for lipoid disorders: Secondary | ICD-10-CM | POA: Diagnosis not present

## 2023-08-27 DIAGNOSIS — E559 Vitamin D deficiency, unspecified: Secondary | ICD-10-CM

## 2023-08-27 DIAGNOSIS — R0789 Other chest pain: Secondary | ICD-10-CM | POA: Diagnosis not present

## 2023-08-27 LAB — LIPID PANEL
Cholesterol: 246 mg/dL — ABNORMAL HIGH (ref 0–200)
HDL: 83.5 mg/dL (ref >39.00)
LDL Cholesterol: 144 mg/dL — ABNORMAL HIGH (ref 0–99)
NonHDL: 162.01
Total CHOL/HDL Ratio: 3
Triglycerides: 89 mg/dL (ref 0.0–149.0)
VLDL: 17.8 mg/dL (ref 0.0–40.0)

## 2023-08-27 LAB — VITAMIN D 25 HYDROXY (VIT D DEFICIENCY, FRACTURES): VITD: 37.66 ng/mL (ref 30.00–100.00)

## 2023-08-27 LAB — TSH: TSH: 1.96 u[IU]/mL (ref 0.35–5.50)

## 2023-08-27 LAB — HEMOGLOBIN A1C: Hgb A1c MFr Bld: 5.4 % (ref 4.6–6.5)

## 2023-08-27 LAB — VITAMIN B12: Vitamin B-12: 613 pg/mL (ref 211–911)

## 2023-08-27 MED ORDER — FLUOXETINE HCL 20 MG PO CAPS
20.0000 mg | ORAL_CAPSULE | Freq: Every day | ORAL | 3 refills | Status: DC
Start: 2023-08-27 — End: 2023-12-24
  Filled 2023-08-27: qty 90, 90d supply, fill #0

## 2023-08-28 ENCOUNTER — Encounter: Payer: Self-pay | Admitting: Family Medicine

## 2023-09-02 ENCOUNTER — Other Ambulatory Visit (HOSPITAL_COMMUNITY): Payer: Self-pay

## 2023-09-05 DIAGNOSIS — M9901 Segmental and somatic dysfunction of cervical region: Secondary | ICD-10-CM | POA: Diagnosis not present

## 2023-09-05 DIAGNOSIS — M9902 Segmental and somatic dysfunction of thoracic region: Secondary | ICD-10-CM | POA: Diagnosis not present

## 2023-09-05 DIAGNOSIS — M47814 Spondylosis without myelopathy or radiculopathy, thoracic region: Secondary | ICD-10-CM | POA: Diagnosis not present

## 2023-09-05 DIAGNOSIS — M6240 Contracture of muscle, unspecified site: Secondary | ICD-10-CM | POA: Diagnosis not present

## 2023-09-09 ENCOUNTER — Telehealth (HOSPITAL_COMMUNITY): Payer: Self-pay | Admitting: *Deleted

## 2023-09-09 NOTE — Telephone Encounter (Signed)
Left message on VM with reminder for upcoming stress test.  Deanna [name]

## 2023-09-10 ENCOUNTER — Other Ambulatory Visit (HOSPITAL_COMMUNITY): Payer: Self-pay

## 2023-09-17 ENCOUNTER — Encounter: Payer: Self-pay | Admitting: Family Medicine

## 2023-09-17 ENCOUNTER — Ambulatory Visit (HOSPITAL_COMMUNITY): Payer: 59 | Attending: Family Medicine

## 2023-09-17 DIAGNOSIS — R0789 Other chest pain: Secondary | ICD-10-CM | POA: Diagnosis not present

## 2023-09-17 LAB — EXERCISE TOLERANCE TEST
Angina Index: 1
Estimated workload: 11.7
Exercise duration (min): 10 min
Exercise duration (sec): 2 s
MPHR: 165 {beats}/min
Peak HR: 176 {beats}/min
Percent HR: 106 %
Rest HR: 78 {beats}/min

## 2023-10-26 ENCOUNTER — Encounter: Payer: Self-pay | Admitting: Family Medicine

## 2023-10-26 DIAGNOSIS — Z299 Encounter for prophylactic measures, unspecified: Secondary | ICD-10-CM

## 2023-11-06 ENCOUNTER — Telehealth (HOSPITAL_BASED_OUTPATIENT_CLINIC_OR_DEPARTMENT_OTHER): Payer: Self-pay | Admitting: Family Medicine

## 2023-11-12 ENCOUNTER — Ambulatory Visit (HOSPITAL_BASED_OUTPATIENT_CLINIC_OR_DEPARTMENT_OTHER)
Admission: RE | Admit: 2023-11-12 | Discharge: 2023-11-12 | Disposition: A | Discharge status: Home / Self Care | Payer: Self-pay | Source: Ambulatory Visit | Attending: Family Medicine | Admitting: Family Medicine

## 2023-11-12 DIAGNOSIS — Z299 Encounter for prophylactic measures, unspecified: Secondary | ICD-10-CM | POA: Insufficient documentation

## 2023-11-17 ENCOUNTER — Encounter: Payer: Self-pay | Admitting: Family Medicine

## 2023-11-17 DIAGNOSIS — R931 Abnormal findings on diagnostic imaging of heart and coronary circulation: Secondary | ICD-10-CM

## 2023-11-18 ENCOUNTER — Other Ambulatory Visit (HOSPITAL_COMMUNITY): Payer: Self-pay

## 2023-11-18 MED ORDER — ROSUVASTATIN CALCIUM 20 MG PO TABS
20.0000 mg | ORAL_TABLET | Freq: Every day | ORAL | 3 refills | Status: DC
Start: 2023-11-18 — End: 2023-12-24
  Filled 2023-11-18: qty 90, 90d supply, fill #0

## 2023-11-23 ENCOUNTER — Encounter: Payer: Self-pay | Admitting: Family Medicine

## 2023-12-07 ENCOUNTER — Telehealth: Payer: 59 | Admitting: Nurse Practitioner

## 2023-12-07 DIAGNOSIS — J329 Chronic sinusitis, unspecified: Secondary | ICD-10-CM

## 2023-12-07 DIAGNOSIS — B9689 Other specified bacterial agents as the cause of diseases classified elsewhere: Secondary | ICD-10-CM | POA: Diagnosis not present

## 2023-12-07 MED ORDER — AMOXICILLIN-POT CLAVULANATE 875-125 MG PO TABS
1.0000 | ORAL_TABLET | Freq: Two times a day (BID) | ORAL | 0 refills | Status: AC
Start: 2023-12-07 — End: 2023-12-14

## 2023-12-07 NOTE — Progress Notes (Signed)

## 2023-12-07 NOTE — Progress Notes (Signed)
 I have spent 5 minutes in review of e-visit questionnaire, review and updating patient chart, medical decision making and response to patient.   Claiborne Rigg, NP

## 2023-12-20 NOTE — Progress Notes (Unsigned)
 Cardiology Office Note:  .   Date:  12/24/2023 ID:  Deanna [name], DOB [birth year], MRN [id number] PCP: Pearline Cables, MD [facility] Providers Cardiologist:  None   Patient Profile: .      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   Deanna [name] is a very pleasant 58 y.o. female  who is here today for new patient consult for elevated coronary calcium on CT. the CT was ordered for risk evaluation particularly due to significant decrease in energy levels over the past 4 to 5 months. She reports she wakes up feeling pretty well rested but in the afternoon around 3:00., she needs a nap. Was working out 5 days a week 6 months ago, but now has little to no energy to work out. And despite healthy diet, feels she has gained about 15 lbs in the last year. There has been no significant change in her history to explain these changes, however she is now post-menopausal. She reports episodes of high blood pressure, which she describes as "inexplicably high" at times, accompanied by headaches with no obvious cause, while other times it is well controlled at 115-120 mmHg systolic.  She reports occasional chest pressure and pain, which she has previously attributed to stress; reports her work environment is stressful. The pain is located under her left breast and radiates to her shoulder blade area. She had a normal exercise stress test in November. She has a history of a platelet disorder, which causes prolonged bleed times and an inflammatory process. She has had significant bleeding episodes in the past, requiring hospitalization and blood transfusions. She also reports easy bruising and inflammation, particularly in her gums. Despite this disorder, her  platelet counts have always been normal on CBC. She has a family history of cancer, with her mother having had colon cancer and a sister recently diagnosed with breast cancer. She herself has undergone genetic testing, which did not indicate an abnormally high risk for a specific type of cancer. She denies shortness of breath, palpitations, orthopnea, PND, presyncope or syncope.   Family history: Her family history includes Bone cancer in her half-brother; Breast cancer in her sister; COPD in her sister; Colon cancer (age of onset: 19) in her sister; Colon cancer (age of onset: 19) in her mother; Dementia in her mother; Diabetes in her mother; Esophageal cancer in her half-brother and maternal uncle; Heart disease in her brother and brother; Heart disease (age of onset: 56) in her sister; Hypertension in her brother and mother; Stroke (age of onset: 46) in her father; Thyroid disease in her sister.  Hypertension and angina in her mother Father lived to be age 3, started having stroke in his late 55s  Discussed the use of AI scribe software for clinical note transcription with the patient, who gave verbal consent to proceed.  ASCVD Risk Score: The 10-year ASCVD risk score (Arnett DK, et al., 2019) is: 1.6%   Values used to calculate the score:     Age: 58 years     Sex: Female     Is Non-Hispanic African American: No     Diabetic: No     Tobacco smoker: No     Systolic Blood Pressure: 126 mmHg  Is BP treated: No     HDL Cholesterol: 83.5 mg/dL     Total Cholesterol: 246 mg/dL    Diet: Non-fat greek yogurt with fruit Overnight oats Peanut butter powder with pretzels or greek yogurt (grazes at lunch), protein bar or salad Dinner often incorporates salad, lean protein, occasional beef Avoids fried foods  Coke zero once weekly Seldom Etoh   Activity: Less energy over the past 4 months - no regular exercise over past 4-5 months Typically works out 5 days/week - 30 min to 1 hour Mixed  cardio/strength training, occasional HIIT Additional walking 30-45 min when weather is nice  ROS: See HPI       Studies Reviewed: Marland Kitchen   EKG Interpretation Date/Time:  Wednesday December 24 2023 10:13:00 EST Ventricular Rate:  66 PR Interval:  152 QRS Duration:  74 QT Interval:  398 QTC Calculation: 417 R Axis:   49  Text Interpretation: Normal sinus rhythm Normal ECG No previous ECGs available No ST Abnormality Confirmed by Eligha Bridegroom [phone]) on 12/24/2023 10:24:12 AM      Risk Assessment/Calculations:             Physical Exam:   VS: BP 126/80   Pulse 66   Ht 5\' 5"  (1.651 m)   Wt 185 lb 6.4 oz (84.1 kg)   SpO2 98%   BMI 30.85 kg/m   Wt Readings from Last 3 Encounters:  12/24/23 185 lb 6.4 oz (84.1 kg)  08/27/23 175 lb 3.2 oz (79.5 kg)  08/06/23 179 lb (81.2 kg)     GEN: Well nourished, well developed in no acute distress NECK: No JVD; No carotid bruits CARDIAC: RRR, no murmurs, rubs, gallops RESPIRATORY:  Clear to auscultation without rales, wheezing or rhonchi  ABDOMEN: Soft, non-tender, non-distended EXTREMITIES:  No edema; No deformity     ASSESSMENT AND PLAN: .    CAD: CT calcium score 11/12/2023 with CAC score 334 (99th percentile) with the bulk of calcium being in the LAD (207) and RCA (113).  She is having occasional episodes of left sided chest pain that radiates to her back. She is also concerned about fatigue which has limited her ability to exercise for the past 4-5 months. She was previously very active. No dyspnea, palpitations, diaphoresis, or n/v. EKG today reveals normal sinus rhythm, no ST abnormality.  Due to concerning symptoms and additional risk factors of hyperlipidemia and hypertension, we will get coronary CTA for ischemia evaluation.  Will have her take Lopressor 100 mg 2 hours prior to test.  LDL goal is 70 or lower.  As noted below, we will determine if statin therapy is contraindicated secondary to her platelet condition.  Continue heart  healthy diet avoiding saturated fat, sugar, simple carbohydrates, and processed foods.  Resume walking for exercise and report concerning symptoms. We will not start asa due to platelet disorder.   Hyperlipidemia LDL goal < 70: Lipid panel completed 08/27/2023 with total cholesterol 246, triglycerides 89, HDL 83.5, and LDL 144.  She was prescribed rosuvastatin, but has a concern about its interference with her platelet disorder. We will review with Pharm.D prior to recommendation to start statin therapy. Alternatively, if statin therapy is contraindicated, consider novel lipid lowering therapy. Will plan to get LP(a) testing with next lipid panel.   Qualitative platelet disorder: Diagnosed at [facility] several years ago after years of significant bleeding post-procedures that required blood transfusion. Interestingly, CBC has always revealed normal platelet count.  We are checking to see if statin therapy  is contraindicated with this condition.  No current bleeding concerns.   CV Risk Assessment: ASCVD risk score is low at 1.6%, however due to high calcium score, would recommend moderate intensity statin. As noted above, we are seeking guidance from pharmacist regarding contraindications 2/2 to her platelet disorder.  She has additional risk factor of hypertension.  She does not smoke and does not have diabetes.  Family history is not significant for early CAD.  Plan/Goals: Resume walking for exercise until coronary CTA is completed. Notify us if symptoms worsen. Continue heart healthy diet.        Disposition: 2-3 months with me  Signed, Eligha Bridegroom, NP-C

## 2023-12-24 ENCOUNTER — Other Ambulatory Visit (HOSPITAL_BASED_OUTPATIENT_CLINIC_OR_DEPARTMENT_OTHER): Payer: Self-pay | Admitting: *Deleted

## 2023-12-24 ENCOUNTER — Other Ambulatory Visit (HOSPITAL_COMMUNITY): Payer: Self-pay

## 2023-12-24 ENCOUNTER — Ambulatory Visit (HOSPITAL_BASED_OUTPATIENT_CLINIC_OR_DEPARTMENT_OTHER): Payer: 59 | Admitting: Nurse Practitioner

## 2023-12-24 ENCOUNTER — Encounter (HOSPITAL_BASED_OUTPATIENT_CLINIC_OR_DEPARTMENT_OTHER): Payer: Self-pay | Admitting: Nurse Practitioner

## 2023-12-24 VITALS — BP 126/80 | HR 66 | Ht 65.0 in | Wt 185.4 lb

## 2023-12-24 DIAGNOSIS — L821 Other seborrheic keratosis: Secondary | ICD-10-CM | POA: Diagnosis not present

## 2023-12-24 DIAGNOSIS — D691 Qualitative platelet defects: Secondary | ICD-10-CM

## 2023-12-24 DIAGNOSIS — D2272 Melanocytic nevi of left lower limb, including hip: Secondary | ICD-10-CM | POA: Diagnosis not present

## 2023-12-24 DIAGNOSIS — D1801 Hemangioma of skin and subcutaneous tissue: Secondary | ICD-10-CM | POA: Diagnosis not present

## 2023-12-24 DIAGNOSIS — L720 Epidermal cyst: Secondary | ICD-10-CM | POA: Diagnosis not present

## 2023-12-24 DIAGNOSIS — E785 Hyperlipidemia, unspecified: Secondary | ICD-10-CM | POA: Diagnosis not present

## 2023-12-24 DIAGNOSIS — L814 Other melanin hyperpigmentation: Secondary | ICD-10-CM | POA: Diagnosis not present

## 2023-12-24 DIAGNOSIS — R072 Precordial pain: Secondary | ICD-10-CM

## 2023-12-24 DIAGNOSIS — D225 Melanocytic nevi of trunk: Secondary | ICD-10-CM | POA: Diagnosis not present

## 2023-12-24 DIAGNOSIS — R931 Abnormal findings on diagnostic imaging of heart and coronary circulation: Secondary | ICD-10-CM

## 2023-12-24 DIAGNOSIS — D2262 Melanocytic nevi of left upper limb, including shoulder: Secondary | ICD-10-CM | POA: Diagnosis not present

## 2023-12-24 DIAGNOSIS — R002 Palpitations: Secondary | ICD-10-CM

## 2023-12-24 DIAGNOSIS — Z7189 Other specified counseling: Secondary | ICD-10-CM

## 2023-12-24 DIAGNOSIS — I2510 Atherosclerotic heart disease of native coronary artery without angina pectoris: Secondary | ICD-10-CM

## 2023-12-24 DIAGNOSIS — D2222 Melanocytic nevi of left ear and external auricular canal: Secondary | ICD-10-CM | POA: Diagnosis not present

## 2023-12-24 DIAGNOSIS — D2271 Melanocytic nevi of right lower limb, including hip: Secondary | ICD-10-CM | POA: Diagnosis not present

## 2023-12-24 MED ORDER — METOPROLOL TARTRATE 100 MG PO TABS
100.0000 mg | ORAL_TABLET | Freq: Once | ORAL | 0 refills | Status: DC
  Filled 2023-12-24 – 2024-01-12 (×2): qty 1, 1d supply, fill #0

## 2023-12-24 NOTE — Patient Instructions (Signed)
 Medication Instructions:   Our team will contact you to find out about what statin you can take if any.  *If you need a refill on your cardiac medications before your next appointment, please call your pharmacy*   Lab Work:  Your physician recommends that you return for a FASTING NMR/LPA/ALT, fasting after midnight ONLY if you start statin medication. You can go to any lab corp listed below. Patient given paperwork today.         If you have labs (blood work) drawn today and your tests are completely normal, you will receive your results only by: MyChart Message (if you have MyChart) OR A paper copy in the mail If you have any lab test that is abnormal or we need to change your treatment, we will call you to review the results.   Testing/Procedures:    Your cardiac CT will be scheduled at one of the below locations:   [facility] [address] [place], [place] [address] [phone]  If scheduled at [facility], please arrive at the [facility] En[facility]ance (Entrance C2) of [facility] 30 minutes prior to test start time. You can use the FREE valet parking offered at entrance C (encouraged to control the heart rate for the test)  Proceed to the [facility] Radiology Department (first floor) to check-in and test prep.  All radiology patients and guests should use entrance C2 at [facility], accessed from [facility], even though the hospital's physical address listed is [address].      Please follow these instructions carefully (unless otherwise directed):  An IV will be required for this test and Nitroglycerin will be given.    On the Night Before the Test: Be sure to Drink plenty of water. Do not consume any caffeinated/decaffeinated beverages or chocolate 12 hours prior to your test. Do not take any antihistamines 12 hours prior to your test.  On the Day of the Test: Drink plenty of water  until 1 hour prior to the test. Do not eat any food 1 hour prior to test. You may take your regular medications prior to the test.  Take metoprolol (Lopressor) one (1) tablet by mouth ( 100 mg)  two hours prior to test. FEMALES- please wear underwire-free bra if available, avoid dresses & tight clothing      After the Test: Drink plenty of water. After receiving IV contrast, you may experience a mild flushed feeling. This is normal. On occasion, you may experience a mild rash up to 24 hours after the test. This is not dangerous. If this occurs, you can take Benadryl 25 mg, Zyrtec, Claritin, or Allegra and increase your fluid intake. (Patients taking Tikosyn should avoid Benadryl, and may take Zyrtec, Claritin, or Allegra) If you experience trouble breathing, this can be serious. If it is severe call 911 IMMEDIATELY. If it is mild, please call our office.  We will call to schedule your test 2-4 weeks out understanding that some insurance companies will need an authorization prior to the service being performed.   For more information and frequently asked questions, please visit our website : [email or web address]  For non-scheduling related questions, please contact the cardiac imaging nurse navigator should you have any questions/concerns: Cardiac Imaging Nurse Navigators Direct Office Dial: [phone]   For scheduling needs, including cancellations and rescheduling, please call [place], [phone].    Follow-Up: At [facility], you and your health needs are our priority.  As part of our continuing mission to provide you with exceptional heart care, we have created designated Provider Care Teams.  These Care Teams include your primary Cardiologist (physician) and Advanced Practice Providers (APPs -  Physician Assistants and Nurse Practitioners) who all work together to provide you with the care you need, when you need it.  We recommend signing up for the patient  portal called "MyChart".  Sign up information is provided on this After Visit Summary.  MyChart is used to connect with patients for Virtual Visits (Telemedicine).  Patients are able to view lab/test results, encounter notes, upcoming appointments, etc.  Non-urgent messages can be sent to your provider as well.   To learn more about what you can do with MyChart, go to [email or web address].    Your next appointment:   2 month(s)  Provider:   Eligha Bridegroom, NP

## 2023-12-30 ENCOUNTER — Encounter (HOSPITAL_BASED_OUTPATIENT_CLINIC_OR_DEPARTMENT_OTHER): Payer: Self-pay

## 2024-01-02 NOTE — Telephone Encounter (Signed)
**Note De-identified Hilliary Jock Obfuscation** Please advise 

## 2024-01-05 ENCOUNTER — Other Ambulatory Visit (HOSPITAL_COMMUNITY): Payer: Self-pay

## 2024-01-12 ENCOUNTER — Other Ambulatory Visit (HOSPITAL_COMMUNITY): Payer: Self-pay

## 2024-01-12 ENCOUNTER — Telehealth (HOSPITAL_COMMUNITY): Payer: Self-pay | Admitting: *Deleted

## 2024-01-12 NOTE — Telephone Encounter (Signed)
 Reaching out to patient to offer assistance regarding upcoming cardiac imaging study; pt verbalizes understanding of appt date/time, parking situation and where to check in, pre-test NPO status and medications ordered, and verified current allergies; name and call back number provided for further questions should they arise Johney Frame RN Navigator Cardiac Imaging [facility] [phone] office [phone] cell

## 2024-01-13 ENCOUNTER — Ambulatory Visit (HOSPITAL_COMMUNITY)
Admission: RE | Admit: 2024-01-13 | Discharge: 2024-01-13 | Disposition: A | Discharge status: Home / Self Care | Payer: 59 | Source: Ambulatory Visit | Attending: Nurse Practitioner | Admitting: Nurse Practitioner

## 2024-01-13 DIAGNOSIS — R072 Precordial pain: Secondary | ICD-10-CM

## 2024-01-13 MED ORDER — IOHEXOL 350 MG/ML SOLN
100.0000 mL | Freq: Once | INTRAVENOUS | Status: AC | PRN
  Administered 2024-01-13: 100 mL via INTRAVENOUS

## 2024-01-13 MED ORDER — NITROGLYCERIN 0.4 MG SL SUBL
SUBLINGUAL_TABLET | SUBLINGUAL | Status: AC
  Filled 2024-01-13: qty 2

## 2024-01-13 MED ORDER — NITROGLYCERIN 0.4 MG SL SUBL
0.8000 mg | SUBLINGUAL_TABLET | Freq: Once | SUBLINGUAL | Status: AC
  Administered 2024-01-13: 0.8 mg via SUBLINGUAL

## 2024-01-13 NOTE — Progress Notes (Signed)
 Patient tolerated CT well. Vital signs stable encourage to drink water throughout day.Reasons explained and verbalized understanding. Ambulated steady gait.

## 2024-01-14 ENCOUNTER — Encounter (HOSPITAL_BASED_OUTPATIENT_CLINIC_OR_DEPARTMENT_OTHER): Payer: Self-pay

## 2024-03-03 ENCOUNTER — Ambulatory Visit (HOSPITAL_BASED_OUTPATIENT_CLINIC_OR_DEPARTMENT_OTHER): Payer: 59 | Admitting: Nurse Practitioner

## 2024-03-23 ENCOUNTER — Ambulatory Visit: Admitting: Cardiovascular Disease

## 2024-05-03 ENCOUNTER — Ambulatory Visit: Attending: Cardiovascular Disease | Admitting: Cardiovascular Disease

## 2024-05-03 ENCOUNTER — Encounter: Payer: Self-pay | Admitting: Cardiovascular Disease

## 2024-05-03 VITALS — BP 134/88 | HR 75 | Ht 65.0 in | Wt 185.4 lb

## 2024-05-03 DIAGNOSIS — E782 Mixed hyperlipidemia: Secondary | ICD-10-CM | POA: Diagnosis not present

## 2024-05-03 DIAGNOSIS — R931 Abnormal findings on diagnostic imaging of heart and coronary circulation: Secondary | ICD-10-CM | POA: Insufficient documentation

## 2024-05-03 DIAGNOSIS — E785 Hyperlipidemia, unspecified: Secondary | ICD-10-CM | POA: Insufficient documentation

## 2024-05-03 NOTE — Progress Notes (Signed)
 05/03/2024 Deanna [name]   08/23/1966  [id number]  Primary Physician Copland, Harlene BROCKS, MD Primary Cardiologist: Dorn JINNY Lesches MD GENI CODY MADEIRA, FSCAI  HPI:  Deanna [name] is a 58 y.o. 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  She is being seen to discuss her coronary CTA and risk factors.  She was recently seen by Rosaline Binning, NP 12/24/2023.  Her risk factors include family history with mother who had an MI in her 62s and 2 siblings who died of myocardial infarction's.  She does have a history of untreated hyperlipidemia.  She is never had a heart attack or stroke.  She is very active and denies chest pain or shortness of breath although she is less active than she was several years ago.  She does admit to being under a lot of stress at work.  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   Current Meds  Medication Sig   aminocaproic  acid (AMICAR ) 500 MG tablet    Cholecalciferol (VITAMIN D3) 1000 units CAPS Take 5,000 mg by mouth daily.   nitrofurantoin , macrocrystal-monohydrate, (MACROBID ) 100 MG capsule Take 1 capsule (100 mg total) by mouth as needed to prevent urinary tract infection after intercourse     Allergies  Allergen Reactions   Aspirin Other (See Comments)    ASA contraindicated with bleeding disorder   [place] [Lifitegrast]     Social History   Socioeconomic History   Marital status: Divorced    Spouse name: Not on file   Number of children: 2   Years of education: AS   Highest education level: Tax adviser degree: occupational, Scientist, product/process development, or vocational program  Occupational History    Employer: [employer]    Comment: Engineer, manufacturing for hospitalist  Tobacco Use   Smoking status: Never   Smokeless tobacco: Never  Vaping Use    Vaping status: Never Used  Substance and Sexual Activity   Alcohol use: Yes    Comment: 2 drinks per week    Drug use: No   Sexual activity: Yes    Partners: Male    Birth control/protection: None    Comment: Engaged  Other Topics Concern   Not on file  Social History Narrative   Marital status: married x 23 years; happily married; no abuse. Update 08/06/2023 has fiance'       Children: 2 children (19, 17); no grandchildren.      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      Tobacco:  None      Alcohol: none      Drugs: none      Exercise:  Walking, strength training daily.      Seatbelt: 100%      Guns: locked up        Sunscreen:  SPF 30.   Patient is right handed.   Patient drinks 2 cups caffeine daily.   Social Drivers of Corporate investment banker Strain: Low Risk  (07/18/2023)   Overall Financial Resource Strain (CARDIA)    Difficulty of Paying Living Expenses: Not hard at all  Food Insecurity: No Food Insecurity (07/18/2023)   Hunger Vital Sign    Worried About Running Out of Food in the Last Year: Never true    Ran Out of  Food in the Last Year: Never true  Transportation Needs: No Transportation Needs (07/18/2023)   PRAPARE - Administrator, Civil Service (Medical): No    Lack of Transportation (Non-Medical): No  Physical Activity: Insufficiently Active (07/18/2023)   Exercise Vital Sign    Days of Exercise per Week: 3 days    Minutes of Exercise per Session: 30 min  Stress: Stress Concern Present (07/18/2023)   [employer] of Occupational Health - Occupational Stress Questionnaire    Feeling of Stress : Rather much  Social Connections: Moderately Integrated (07/18/2023)   Social Connection and Isolation Panel    Frequency of Communication with Friends and Family: More than three times a week    Frequency of Social Gatherings with Friends and Family: Once a week    Attends Religious Services: More than 4 times per year    Active  Member of [employer] or Organizations: Yes    Attends Engineer, structural: More than 4 times per year    Marital Status: Divorced  Catering manager Violence: Not on file     Review of Systems: General: negative for chills, fever, night sweats or weight changes.  Cardiovascular: negative for chest pain, dyspnea on exertion, edema, orthopnea, palpitations, paroxysmal nocturnal dyspnea or shortness of breath Dermatological: negative for rash Respiratory: negative for cough or wheezing Urologic: negative for hematuria Abdominal: negative for nausea, vomiting, diarrhea, bright red blood per rectum, melena, or hematemesis Neurologic: negative for visual changes, syncope, or dizziness All other systems reviewed and are otherwise negative except as noted above.    Blood pressure 134/88, pulse 75, height 5' 5 (1.651 m), weight 185 lb 6.4 oz (84.1 kg), SpO2 99%.  General appearance: alert and no distress Neck: no adenopathy, no carotid bruit, no JVD, supple, symmetrical, trachea midline, and thyroid  not enlarged, symmetric, no tenderness/mass/nodules Lungs: clear to auscultation bilaterally Heart: regular rate and rhythm, S1, S2 normal, no murmur, click, rub or gallop Extremities: extremities normal, atraumatic, no cyanosis or edema Pulses: 2+ and symmetric Skin: Skin color, texture, turgor normal. No rashes or lesions Neurologic: Grossly normal  EKG not performed today      ASSESSMENT AND PLAN:   Hyperlipidemia History of hyperlipidemia not on statin therapy with lipid profile performed 08/27/2023 revealing total cholesterol 246, LDL 144 and HDL of 83.  She does have an elevated coronary calcium  score.  LDL goal less than 70.  Apparently she she has a rare platelet disorder and her hematologist mentioned that statins were contraindicated.  I asked her to reach out to her hematologist to further discuss this.  If she is able to take a statin we will start 1, if not we will refer to a  Pharm.D. to initiate PCSK9 therapy.  Elevated coronary artery calcium  score Coronary calcium  score performed 11/22/2023 was 334.  A subsequent CTA performed 01/13/2024 did not show obstructive disease.  She is fairly active and has no symptoms.  We talked about risk factor modification including addressing aggressively her hyperlipidemia.     Dorn DOROTHA Lesches MD FACP,FACC,FAHA, [facility] 05/03/2024 4:52 PM

## 2024-05-03 NOTE — Assessment & Plan Note (Signed)
 Coronary calcium  score performed 11/22/2023 was 334.  A subsequent CTA performed 01/13/2024 did not show obstructive disease.  She is fairly active and has no symptoms.  We talked about risk factor modification including addressing aggressively her hyperlipidemia.

## 2024-05-03 NOTE — Assessment & Plan Note (Signed)
 History of hyperlipidemia not on statin therapy with lipid profile performed 08/27/2023 revealing total cholesterol 246, LDL 144 and HDL of 83.  She does have an elevated coronary calcium  score.  LDL goal less than 70.  Apparently she she has a rare platelet disorder and her hematologist mentioned that statins were contraindicated.  I asked her to reach out to her hematologist to further discuss this.  If she is able to take a statin we will start 1, if not we will refer to a Pharm.D. to initiate PCSK9 therapy.

## 2024-05-03 NOTE — Patient Instructions (Signed)
 Medication Instructions:   No changes *If you need a refill on your cardiac medications before your next appointment, please call your pharmacy*   Lab Work: Not  needed    Testing/Procedures: Not needed   Follow-Up: At [facility], you and your health needs are our priority.  As part of our continuing mission to provide you with exceptional heart care, we have created designated Provider Care Teams.  These Care Teams include your primary Cardiologist (physician) and Advanced Practice Providers (APPs -  Physician Assistants and Nurse Practitioners) who all work together to provide you with the care you need, when you need it.     Your next appointment:   6 month(s)  The format for your next appointment:   In Person  Provider:   Dr Dorn Lesches

## 2024-05-12 ENCOUNTER — Encounter: Payer: Self-pay | Admitting: Family Medicine

## 2024-05-13 ENCOUNTER — Other Ambulatory Visit (HOSPITAL_COMMUNITY): Payer: Self-pay

## 2024-05-13 DIAGNOSIS — H43391 Other vitreous opacities, right eye: Secondary | ICD-10-CM | POA: Diagnosis not present

## 2024-05-13 MED ORDER — MOXIFLOXACIN HCL 0.5 % OP SOLN
1.0000 [drp] | Freq: Three times a day (TID) | OPHTHALMIC | 0 refills | Status: AC
  Filled 2024-05-13: qty 3, 20d supply, fill #0

## 2024-05-18 ENCOUNTER — Encounter: Payer: Self-pay | Admitting: Family Medicine

## 2024-06-04 DIAGNOSIS — H5203 Hypermetropia, bilateral: Secondary | ICD-10-CM | POA: Diagnosis not present

## 2024-09-16 ENCOUNTER — Other Ambulatory Visit: Payer: Self-pay | Admitting: Family Medicine

## 2024-09-16 DIAGNOSIS — Z1231 Encounter for screening mammogram for malignant neoplasm of breast: Secondary | ICD-10-CM

## 2024-10-11 ENCOUNTER — Ambulatory Visit: Admission: RE | Admit: 2024-10-11 | Discharge: 2024-10-11 | Disposition: A | Source: Ambulatory Visit

## 2024-10-11 DIAGNOSIS — Z1231 Encounter for screening mammogram for malignant neoplasm of breast: Secondary | ICD-10-CM | POA: Diagnosis not present

## 2024-10-19 ENCOUNTER — Ambulatory Visit: Admitting: Cardiovascular Disease
